# Patient Record
Sex: Female | Born: 1945 | Race: White | Hispanic: No | Marital: Married | Smoking: Never smoker
Health system: Southern US, Community
[De-identification: ages and names within clinical notes are randomized; demographics above are authoritative.]

## PROBLEM LIST (undated history)

## (undated) DIAGNOSIS — E119 Type 2 diabetes mellitus without complications: Secondary | ICD-10-CM

## (undated) DIAGNOSIS — M1990 Unspecified osteoarthritis, unspecified site: Secondary | ICD-10-CM

## (undated) DIAGNOSIS — E559 Vitamin D deficiency, unspecified: Secondary | ICD-10-CM

## (undated) DIAGNOSIS — I10 Essential (primary) hypertension: Secondary | ICD-10-CM

## (undated) DIAGNOSIS — Z9989 Dependence on other enabling machines and devices: Secondary | ICD-10-CM

## (undated) DIAGNOSIS — E785 Hyperlipidemia, unspecified: Secondary | ICD-10-CM

## (undated) DIAGNOSIS — R739 Hyperglycemia, unspecified: Secondary | ICD-10-CM

## (undated) DIAGNOSIS — I4892 Unspecified atrial flutter: Secondary | ICD-10-CM

## (undated) DIAGNOSIS — G629 Polyneuropathy, unspecified: Secondary | ICD-10-CM

## (undated) DIAGNOSIS — J45909 Unspecified asthma, uncomplicated: Secondary | ICD-10-CM

## (undated) DIAGNOSIS — J302 Other seasonal allergic rhinitis: Secondary | ICD-10-CM

## (undated) DIAGNOSIS — R011 Cardiac murmur, unspecified: Secondary | ICD-10-CM

## (undated) DIAGNOSIS — R32 Unspecified urinary incontinence: Secondary | ICD-10-CM

## (undated) DIAGNOSIS — R0602 Shortness of breath: Secondary | ICD-10-CM

## (undated) DIAGNOSIS — C679 Malignant neoplasm of bladder, unspecified: Secondary | ICD-10-CM

## (undated) DIAGNOSIS — Z8679 Personal history of other diseases of the circulatory system: Secondary | ICD-10-CM

## (undated) DIAGNOSIS — K5790 Diverticulosis of intestine, part unspecified, without perforation or abscess without bleeding: Secondary | ICD-10-CM

## (undated) DIAGNOSIS — Z87828 Personal history of other (healed) physical injury and trauma: Secondary | ICD-10-CM

## (undated) DIAGNOSIS — G4733 Obstructive sleep apnea (adult) (pediatric): Secondary | ICD-10-CM

## (undated) DIAGNOSIS — Z8709 Personal history of other diseases of the respiratory system: Secondary | ICD-10-CM

## (undated) DIAGNOSIS — I8390 Asymptomatic varicose veins of unspecified lower extremity: Secondary | ICD-10-CM

## (undated) DIAGNOSIS — C801 Malignant (primary) neoplasm, unspecified: Secondary | ICD-10-CM

## (undated) DIAGNOSIS — Z974 Presence of external hearing-aid: Secondary | ICD-10-CM

## (undated) HISTORY — DX: Vitamin D deficiency, unspecified: E55.9

## (undated) HISTORY — PX: TRANSURETHRAL RESECTION OF BLADDER TUMOR: SHX2575

## (undated) HISTORY — PX: COLONOSCOPY: SHX174

## (undated) HISTORY — DX: Other seasonal allergic rhinitis: J30.2

## (undated) HISTORY — DX: Hyperlipidemia, unspecified: E78.5

## (undated) HISTORY — PX: CYSTOSCOPY: SUR368

## (undated) HISTORY — DX: Diverticulosis of intestine, part unspecified, without perforation or abscess without bleeding: K57.90

## (undated) HISTORY — DX: Unspecified asthma, uncomplicated: J45.909

## (undated) HISTORY — DX: Asymptomatic varicose veins of unspecified lower extremity: I83.90

---

## 1986-03-02 HISTORY — PX: VAGINAL HYSTERECTOMY: SHX2639

## 1988-10-31 HISTORY — PX: ABDOMINAL HYSTERECTOMY: SHX81

## 1998-06-27 ENCOUNTER — Other Ambulatory Visit: Admission: RE | Admit: 1998-06-27 | Discharge: 1998-06-27 | Payer: Self-pay | Admitting: Obstetrics and Gynecology

## 1999-06-30 ENCOUNTER — Other Ambulatory Visit: Admission: RE | Admit: 1999-06-30 | Discharge: 1999-06-30 | Payer: Self-pay | Admitting: Obstetrics and Gynecology

## 2000-06-21 ENCOUNTER — Encounter: Payer: Self-pay | Admitting: Family Medicine

## 2000-06-21 ENCOUNTER — Encounter: Admission: RE | Admit: 2000-06-21 | Discharge: 2000-06-21 | Payer: Self-pay | Admitting: Family Medicine

## 2000-07-14 ENCOUNTER — Other Ambulatory Visit: Admission: RE | Admit: 2000-07-14 | Discharge: 2000-07-14 | Payer: Self-pay | Admitting: Obstetrics and Gynecology

## 2001-08-19 ENCOUNTER — Other Ambulatory Visit: Admission: RE | Admit: 2001-08-19 | Discharge: 2001-08-19 | Payer: Self-pay | Admitting: Obstetrics and Gynecology

## 2002-08-01 DIAGNOSIS — Z8551 Personal history of malignant neoplasm of bladder: Secondary | ICD-10-CM

## 2002-08-01 HISTORY — DX: Personal history of malignant neoplasm of bladder: Z85.51

## 2002-08-09 ENCOUNTER — Encounter (INDEPENDENT_AMBULATORY_CARE_PROVIDER_SITE_OTHER): Payer: Self-pay | Admitting: Specialist

## 2002-08-09 ENCOUNTER — Ambulatory Visit (HOSPITAL_BASED_OUTPATIENT_CLINIC_OR_DEPARTMENT_OTHER): Admission: RE | Admit: 2002-08-09 | Discharge: 2002-08-09 | Payer: Self-pay | Admitting: Urology

## 2002-08-09 HISTORY — PX: CYSTOSTOMY W/ BLADDER BIOPSY: SHX1431

## 2006-05-10 ENCOUNTER — Ambulatory Visit: Payer: Self-pay | Admitting: Emergency Medicine

## 2006-06-09 ENCOUNTER — Ambulatory Visit: Payer: Self-pay | Admitting: Emergency Medicine

## 2006-06-18 ENCOUNTER — Ambulatory Visit: Payer: Self-pay | Admitting: Emergency Medicine

## 2007-01-04 DIAGNOSIS — R05 Cough: Secondary | ICD-10-CM

## 2007-01-04 DIAGNOSIS — J309 Allergic rhinitis, unspecified: Secondary | ICD-10-CM | POA: Insufficient documentation

## 2007-01-04 DIAGNOSIS — E785 Hyperlipidemia, unspecified: Secondary | ICD-10-CM

## 2007-01-04 DIAGNOSIS — R059 Cough, unspecified: Secondary | ICD-10-CM | POA: Insufficient documentation

## 2007-01-04 DIAGNOSIS — Z9079 Acquired absence of other genital organ(s): Secondary | ICD-10-CM | POA: Insufficient documentation

## 2007-01-04 DIAGNOSIS — J45909 Unspecified asthma, uncomplicated: Secondary | ICD-10-CM | POA: Insufficient documentation

## 2007-09-05 DIAGNOSIS — R0989 Other specified symptoms and signs involving the circulatory and respiratory systems: Secondary | ICD-10-CM

## 2007-09-05 DIAGNOSIS — K5730 Diverticulosis of large intestine without perforation or abscess without bleeding: Secondary | ICD-10-CM | POA: Insufficient documentation

## 2008-03-02 DIAGNOSIS — I447 Left bundle-branch block, unspecified: Secondary | ICD-10-CM

## 2008-03-02 HISTORY — DX: Left bundle-branch block, unspecified: I44.7

## 2008-03-21 DIAGNOSIS — I872 Venous insufficiency (chronic) (peripheral): Secondary | ICD-10-CM | POA: Insufficient documentation

## 2008-05-08 ENCOUNTER — Ambulatory Visit: Payer: Self-pay | Admitting: Cardiology

## 2008-05-08 LAB — CONVERTED CEMR LAB
BUN: 17 mg/dL (ref 6–23)
CO2: 30 meq/L (ref 19–32)
Calcium: 9.1 mg/dL (ref 8.4–10.5)
Chloride: 103 meq/L (ref 96–112)
Creatinine, Ser: 0.7 mg/dL (ref 0.4–1.2)
GFR calc Af Amer: 108 mL/min
GFR calc non Af Amer: 90 mL/min
Glucose, Bld: 91 mg/dL (ref 70–99)
Magnesium: 2.1 mg/dL (ref 1.5–2.5)
Potassium: 3.5 meq/L (ref 3.5–5.1)
Sodium: 141 meq/L (ref 135–145)

## 2008-05-16 ENCOUNTER — Ambulatory Visit: Payer: Self-pay

## 2008-05-16 ENCOUNTER — Encounter: Payer: Self-pay | Admitting: Cardiology

## 2008-05-28 DIAGNOSIS — C679 Malignant neoplasm of bladder, unspecified: Secondary | ICD-10-CM | POA: Insufficient documentation

## 2008-05-28 DIAGNOSIS — I119 Hypertensive heart disease without heart failure: Secondary | ICD-10-CM

## 2008-08-01 ENCOUNTER — Ambulatory Visit (HOSPITAL_COMMUNITY): Admission: RE | Admit: 2008-08-01 | Discharge: 2008-08-01 | Payer: Self-pay | Admitting: Cardiology

## 2008-08-01 ENCOUNTER — Ambulatory Visit: Payer: Self-pay | Admitting: Cardiology

## 2008-08-01 DIAGNOSIS — R0602 Shortness of breath: Secondary | ICD-10-CM

## 2008-08-01 DIAGNOSIS — R002 Palpitations: Secondary | ICD-10-CM | POA: Insufficient documentation

## 2008-08-06 ENCOUNTER — Encounter: Payer: Self-pay | Admitting: Cardiology

## 2008-08-06 LAB — CONVERTED CEMR LAB
BUN: 15 mg/dL (ref 6–23)
CO2: 32 meq/L (ref 19–32)
Calcium: 9.3 mg/dL (ref 8.4–10.5)
Chloride: 109 meq/L (ref 96–112)
Creatinine, Ser: 0.7 mg/dL (ref 0.4–1.2)
GFR calc non Af Amer: 89.2 mL/min (ref >60)
Glucose, Bld: 109 mg/dL — ABNORMAL HIGH (ref 70–99)
Potassium: 3.6 meq/L (ref 3.5–5.1)
Sodium: 143 meq/L (ref 135–145)

## 2008-08-13 ENCOUNTER — Encounter: Payer: Self-pay | Admitting: Nurse Practitioner

## 2008-08-31 ENCOUNTER — Telehealth (INDEPENDENT_AMBULATORY_CARE_PROVIDER_SITE_OTHER): Payer: Self-pay | Admitting: *Deleted

## 2009-01-11 ENCOUNTER — Encounter (INDEPENDENT_AMBULATORY_CARE_PROVIDER_SITE_OTHER): Payer: Self-pay | Admitting: *Deleted

## 2009-01-17 ENCOUNTER — Ambulatory Visit: Payer: Self-pay | Admitting: Cardiology

## 2009-01-17 DIAGNOSIS — R079 Chest pain, unspecified: Secondary | ICD-10-CM

## 2009-04-26 DIAGNOSIS — G4700 Insomnia, unspecified: Secondary | ICD-10-CM

## 2009-06-05 ENCOUNTER — Ambulatory Visit: Payer: Self-pay | Admitting: Cardiology

## 2009-06-10 ENCOUNTER — Encounter: Payer: Self-pay | Admitting: Cardiology

## 2009-11-15 DIAGNOSIS — F329 Major depressive disorder, single episode, unspecified: Secondary | ICD-10-CM

## 2009-11-15 DIAGNOSIS — F411 Generalized anxiety disorder: Secondary | ICD-10-CM

## 2009-12-19 ENCOUNTER — Encounter: Payer: Self-pay | Admitting: Cardiology

## 2009-12-19 ENCOUNTER — Ambulatory Visit: Payer: Self-pay | Admitting: Cardiology

## 2009-12-19 DIAGNOSIS — R9431 Abnormal electrocardiogram [ECG] [EKG]: Secondary | ICD-10-CM

## 2009-12-19 DIAGNOSIS — I6522 Occlusion and stenosis of left carotid artery: Secondary | ICD-10-CM | POA: Insufficient documentation

## 2010-01-02 ENCOUNTER — Ambulatory Visit: Payer: Self-pay

## 2010-01-02 ENCOUNTER — Encounter: Payer: Self-pay | Admitting: Cardiology

## 2010-02-17 DIAGNOSIS — M949 Disorder of cartilage, unspecified: Secondary | ICD-10-CM

## 2010-02-17 DIAGNOSIS — E559 Vitamin D deficiency, unspecified: Secondary | ICD-10-CM

## 2010-02-17 DIAGNOSIS — R7301 Impaired fasting glucose: Secondary | ICD-10-CM | POA: Insufficient documentation

## 2010-02-17 DIAGNOSIS — I10 Essential (primary) hypertension: Secondary | ICD-10-CM

## 2010-02-17 DIAGNOSIS — M899 Disorder of bone, unspecified: Secondary | ICD-10-CM

## 2010-03-13 ENCOUNTER — Encounter: Payer: Self-pay | Admitting: Gastroenterology

## 2010-03-23 ENCOUNTER — Encounter: Payer: Self-pay | Admitting: Family Medicine

## 2010-04-03 NOTE — Assessment & Plan Note (Signed)
Summary: f45m   Visit Type:  3 MONTHS FOLLOW UP  CC:  No cardiac complains.  History of Present Illness: Despite the time of year, her symptoms are improved.  She restarted allergy shots when we talked about  it in the fall.  She was having some swelling the feet.    Current Medications (verified): 1)  Allergy Shot .... Once Weekly 2)  Albuterol 90 Mcg/act  Aers (Albuterol) .... Inhale 2 Puffs Every 4 Hrs As Needed 3)  Lipitor 40 Mg Tabs (Atorvastatin Calcium) .... Take One Tablet By Mouth Daily. 4)  Fosamax 70 Mg Tabs (Alendronate Sodium) .... Take One Tablet Once A Week As Directed 5)  Symbicort 160-4.5 Mcg/act Aero (Budesonide-Formoterol Fumarate) .... One Puff in The Morning 6)  Fish Oil 1200 Mg Caps (Omega-3 Fatty Acids) .... Take 1 Capsule By Mouth Once A Day 7)  Hydrochlorothiazide 25 Mg Tabs (Hydrochlorothiazide) .... Take One Tablet By Mouth Daily. 8)  Nasal Saline Wash .... As Needed 9)  Potassium Chloride Cr 10 Meq Cr-Caps (Potassium Chloride) .... Take One Tablet By Mouth Daily 10)  Vitamin D3 2000 Unit Caps (Cholecalciferol) .... Take 1 Capsule By Mouth Once A Day 11)  One-A-Day Calcium Plus 500-100-50 Mg-Unit-Mg Chew (Calcium-Magnesium-Vitamin D) .... Once A Day  Allergies: 1)  ! Codeine  Past History:  Past Medical History: Last updated: 2008-06-24 BLADDER CANCER (ICD-188.9) HYPERTENSION, BENIGN (ICD-401.1) HYPERLIPIDEMIA (ICD-272.4) COUGH, CHRONIC (ICD-786.2) HYSTERECTOMY, HX OF (ICD-V45.77) ASTHMA (ICD-493.90) ALLERGIC RHINITIS (ICD-477.9)  Past Surgical History: Last updated: 2008-06-24 hysterectomy-partial 1985  Family History: Last updated: 2008-06-24 Father: Deceased 18, heart disease, kidney failure Mother:Deceased 70, brain tumor Siblings: 2 sisters- 1 deceased 73 (unknown), 1 alive  Social History: Last updated: 06-24-2008 Retired  Married  Tobacco Use - No.  Alcohol Use - no Regular Exercise - no Drug Use - no  Risk Factors: Exercise:  no (2008-06-24)  Risk Factors: Smoking Status: never (2008-06-24)  Vital Signs:  Patient profile:   65 year old female Height:      68 inches Weight:      173 pounds BMI:     26.40 Pulse rate:   86 / minute Pulse rhythm:   regular Resp:     18 per minute BP sitting:   118 / 76  (left arm) Cuff size:   large  Vitals Entered By: Vikki Ports (June 05, 2009 11:01 AM)  Physical Exam  General:  Well developed, well nourished, in no acute distress. Head:  normocephalic and atraumatic Eyes:  PERRLA/EOM intact; conjunctiva and lids normal. Lungs:  Clear bilaterally to auscultation and percussion. Heart:  Normal S1 and S2.  Without definite murmur. Extremities:  No clubbing or cyanosis. Neurologic:  Alert and oriented x 3.   EKG  Procedure date:  06/05/2009  Findings:      NSR.  LBBB.  Impression & Recommendations:  Problem # 1:  CHEST PAIN-UNSPECIFIED (ICD-786.50)  None whatsoever.  Orders: EKG w/ Interpretation (93000)  Problem # 2:  HYPERLIPIDEMIA (ICD-272.4) Currently on therapy Her updated medication list for this problem includes:    Lipitor 40 Mg Tabs (Atorvastatin calcium) .Marland Kitchen... Take one tablet by mouth daily.  Problem # 3:  ASTHMA (ICD-493.90) symptoms are improved. Her updated medication list for this problem includes:    Albuterol 90 Mcg/act Aers (Albuterol) ..... Inhale 2 puffs every 4 hrs as needed    Symbicort 160-4.5 Mcg/act Aero (Budesonide-formoterol fumarate) ..... One puff in the morning  Patient Instructions: 1)  Your physician recommends  that you continue on your current medications as directed. Please refer to the Current Medication list given to you today. 2)  Your physician wants you to follow-up in:  6 MONTHS.  You will receive a reminder letter in the mail two months in advance. If you don't receive a letter, please call our office to schedule the follow-up appointment.

## 2010-04-03 NOTE — Letter (Signed)
Summary: Colonoscopy Date Change Letter  [facility]  [address] [place], [place] [address]   Phone: [phone]  Fax: [phone]      March 13, 2010 MRN: [id number]   Marcia Norris [address] [place], [place]  [address]   Dear [name],   Previously you were recommended to have a repeat colonoscopy around this time. Your chart was recently reviewed by DR.Sharai Overbay of [facility]. Follow up colonoscopy is now recommended in JAN 2015. This revised recommendation is based on current, nationally recognized guidelines for colorectal cancer screening and polyp surveillance. These guidelines are endorsed by the American Cancer Society, [employer] on Colorectal Cancer as well as numerous other major medical organizations.  Please understand that our recommendation assumes that you do not have any new symptoms such as bleeding, a change in bowel habits, anemia, or significant abdominal discomfort. If you do have any concerning GI symptoms or want to discuss the guideline recommendations, please call to arrange an office visit at your earliest convenience. Otherwise we will keep you in our reminder system and contact you 1-2 months prior to the date listed above to schedule your next colonoscopy.  Thank you,  Barbette Hair. Arlyce Dice, M.D.  [facility] [phone]

## 2010-04-03 NOTE — Assessment & Plan Note (Signed)
Summary: f62m   Visit Type:  6 months follow up Primary Provider:  Elizabeth Palau  CC:  Heart flutte Left arm discomfort.  History of Present Illness: She thinks she is doing ok.  She was not put back on any medication.  Sometimes notes a little bit of swelling.  When she has busy day with young children, she notes a bit of fluttering.  She does not check BP at home.    Current Medications (verified): 1)  Allergy Shot .... Once Weekly 2)  Symbicort 160-4.5 Mcg/act Aero (Budesonide-Formoterol Fumarate) .... Two Times A Day 3)  Lipitor 40 Mg Tabs (Atorvastatin Calcium) .... Take One Tablet By Mouth Daily. 4)  Fish Oil 1200 Mg Caps (Omega-3 Fatty Acids) .... Take 1 Capsule By Mouth Once A Day 5)  Nasal Saline Wash .... As Needed 6)  Vitamin D3 2000 Unit Caps (Cholecalciferol) .... Take 1 Capsule By Mouth Once A Day 7)  One-A-Day Calcium Plus 500-100-50 Mg-Unit-Mg Chew (Calcium-Magnesium-Vitamin D) .... Once A Day 8)  Multivitamins  Tabs (Multiple Vitamin) .... Take 1 Tablet By Mouth Once A Day 9)  Aspirin 81 Mg Tbec (Aspirin) .... About 4 X A Week  Allergies: 1)  ! Codeine  Past History:  Past Medical History: Last updated: 2008/06/06 BLADDER CANCER (ICD-188.9) HYPERTENSION, BENIGN (ICD-401.1) HYPERLIPIDEMIA (ICD-272.4) COUGH, CHRONIC (ICD-786.2) HYSTERECTOMY, HX OF (ICD-V45.77) ASTHMA (ICD-493.90) ALLERGIC RHINITIS (ICD-477.9)  Past Surgical History: Last updated: Jun 06, 2008 hysterectomy-partial 1985  Family History: Last updated: 06-06-08 Father: Deceased 69, heart disease, kidney failure Mother:Deceased 11, brain tumor Siblings: 2 sisters- 1 deceased 19 (unknown), 1 alive  Social History: Last updated: Jun 06, 2008 Retired  Married  Tobacco Use - No.  Alcohol Use - no Regular Exercise - no Drug Use - no  Vital Signs:  Patient profile:   65 year old female Height:      68 inches Weight:      170.50 pounds BMI:     26.02 Pulse rate:   79 / minute Pulse  rhythm:   regular Resp:     18 per minute BP sitting:   134 / 74  (left arm) Cuff size:   large  Vitals Entered By: Vikki Ports (December 19, 2009 10:11 AM)  Physical Exam  General:  Well developed, well nourished, in no acute distress. Head:  normocephalic and atraumatic Eyes:  PERRLA/EOM intact; conjunctiva and lids normal. Neck:  Soft r carotid bruit.  Lungs:  Clear bilaterally to auscultation and percussion. Heart:  Paradoxical S2.  Minimal SEM. Pulses:  pulses normal in all 4 extremities Extremities:  No clubbing or cyanosis. Neurologic:  Alert and oriented x 3.   EKG  Procedure date:  12/19/2009  Findings:      NSR. LBBB  Impression & Recommendations:  Problem # 1:  CAROTID BRUIT (ICD-785.9)  Very soft R bruit.  Will check dopplers.    Orders: EKG w/ Interpretation (93000) Carotid Duplex (Carotid Duplex)  Problem # 2:  HYPERLIPIDEMIA (ICD-272.4) HDL 39 on LLT.  A1C is elevated.  LDL near target.   Her updated medication list for this problem includes:    Lipitor 40 Mg Tabs (Atorvastatin calcium) .Marland Kitchen... Take one tablet by mouth daily.  Problem # 3:  ABNORMAL EKG (ICD-794.31) LBBB on ecg.  Normal LV function overall by echo, and no perfusion defect.   Her updated medication list for this problem includes:    Aspirin 81 Mg Tbec (Aspirin) .Marland Kitchen... About 4 x a week  Patient Instructions: 1)  Your physician  recommends that you continue on your current medications as directed. Please refer to the Current Medication list given to you today. 2)  Your physician has requested that you have a carotid duplex. This test is an ultrasound of the carotid arteries in your neck. It looks at blood flow through these arteries that supply the brain with blood. Allow one hour for this exam. There are no restrictions or special instructions. 3)  Please watch the salt in your diet.  4)  Your physician wants you to follow-up in: 1 YEAR.  You will receive a reminder letter in the mail two  months in advance. If you don't receive a letter, please call our office to schedule the follow-up appointment.

## 2010-04-03 NOTE — Letter (Signed)
Summary: Media Authorization Form  Media Authorization Form   Imported By: Marylou Mccoy 07/19/2009 14:06:37  _____________________________________________________________________  External Attachment:    Type:   Image     Comment:   External Document

## 2010-06-17 DIAGNOSIS — M545 Low back pain, unspecified: Secondary | ICD-10-CM | POA: Insufficient documentation

## 2010-07-15 NOTE — Assessment & Plan Note (Signed)
[name]                            CARDIOLOGY OFFICE NOTE   Marcia Norris, Marcia Norris                      MRN:          [id number]  DATE:05/09/2008                            DOB:          [birth year]    CHIEF COMPLAINT:  Persistent left arm tingling and heaviness.   HISTORY OF PRESENT ILLNESS:  Marcia Norris is a 65 year old female known to me  through the care I take of her husband.  She has requested to see [name].  The patient for while has had some numbness and tingling in the hands.  The distribution of the numbness and tingling appears to be in the first  three fingers, and she readily reports this.  She was placed on a carpal  tunnel splint, and has had largely some resolution of her symptoms.  She  has had some discomfort related to the low back, and has been placed on  Naprosyn by Dr. Newell Coral.  In addition to this, she has had some  discomfort up in the left shoulder and some numbness and tingling  radiating down the left arm.  Because of this, she was concerned, and  subsequently sent for possible cardiac evaluation.  Of note, the patient  also has a history of hypertension, and was placed on  hydrochlorothiazide.  Her major complaint, however, in our understanding  of why she is taking this is that she has some edema in the lower  extremities.  She does not have a typical exertional symptoms.  She was  told that her electrocardiogram was unremarkable.  She has had some very  mild shortness of breath associated with activity, but not a great  increase in pattern from which she has had in the past.  As a result,  she has been referred for further evaluation.  She has not had  significant orthopnea.  She has had mild edema in the lower extremities.   PAST MEDICAL HISTORY:  1. The patient has a history now suggestive of hypertension, treated      with hydrochlorothiazide.  2. Hypercholesterolemia, on lipid lowering therapy.  3. History of bladder  cancer with hematuria and has been followed now      for 5 years with Dr. Isabel Caprice, she had resection.  4. History of hysterectomy.   ALLERGIES:  CODEINE.   MEDICATIONS:  1. Alendronate 70 mg daily.  2. Enteric-coated aspirin 81 mg daily.  3. Calcium D 600 mg p.o. b.i.d.  4. Hydrochlorothiazide 25 mg daily.  5. Lipitor 40 mg daily.  6. Multivitamin daily.  7. Vitamin D daily.   FAMILY HISTORY:  Mother died of brain tumor at 67.  Father had heart  failure and renal insufficiency.  He was a smoker.  He died of 27.  She  has a sister who died at 93 of unknown cause.  She has another sister,  who is in reasonable health.   SOCIAL HISTORY:  The patient is married.  She is a nonsmoker.  She has 2  children.  She is a retired Medical laboratory scientific officer.   REVIEW OF SYSTEMS:  Her eyes and  vision have been satisfactory.  She has  not had cataracts or glaucoma.  Her hearing has been intact.  She has  not had cough, fever, or pleurisy.  She has not had significant  orthopnea.  She has had some lower extremity edema.  She has not had  tachycardia, palpitations, or presyncope.  The patient has no abdominal  complaints.  There has been no melena or hematemesis.  There has been no  medications.  Since treatment for her urinary problem, she has not had  significant bladder symptoms.  She has had some lower extremity edema.  She has not had rash.  There had been no specific neurologic symptoms.  A complete 10-point review of systems is unremarkable.   PHYSICAL EXAMINATION:  GENERAL:  She is alert and oriented in no acute  distress.  She has normal mentation.  She is 5 feet 8 inches, 173  pounds, the temperature is afebrile, respiration is 18, blood pressure  120/78 and equal in both arms bilaterally, pulse is 64 and regular.  HEENT:  The extraocular muscles are intact.  Pupils are equal, round,  and reactive.  NECK:  Relatively supple.  No carotid bruits are appreciated.  They does  appear  to be very mild jugular venous distention but it is hard to be  sure.  Overall estimate would be about 8 cm of water.  LUNG:  Fields are clear to auscultation and percussion.  The PMI is  nondisplaced.  There is a paradoxical splitting of the second heart  sound without significant murmur, rub, or gallop.  ABDOMEN:  Soft without any obvious hepatosplenomegaly noted.  EXTREMITIES:  Femoral pulses are intact.  Joints are without erythema.  The extremities do reveal trace edema, and venous stasis changes,  particularly at the feet.  Distal pulses are intact.  NEUROLOGIC:  Intact cranial nerves.  She has good strength which is  equal bilaterally.  She clearly points to the first three and a half  fingers is the location where numbness in her hands.   EKG reveals normal sinus rhythm.  There is an equivocal left bundle  branch block.  The patient says she was previously told EKG was normal.   IMPRESSION:  1. Atypical left upper chest and arm tingling.  2. 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  3. Left bundle branch block of uncertain duration.  4. Mild lower extremity edema.  5. Hypertension, recently started on hydrochlorothiazide.  6. Hypercholesterolemia, on lipid lowering therapy.   RECOMMENDATIONS:  1. We will obtain the old EKG to see if the left bundle branch block      is persistent.  2. I would recommend an adenosine Myoview as this may be the only way      to [name] evaluate her symptoms.  They are atypical, but she does have      left bundle branch block at baseline, this will make routine      exercise tolerance testing clearly unreliable.  3. With mild jugular venous distention suggested possibly by      examination, and some lower extremity edema with left bundle branch      block, and a 2-D echocardiogram will be obtained to exclude      underlying cardiomyopathic process.  4. With the recent onset of hydrochlorothiazide, we will check a basic      metabolic profile, and       magnesium.  5. She will return to clinic in 2-3 weeks for followup and we will  call her regarding the results.      Marcia Norris. Riley Kill, MD, [facility]  Electronically Signed    TDS/MedQ  DD: 05/09/2008  DT: 05/09/2008  Job #: [id number]   cc:   Vivia Birmingham, FNP-PC, MSN  Teena Irani. Arlyce Dice, M.D.

## 2010-07-18 NOTE — Assessment & Plan Note (Signed)
[facility]                             PULMONARY OFFICE NOTE   ALEE, GRESSMAN                      MRN:          [id number]  DATE:06/18/2006                            DOB:          1945-04-10    SUBJECTIVE:  [name] is a 65 year old woman who was seen in early  March for chronic cough and possible asthma.  She is a never-smoker with  a history of allergic rhinitis and positive skin testing.  She is on  immunotherapy.  Since our last visit, we have performed pulmonary  function testing and she is here to review the results of these today.  She tells me that she continues to have a hacky cough, although, it may  be a bit better than it was at our last visit.  Initially her cough was  much better, but now she is having some worsening of her postnasal drip  since allergy season has started.  She has been restarted on weekly  immunotherapy.  Her home is currently being tested for possible exposure  allergens.  Of note, she is on Fosamax once weekly which can contribute  to cough.   CURRENT MEDICATIONS:  1. Lipitor 20 mg daily.  2. Fosamax 70 mg weekly.  3. Immunotherapy injections once weekly.  4. Nasal saline washes daily.  5. Claritin 10 mg daily.  6. Flonase two sprays each nostril daily.   PHYSICAL EXAMINATION:  GENERAL:  This is a well-appearing woman in no  distress.  VITAL SIGNS:  Weight is 171 pounds, temperature 98.1, blood pressure  124/64, heart rate 89, SPO2 95% on room air.  NECK:  Supple without lymphadenopathy or stridor.  LUNGS:  Clear to auscultation bilaterally.  HEART:  Regular rate and rhythm without murmur.  ABDOMEN:  Benign.  EXTREMITIES:  No cyanosis, clubbing, or edema.   Pulmonary function testing was performed on June 09, 2006.  This showed  grossly normal spirometry by strict criteria, but the shape of her flow  volume loop and her FEF25-75% both suggest mild air/fluid limitation.  She did not have a clear  bronchodilator response.  Her lung volumes were  normal and her diffusion capacity was normal.   IMPRESSION:  1. Chronic cough.  2. Allergic rhinitis.  3. Possible air/fluid limitations suggested by her spirometry.   PLAN:  1. Albuterol to be used p.r.n.  I will not restart her Advair given      her grossly normal spirometry and given concerns that this may      actually irritate her posterior pharynx and make her cough worse.  2. She will continue her weekly allergy regimen.  3. I would like to initiate a trial off of Fosamax to see if this      helps her cough.  4. Ms. Sperl will follow up with me on an as-needed basis, otherwise      she will see Dr. [place] Callas, who has been managing her allergies and      cough to this point.     Leslye Peer, MD  Electronically Signed    RSB/MedQ  DD: 08/01/2006  DT: 08/02/2006  Job #: [id number]   cc:   Sidney Ace, M.D. [facility]

## 2010-07-18 NOTE — Op Note (Signed)
   NAME:  Marcia Norris, CASTELLANOS                         ACCOUNT NO.:  [id number]   MEDICAL RECORD NO.:  [id number]                   PATIENT TYPE:  AMB   LOCATION:  [facility]                                 FACILITY:  [facility]   PHYSICIAN:  Valetta Fuller, M.D.               DATE OF BIRTH:  1945/05/14   DATE OF PROCEDURE:  DATE OF DISCHARGE:                                 OPERATIVE REPORT   PREOPERATIVE DIAGNOSIS:  Bladder tumor.   POSTOPERATIVE DIAGNOSIS:  Bladder tumor.   PROCEDURE:  Cystoscopy with biopsy and fulguration.   SURGEON:  Valetta Fuller, M.D.   ANESTHESIA:  General mask.   INDICATIONS FOR PROCEDURE:  Mr. Arletha Grippe is a 65 year old female. She had some  persistent microscopic hematuria. 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 She presents now for biopsy and fulguration of this lesion as well  as careful assessment of her urothelium.   TECHNIQUE:  The patient was brought to the operating room where she had  successful induction of general mask anesthesia. She was prepped and draped  in the usual manner. Cystoscopy revealed unremarkable anterior urethra.  Again we noticed a small exophytic tumor under the dome of her bladder. In  addition, there was one 3-4 mm area of increased erythema on the left  lateral wall of her bladder. The small tumor was cold cup biopsied and the  small area on the lateral wall was also biopsied. Both these areas were  fulgurated. Given the very small nature of this tumor, I did not feel that  intravesical chemotherapy was necessary. The patient appeared to tolerate  the procedure well and the urine was clear upon stopping the procedure. She  was brought to the recovery room in stable condition.                                               Valetta Fuller, M.D.    DSG/MEDQ  D:  08/09/2002  T:  08/09/2002  Job:  [id number]   cc:   Teena Irani. Arlyce Dice, M.D.  [address]  [place]  [place] [address]  Fax: [phone]

## 2010-07-18 NOTE — Assessment & Plan Note (Signed)
[facility]                             PULMONARY OFFICE NOTE   Marcia, Norris                      MRN:          [id number]  DATE:05/10/2006                            DOB:          1945-11-01    REASON FOR CONSULTATION:  This is a self-referral by Marcia Norris for  chronic cough and possible asthma.   HISTORY:  Marcia Norris is a 65 year old never smoker with a history of  allergic rhinitis and positive skin testing on immunotherapy.  She also  has chronic cough and has been diagnosed with possible asthma.  She  tells me that her allergies were originally diagnosed in the 1980's by  Dr. [place] Callas with positive skin testing.  She was having symptoms  characterized by nasal drainage, sneezing, itchy eyes, etc.  She also  had significant cough that has been nonproductive and which appears to  be bothersome whenever her allergies are exacerbated.  She has been seen  with frequent episodes of wheezing and associated cough.  In the setting  of one of these exacerbations she was diagnosed with probable asthma in  the mid 1990's.  She was started on Advair about 4-5 years ago.  She has  been intermittently treated from time to time in the setting of  exacerbations with steroids and antibiotics.  She tells me that she was  doing well until December of 2007.  At that time she had an upper  respiratory infection characterized by nasal drainage, cough, etc.  She  has had persistent dry cough since that time even with resolution of her  cold symptoms.  The cough is slowly improving but it is still present.  She sometimes get cough and wheeze at night as well.  She has been using  allergy shots every other week.  In the past, she has been treated by  Dr. [place] Callas on a weekly basis.  She denies any GERD symptoms.  She does  have nasal congestion and drainage whenever she is exposed to strong  smells including perfumes, smoke, etc.  She presents today to discuss  her  cough and the possibility that she has asthma and needs her  medications adjusted.   PAST MEDICAL HISTORY:  1. Allergic rhinitis.  2. Status post hysterectomy in 1988.  3. Asthma diagnosed in 1996.  4. She has not had pulmonary function testing.   ALLERGIES:  CODEINE CAUSED NAUSEA.   CURRENT MEDICATIONS:  1. Lipitor 20 mg daily.  2. Advair 250/50 one inhalation b.i.d.  3. Fosamax once weekly.  4. Allergy shots every other week.  5. Astelin spray, one spray in each nostril daily.  6. Nasal saline washes b.i.d.  7. Albuterol 2 puffs q.4h. p.r.n.  8. Claritin 10 mg daily p.r.n.  9. Flonase 2 sprays each nostril daily p.r.n.   SOCIAL HISTORY:  1. She is a never smoker.  2. She is married.  3. She lives in [place].  4. She is a retired Production designer, theatre/television/film.  5. She denies any significant occupational exposure.   FAMILY HISTORY:  Significant for:  1. Coronary artery disease.  2.  Brain cancer.   REVIEW OF SYSTEMS:  As per the HPI.   EXAM:  GENERAL:  This is a well-appearing woman in no distress.  Her  weight is 172 pounds, temperature 98.1, blood pressure 124/80, heart  rate 69, SPO2 98% on room air.  HEENT:  The oropharynx is clear.  There is no posterior pharyngeal  erythema.  She may have some very mild postnasal drip.  NECK:  Without stridor or lymphadenopathy.  LUNGS:  Clear to auscultation bilaterally.  HEART:  Regular rate and rhythm without murmur.  ABDOMEN:  Benign.  EXTREMITIES:  No cyanosis, clubbing or edema.  NEUROLOGIC:  She has a nonfocal exam.   Allergy testing performed in 2005 is available, shows POSITIVE REACTIONS  TO DUST MITES, HOUSEHOLD DUST, ASPERGILLUS AND MOLD MIX.   IMPRESSION:  1. Allergic rhinitis not currently on maintenance therapy.  2. Chronic cough, likely being exacerbated by the above.  One must      also consider the possibility of an atypical presentation of asthma      although I believe that her symptoms are most consistent with upper       airway irritation due to her allergic rhinitis.   PLANS:  1. I will hold her Advair for now.  2. She will continue her nasal saline washes b.i.d.  3. She will start using her Flonase and Claritin on a scheduled basis,      instead of p.r.n., to attempt to better treat her allergic      symptoms.  4. She may need to go back to using her immunotherapy q.week, instead      of every other week, depending on her response to the above.  5. Full pulmonary function testing to determine whether or not she      truly has lower airways      disease and airflow limitation.  6. I will follow up with Marcia Norris in 1 month to review the results      of the above.     Leslye Peer, MD  Electronically Signed    RSB/MedQ  DD: 05/10/2006  DT: 05/10/2006  Job #: [id number]   cc:   Sidney Ace, M.D. [facility]

## 2011-01-30 ENCOUNTER — Other Ambulatory Visit: Payer: Self-pay | Admitting: Cardiology

## 2011-01-30 DIAGNOSIS — I6529 Occlusion and stenosis of unspecified carotid artery: Secondary | ICD-10-CM

## 2011-02-03 ENCOUNTER — Ambulatory Visit: Payer: Self-pay | Admitting: Cardiology

## 2011-02-03 ENCOUNTER — Encounter: Payer: Self-pay | Admitting: *Deleted

## 2011-02-13 ENCOUNTER — Encounter (INDEPENDENT_AMBULATORY_CARE_PROVIDER_SITE_OTHER): Payer: BC Managed Care – PPO | Admitting: *Deleted

## 2011-02-13 DIAGNOSIS — R0989 Other specified symptoms and signs involving the circulatory and respiratory systems: Secondary | ICD-10-CM

## 2011-02-13 DIAGNOSIS — I6529 Occlusion and stenosis of unspecified carotid artery: Secondary | ICD-10-CM

## 2011-03-17 ENCOUNTER — Telehealth: Payer: Self-pay | Admitting: Cardiology

## 2011-03-17 NOTE — Telephone Encounter (Signed)
03/17/11--LMTCB--nt

## 2011-03-17 NOTE — Telephone Encounter (Signed)
Fu call °Patient returning your call °

## 2011-03-17 NOTE — Telephone Encounter (Signed)
03/17/11--results of carotids given to pt--nt

## 2011-03-30 ENCOUNTER — Encounter: Payer: Self-pay | Admitting: Cardiology

## 2011-03-30 ENCOUNTER — Ambulatory Visit (INDEPENDENT_AMBULATORY_CARE_PROVIDER_SITE_OTHER): Payer: Medicare Other | Admitting: Cardiology

## 2011-03-30 DIAGNOSIS — R011 Cardiac murmur, unspecified: Secondary | ICD-10-CM

## 2011-03-30 DIAGNOSIS — R259 Unspecified abnormal involuntary movements: Secondary | ICD-10-CM

## 2011-03-30 DIAGNOSIS — E785 Hyperlipidemia, unspecified: Secondary | ICD-10-CM

## 2011-03-30 DIAGNOSIS — R9431 Abnormal electrocardiogram [ECG] [EKG]: Secondary | ICD-10-CM

## 2011-03-30 DIAGNOSIS — R251 Tremor, unspecified: Secondary | ICD-10-CM | POA: Insufficient documentation

## 2011-03-30 DIAGNOSIS — R0989 Other specified symptoms and signs involving the circulatory and respiratory systems: Secondary | ICD-10-CM

## 2011-03-30 NOTE — Assessment & Plan Note (Signed)
Doubt this is of significance.  However, given new finding, echocardiography is indicated.

## 2011-03-30 NOTE — Assessment & Plan Note (Signed)
Followed by primary care.   

## 2011-03-30 NOTE — Progress Notes (Signed)
   HPI: She is doing well.  No major issues.  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  Her family has also noted this.  No progressive symptoms.    Current Outpatient Prescriptions  Medication Sig Dispense Refill  . aspirin 81 MG tablet Take 81 mg by mouth daily.        [name]        Marland Kitchen azelastine (ASTELIN) 137 MCG/SPRAY nasal spray Place 1 spray into the nose as needed.       . budesonide-formoterol (SYMBICORT) 160-4.5 MCG/ACT inhaler Inhale 2 puffs into the lungs 2 (two) times daily.        . cholecalciferol (VITAMIN D) 400 UNITS TABS Take 1,000 Units by mouth daily.        . Multiple Vitamins-Minerals (ONE-A-DAY 50 PLUS PO) Take 1 tablet by mouth daily.        Marland Kitchen NASAL SALINE NA Place into the nose.        . NONFORMULARY/COMPOUNDED ITEM Inject 1 each as directed once a week.        . Omega-3 Fatty Acids (FISH OIL) 1200 MG CAPS Take 1 capsule by mouth daily.        Marland Kitchen PROAIR HFA 108 (90 BASE) MCG/ACT inhaler Inhale 1 puff into the lungs every 4 (four) hours as needed.         Allergies  Allergen Reactions  . Codeine     No past medical history on file.  No past surgical history on file.  No family history on file.  History   Social History  . Marital Status: Married    Spouse Name: N/A    Number of Children: N/A  . Years of Education: N/A   Occupational History  . Not on file.   Social History Main Topics  . Smoking status: Never Smoker   . Smokeless tobacco: Not on file  . Alcohol Use: Not on file  . Drug Use: Not on file  . Sexually Active: Not on file   Other Topics Concern  . Not on file   Social History Narrative  . No narrative on file    ROS: Please see the HPI.  All other systems reviewed and negative.  PHYSICAL EXAM:  BP 128/82  Pulse 70  Ht 5\' 8"  (1.727 m)  Wt 79.434 kg (175 lb 1.9 oz)  BMI 26.63  kg/m2  General: Well developed, well nourished, in no acute distress. Head:  Normocephalic and atraumatic. Neck: no JVD.   Lungs: Clear to auscultation and percussion. Heart: Normal S1 and S2. PMI non displaced.  She does have a 2/6 SEM ULSE.  LLSE no murmur.  Apex with soft apical murmur  2/6 Abdomen:  Normal bowel sounds; soft; non tender; no organomegaly Pulses: Pulses normal in all 4 extremities. Extremities: No clubbing or cyanosis. No edema. Neurologic: Alert and oriented x 3.  EKG:  NSR.  LBBB.  No change.   ASSESSMENT AND PLAN:

## 2011-03-30 NOTE — Assessment & Plan Note (Signed)
Not present today on exam.  Suggested she follow up with primary regarding ? Neuro evaluation.

## 2011-03-30 NOTE — Patient Instructions (Signed)
Your physician has requested that you have an echocardiogram. Echocardiography is a painless test that uses sound waves to create images of your heart. It provides your doctor with information about the size and shape of your heart and how well your heart's chambers and valves are working. This procedure takes approximately one hour. There are no restrictions for this procedure.  Your physician wants you to follow-up in: 1 YEAR You will receive a reminder letter in the mail two months in advance. If you don't receive a letter, please call our office to schedule the follow-up appointment.   Your physician recommends that you continue on your current medications as directed. Please refer to the Current Medication list given to you today.  

## 2011-03-30 NOTE — Assessment & Plan Note (Signed)
Negative study with regard to significant obstruction.

## 2011-04-01 ENCOUNTER — Ambulatory Visit: Payer: Self-pay | Admitting: Cardiology

## 2011-04-03 ENCOUNTER — Ambulatory Visit (HOSPITAL_COMMUNITY): Payer: Medicare Other | Attending: Cardiology

## 2011-04-03 DIAGNOSIS — E785 Hyperlipidemia, unspecified: Secondary | ICD-10-CM | POA: Insufficient documentation

## 2011-04-03 DIAGNOSIS — R0989 Other specified symptoms and signs involving the circulatory and respiratory systems: Secondary | ICD-10-CM | POA: Insufficient documentation

## 2011-04-03 DIAGNOSIS — R011 Cardiac murmur, unspecified: Secondary | ICD-10-CM | POA: Insufficient documentation

## 2011-04-03 DIAGNOSIS — R9431 Abnormal electrocardiogram [ECG] [EKG]: Secondary | ICD-10-CM

## 2011-04-03 DIAGNOSIS — J45909 Unspecified asthma, uncomplicated: Secondary | ICD-10-CM | POA: Insufficient documentation

## 2011-04-03 DIAGNOSIS — I10 Essential (primary) hypertension: Secondary | ICD-10-CM | POA: Insufficient documentation

## 2011-04-03 DIAGNOSIS — R002 Palpitations: Secondary | ICD-10-CM | POA: Insufficient documentation

## 2011-04-24 DIAGNOSIS — N3941 Urge incontinence: Secondary | ICD-10-CM | POA: Insufficient documentation

## 2011-04-24 DIAGNOSIS — R011 Cardiac murmur, unspecified: Secondary | ICD-10-CM | POA: Insufficient documentation

## 2011-04-24 DIAGNOSIS — R251 Tremor, unspecified: Secondary | ICD-10-CM | POA: Insufficient documentation

## 2011-05-15 ENCOUNTER — Telehealth: Payer: Self-pay | Admitting: Cardiology

## 2011-05-15 NOTE — Telephone Encounter (Signed)
Please return call to patient at hm# [phone]  Patient would like a return call from nurse concerning the results of ECHO, she can be reached at hm# [phone].  Patient confirms it is all right to leave msg with results of test on vm if she is not available.

## 2011-05-15 NOTE — Telephone Encounter (Signed)
Reviewed ECHO results with patient.

## 2011-06-10 ENCOUNTER — Other Ambulatory Visit: Payer: Self-pay | Admitting: Dermatology

## 2012-02-04 ENCOUNTER — Institutional Professional Consult (permissible substitution): Payer: Medicare Other | Admitting: Pulmonary Disease

## 2012-02-16 ENCOUNTER — Encounter: Payer: Self-pay | Admitting: *Deleted

## 2012-02-16 ENCOUNTER — Ambulatory Visit (INDEPENDENT_AMBULATORY_CARE_PROVIDER_SITE_OTHER): Payer: Medicare Other | Admitting: Pulmonary Disease

## 2012-02-16 VITALS — BP 132/64 | HR 71 | Temp 97.6°F | Ht 67.0 in | Wt 175.2 lb

## 2012-02-16 DIAGNOSIS — G4733 Obstructive sleep apnea (adult) (pediatric): Secondary | ICD-10-CM

## 2012-02-16 HISTORY — DX: Obstructive sleep apnea (adult) (pediatric): G47.33

## 2012-02-16 NOTE — Patient Instructions (Signed)
Will arrange for sleep study Will call to arrange for follow up after sleep study reviewed 

## 2012-02-16 NOTE — Progress Notes (Signed)
Chief Complaint  Patient presents with  . Advice Only    c/o snoring and falling asleep during the day, feels sleepy while driving.     History of Present Illness: Marcia Norris is a 66 y.o. female for evaluation of sleep apnea.  She has more trouble with snoring.  She will also wake up with a cough that resolves shortly after waking up.  This has been getting worse over the past few years.  She has been feeling more tired during the day.  She is now having trouble staying awake while driving.  She goes to bed at 930 pm.  She will take an equate 5 out of 7 nights.  This helps her stay asleep.  She wakes up once or twice to use the bathroom.  She gets out of bed at 530 am.  She feels rested in the morning.  She will occasionally get headaches in the morning.  She drinks a cup off coffee in the morning, and this helps get her going.  She will feel sleepy in the afternoon, and tends to fall asleep if she is sitting quiet.  Her Epworth score is 15 out of 24.  The patient denies sleep walking, sleep talking, bruxism, or nightmares.  There is no history of restless legs.  The patient denies sleep hallucinations, sleep paralysis, or cataplexy.   Tests: Echo 04/03/11 >> EF 60 to 65%, grade 1 diastolic dysfx, mild MR  Past Medical History  Diagnosis Date  . Hyperlipidemia   . Vitamin D deficiency   . Asthma   . Seasonal allergies     Past Surgical History  Procedure Date  . Partial hysterectomy     Current Outpatient Prescriptions on File Prior to Visit  Medication Sig Dispense Refill  . aspirin 81 MG tablet Take 81 mg by mouth daily.        [name]        Marland Kitchen azelastine (ASTELIN) 137 MCG/SPRAY nasal spray Place 1 spray into the nose as needed.       . budesonide-formoterol (SYMBICORT) 160-4.5 MCG/ACT inhaler Inhale 2 puffs into the lungs 2 (two) times daily.        . Calcium Carbonate-Vitamin D (CALCIUM 600+D) 600-400 MG-UNIT per  tablet Take 1 tablet by mouth 2 (two) times daily.      . cholecalciferol (VITAMIN D) 400 UNITS TABS Take 1,000 Units by mouth daily.        Marland Kitchen loratadine (CLARITIN) 10 MG tablet Take 10 mg by mouth daily.      . Multiple Vitamins-Minerals (ONE-A-DAY 50 PLUS PO) Take 1 tablet by mouth daily.        Marland Kitchen NASAL SALINE NA Place into the nose.        . Omega-3 Fatty Acids (FISH OIL) 1200 MG CAPS Take 1 capsule by mouth daily.        Marland Kitchen PROAIR HFA 108 (90 BASE) MCG/ACT inhaler Inhale 1 puff into the lungs every 4 (four) hours as needed.         Allergies  Allergen Reactions  . Codeine     Family History  Problem Relation Age of Onset  . Cancer Mother   . Bladder Cancer Father   . Heart disease Father   . Allergies Mother     History  Substance Use Topics  . Smoking status: Never Smoker   . Smokeless tobacco: Not on file  . Alcohol Use:  No    Review of Systems  Constitutional: Negative for appetite change and unexpected weight change.  HENT: Negative for ear pain, congestion, sore throat, sneezing, trouble swallowing and dental problem.   Respiratory: Positive for cough and shortness of breath.   Cardiovascular: Negative for chest pain, palpitations and leg swelling.  Gastrointestinal: Negative for abdominal pain.  Musculoskeletal: Negative for joint swelling.  Skin: Negative for rash.  Neurological: Negative for headaches.  Psychiatric/Behavioral: Negative for dysphoric mood. The patient is nervous/anxious.    Physical Exam: Filed Vitals:   02/16/12 1010 02/16/12 1012  BP:  132/64  Pulse:  71  Temp: 97.6 F (36.4 C)   TempSrc: Oral   Height: 5\' 7"  (1.702 m)   Weight: 175 lb 3.2 oz (79.47 kg)   SpO2:  95%    Current Encounter SPO2  02/16/12 1012 95%    Wt Readings from Last 3 Encounters:  02/16/12 175 lb 3.2 oz (79.47 kg)  03/30/11 175 lb 1.9 oz (79.434 kg)  12/19/09 170 lb 8 oz (77.338 kg)    Body mass index is 27.44 kg/(m^2).   General - No distress ENT - No  sinus tenderness, MP 4, enlarged tongue, low soft palate, no oral exudate, no LAN, no thyromegaly, TM clear, pupils equal/reactive Cardiac - s1s2 regular, no murmur, pulses symmetric Chest - No wheeze/rales/dullness, good air entry, normal respiratory excursion Back - No focal tenderness Abd - Soft, non-tender, no organomegaly, + bowel sounds Ext - No edema Neuro - Normal strength, cranial nerves intact Skin - No rashes Psych - Normal mood, and behavior.   Assessment/Plan:  Coralyn Helling, MD [facility]/Sleep Pager:  [phone] 02/16/2012, 10:18 AM

## 2012-02-16 NOTE — Progress Notes (Deleted)
  Subjective:    Patient ID: Marcia Norris, female    DOB: 1945-12-11, 65 y.o.   MRN: [id number]  HPI    Review of Systems  Constitutional: Negative for appetite change and unexpected weight change.  HENT: Negative for ear pain, congestion, sore throat, sneezing, trouble swallowing and dental problem.   Respiratory: Positive for cough and shortness of breath.   Cardiovascular: Negative for chest pain, palpitations and leg swelling.  Gastrointestinal: Negative for abdominal pain.  Musculoskeletal: Negative for joint swelling.  Skin: Negative for rash.  Neurological: Negative for headaches.  Psychiatric/Behavioral: Negative for dysphoric mood. The patient is nervous/anxious.        Objective:   Physical Exam        Assessment & Plan:

## 2012-02-16 NOTE — Assessment & Plan Note (Signed)
She reports snoring, sleep disruption, and daytime sleepiness.  She has history of diastolic dysfunction and asthma.  I am concerned she could have sleep apnea.  I have explained how sleep apnea can affect the patient's health.  Driving precautions and importance of weight loss were discussed.  Treatment options for sleep apnea were reviewed.  To further assess will arrange for in lab sleep study.

## 2012-03-02 DIAGNOSIS — G4733 Obstructive sleep apnea (adult) (pediatric): Secondary | ICD-10-CM

## 2012-03-02 HISTORY — DX: Obstructive sleep apnea (adult) (pediatric): G47.33

## 2012-03-02 HISTORY — PX: CATARACT EXTRACTION W/ INTRAOCULAR LENS  IMPLANT, BILATERAL: SHX1307

## 2012-03-03 ENCOUNTER — Telehealth: Payer: Self-pay | Admitting: Pulmonary Disease

## 2012-03-03 NOTE — Telephone Encounter (Signed)
Pt wanted to know if her slepp study required precert and is did not Tobe Sos

## 2012-03-08 ENCOUNTER — Ambulatory Visit (HOSPITAL_BASED_OUTPATIENT_CLINIC_OR_DEPARTMENT_OTHER): Payer: Medicare Other | Attending: Pulmonary Disease | Admitting: Radiology

## 2012-03-08 VITALS — Ht 66.5 in | Wt 175.0 lb

## 2012-03-08 DIAGNOSIS — Z7982 Long term (current) use of aspirin: Secondary | ICD-10-CM | POA: Insufficient documentation

## 2012-03-08 DIAGNOSIS — G4733 Obstructive sleep apnea (adult) (pediatric): Secondary | ICD-10-CM | POA: Insufficient documentation

## 2012-03-08 DIAGNOSIS — Z79899 Other long term (current) drug therapy: Secondary | ICD-10-CM | POA: Insufficient documentation

## 2012-03-14 ENCOUNTER — Telehealth: Payer: Self-pay | Admitting: Pulmonary Disease

## 2012-03-14 DIAGNOSIS — G4733 Obstructive sleep apnea (adult) (pediatric): Secondary | ICD-10-CM

## 2012-03-14 NOTE — Telephone Encounter (Signed)
PSG 03/08/12 >> AHI 10.6, SpO2 low 82%, PLMI 1.8.  Will have my nurse schedule ROV to review results.

## 2012-03-15 NOTE — Telephone Encounter (Signed)
lmomtcb x1 

## 2012-03-15 NOTE — Procedures (Signed)
Marcia Norris, Marcia Norris NO.:  [id number]  MEDICAL RECORD NO.:  [id number]          PATIENT TYPE:  OUT  LOCATION:  SLEEP CENTER                 FACILITY:  [facility]  PHYSICIAN:  Coralyn Helling, MD        DATE OF BIRTH:  11/02/1945  DATE OF STUDY:  03/08/2012                           NOCTURNAL POLYSOMNOGRAM  REFERRING PHYSICIAN:  Coralyn Helling, MD  FACILITY:  [facility].  INDICATION FOR STUDY:  [name] is a 67 year old female who has a history of hypertension and asthma.  She also has snoring, sleep disruption, and daytime sleepiness.  She was referred to the Sleep Lab for evaluation of hypersomnia with obstructive sleep apnea.  Height is 5 feet and 6 inches, weight is 175 pounds, BMI is 28, neck size is 15 inches.  EPWORTH SLEEPINESS SCORE:  14.  MEDICATIONS:  Aspirin, Lipitor, Astelin, Symbicort, calcium, vitamin D, Fosamax, Claritin, fish oil, and ProAir.  SLEEP ARCHITECTURE:  Total recording time was 432 minutes, total sleep time was 401 minutes, sleep efficiency was 93%, sleep latency was 8.5 minutes, REM latency was 66.5 minutes.  The patient was observed in all stages of sleep and slept predominantly in the nonsupine position.  RESPIRATORY DATA:  The average respiratory rate was 18.  Moderate snoring was noted by the technician.  The overall apnea/hypopnea index was 10.6.  The events were exclusively obstructive in nature.  OXYGEN DATA:  The baseline oxygenation was 96%.  The oxygen saturation nadir was 82%.  The study was conducted without the use of supplemental oxygen.  CARDIAC DATA:  The average heart rate was 59 and the rhythm strip showed sinus rhythm with occasional PVCs and PACs.  MOVEMENT-PARASOMNIA:  The periodic limb movement index was 1.8 and the patient had no restroom trips.  IMPRESSIONS-RECOMMENDATIONS:  This study shows evidence for mild obstructive sleep apnea with an apnea/hypopnea index of 10.6 and oxygen saturation nadir  of 82%.  In addition to diet, exercise, and weight reduction, additional therapeutic interventions could include CPAP therapy, oral appliance, or surgical intervention.     Coralyn Helling, MD Diplomat, American Board of Sleep Medicine    VS/MEDQ  D:  03/14/2012 12:21:48  T:  03/15/2012 01:27:48  Job:  [id number]

## 2012-03-16 NOTE — Telephone Encounter (Signed)
lmomtcb x 2  

## 2012-03-17 ENCOUNTER — Telehealth: Payer: Self-pay | Admitting: Pulmonary Disease

## 2012-03-17 NOTE — Telephone Encounter (Signed)
Patient returning call.  [phone]

## 2012-03-17 NOTE — Telephone Encounter (Signed)
I spoke with pt. She is scheduled for 03/18/12 at 2:15. Nothing further was needed

## 2012-03-18 ENCOUNTER — Ambulatory Visit (INDEPENDENT_AMBULATORY_CARE_PROVIDER_SITE_OTHER): Payer: Medicare Other | Admitting: Pulmonary Disease

## 2012-03-18 ENCOUNTER — Encounter: Payer: Self-pay | Admitting: Pulmonary Disease

## 2012-03-18 VITALS — BP 140/72 | HR 69 | Temp 97.6°F | Ht 66.5 in | Wt 177.6 lb

## 2012-03-18 DIAGNOSIS — G4733 Obstructive sleep apnea (adult) (pediatric): Secondary | ICD-10-CM

## 2012-03-18 NOTE — Assessment & Plan Note (Signed)
She has mild sleep apnea.  I have reviewed the sleep test results with the patient.  Explained how sleep apnea can affect the patient's health.  Driving precautions and importance of weight loss were discussed.  Treatment options for sleep apnea were reviewed.  Will arrange for auto CPAP set up.

## 2012-03-18 NOTE — Progress Notes (Signed)
Chief Complaint  Patient presents with  . Results    disucss sleep study results    History of Present Illness: Marcia Norris is a 67 y.o. female with OSA.  She is here to review her sleep study.  TESTS: Echo 04/03/11 >> EF 60 to 65%, grade 1 diastolic dysfx, mild MR PSG 03/08/12 >> AHI 10.6, SpO2 low 82%, PLMI 1.8.   Past Medical History  Diagnosis Date  . Hyperlipidemia   . Vitamin D deficiency   . Asthma   . Seasonal allergies     Past Surgical History  Procedure Date  . Partial hysterectomy     Outpatient Encounter Prescriptions as of 03/18/2012  Medication Sig Dispense Refill  . aspirin 81 MG tablet Take 81 mg by mouth daily.        [name]        Marland Kitchen azelastine (ASTELIN) 137 MCG/SPRAY nasal spray Place 1 spray into the nose as needed.       . budesonide-formoterol (SYMBICORT) 160-4.5 MCG/ACT inhaler Inhale 2 puffs into the lungs 2 (two) times daily.        . Calcium Carbonate-Vitamin D (CALCIUM 600+D) 600-400 MG-UNIT per tablet Take 1 tablet by mouth 2 (two) times daily.      . cholecalciferol (VITAMIN D) 400 UNITS TABS Take 1,000 Units by mouth daily.        Marland Kitchen dextromethorphan-guaiFENesin (MUCINEX DM) 30-600 MG per 12 hr tablet Take 1 tablet by mouth daily as needed.       . loratadine (CLARITIN) 10 MG tablet Take 10 mg by mouth daily.      . Multiple Vitamins-Minerals (ONE-A-DAY 50 PLUS PO) Take 1 tablet by mouth daily.        Marland Kitchen NASAL SALINE NA Place into the nose as needed.       . Omega-3 Fatty Acids (FISH OIL) 1200 MG CAPS Take 1 capsule by mouth daily.        Marland Kitchen PROAIR HFA 108 (90 BASE) MCG/ACT inhaler Inhale 1 puff into the lungs every 4 (four) hours as needed.         Allergies  Allergen Reactions  . Codeine     Physical Exam:  Filed Vitals:   03/18/12 1420 03/18/12 1421  BP:  140/72  Pulse:  69  Temp: 97.6 F (36.4 C)   TempSrc: Oral   Height: 5' 6.5" (1.689 m)   Weight: 177 lb 9.6 oz  (80.559 kg)   SpO2:  95%     Current Encounter SPO2  03/18/12 1421 95%  02/16/12 1012 95%     Body mass index is 28.24 kg/(m^2).   Wt Readings from Last 2 Encounters:  03/18/12 177 lb 9.6 oz (80.559 kg)  03/08/12 175 lb (79.379 kg)     General - No distress ENT - No sinus tenderness, MP 4, enlarged tongue, low soft palate, no oral exudate, no LAN, no thyromegaly, TM clear, pupils equal/reactive Cardiac - s1s2 regular, no murmur Chest - No wheeze/rales/dullness Back - No focal tenderness Abd - Soft, non-tender Ext - No edema Neuro - Normal strength Skin - No rashes Psych - normal mood, and behavior   Assessment/Plan:  Coralyn Helling, MD [place] [facility]Critical [facility]/Sleep Pager:  [phone] 03/18/2012, 2:31 PM

## 2012-03-18 NOTE — Patient Instructions (Signed)
Will arrange for CPAP set up at home Follow up in 8 weeks 

## 2012-03-25 NOTE — Telephone Encounter (Signed)
error 

## 2012-04-07 ENCOUNTER — Ambulatory Visit (INDEPENDENT_AMBULATORY_CARE_PROVIDER_SITE_OTHER): Payer: Medicare Other | Admitting: Cardiology

## 2012-04-07 VITALS — BP 134/80 | HR 76 | Ht 66.5 in | Wt 174.0 lb

## 2012-04-07 DIAGNOSIS — G4733 Obstructive sleep apnea (adult) (pediatric): Secondary | ICD-10-CM

## 2012-04-07 DIAGNOSIS — I10 Essential (primary) hypertension: Secondary | ICD-10-CM

## 2012-04-07 DIAGNOSIS — I059 Rheumatic mitral valve disease, unspecified: Secondary | ICD-10-CM

## 2012-04-07 DIAGNOSIS — R0989 Other specified symptoms and signs involving the circulatory and respiratory systems: Secondary | ICD-10-CM

## 2012-04-07 DIAGNOSIS — I340 Nonrheumatic mitral (valve) insufficiency: Secondary | ICD-10-CM

## 2012-04-07 NOTE — Patient Instructions (Addendum)
Your physician wants you to follow-up in: 6 MONTHS with Dr Shirlee Latch.  You will receive a reminder letter in the mail two months in advance. If you don't receive a letter, please call our office to schedule the follow-up appointment.  Your physician has requested that you have an echocardiogram in 6 MONTHS. Echocardiography is a painless test that uses sound waves to create images of your heart. It provides your doctor with information about the size and shape of your heart and how well your heart's chambers and valves are working. This procedure takes approximately one hour. There are no restrictions for this procedure.  Your physician recommends that you continue on your current medications as directed. Please refer to the Current Medication list given to you today.

## 2012-04-07 NOTE — Assessment & Plan Note (Signed)
Now wearing CPAP machine.

## 2012-04-07 NOTE — Assessment & Plan Note (Signed)
Controlled.  

## 2012-04-07 NOTE — Assessment & Plan Note (Signed)
Function is normal. Shows mild MR by echo, although it is audible on exam. I think she'll need periodic followup with repeat echocardiography, and I will make arrangements for her to see Dr. Jearld Pies in followup.

## 2012-04-07 NOTE — Assessment & Plan Note (Signed)
0-39% bilaterally.  No sig obstruction.  Suggested 2 year monitoring.  She will see Dr Shirlee Latch before that.

## 2012-04-07 NOTE — Progress Notes (Signed)
HPI:  The patient is doing extremely well. We had a very nice visit today. I reviewed the results of her echocardiogram in detail. We also reviewed her electrical contraction pattern.  She says she is very active, and very busy. She does have some asthma.  The patient also has mild sleep apnea, and now is using a CPAP machine.  No chest pain.    Current Outpatient Prescriptions  Medication Sig Dispense Refill  . aspirin 81 MG tablet Take 81 mg by mouth daily.        [name]        Marland Kitchen azelastine (ASTELIN) 137 MCG/SPRAY nasal spray Place 1 spray into the nose as needed.       . budesonide-formoterol (SYMBICORT) 160-4.5 MCG/ACT inhaler Inhale 2 puffs into the lungs 2 (two) times daily.        . Calcium Carbonate-Vitamin D (CALCIUM 600+D) 600-400 MG-UNIT per tablet Take 1 tablet by mouth 2 (two) times daily.      . cholecalciferol (VITAMIN D) 400 UNITS TABS Take 1,000 Units by mouth daily.        Marland Kitchen dextromethorphan-guaiFENesin (MUCINEX DM) 30-600 MG per 12 hr tablet Take 1 tablet by mouth daily as needed.       . loratadine (CLARITIN) 10 MG tablet Take 10 mg by mouth daily.      . Multiple Vitamins-Minerals (ONE-A-DAY 50 PLUS PO) Take 1 tablet by mouth daily.        Marland Kitchen NASAL SALINE NA Place into the nose as needed.       . Omega-3 Fatty Acids (FISH OIL) 1200 MG CAPS Take 1 capsule by mouth as needed.       Marland Kitchen PROAIR HFA 108 (90 BASE) MCG/ACT inhaler Inhale 1 puff into the lungs every 4 (four) hours as needed.         Allergies  Allergen Reactions  . Codeine     Past Medical History  Diagnosis Date  . Hyperlipidemia   . Vitamin D deficiency   . Asthma   . Seasonal allergies   . OSA (obstructive sleep apnea) 02/16/2012    Past Surgical History  Procedure Date  . Partial hysterectomy     Family History  Problem Relation Age of Onset  . Cancer Mother   . Bladder Cancer Father   . Heart disease Father   . Allergies Mother      History   Social History  . Marital Status: Married    Spouse Name: N/A    Number of Children: N/A  . Years of Education: N/A   Occupational History  . retired    Social History Main Topics  . Smoking status: Never Smoker   . Smokeless tobacco: Not on file  . Alcohol Use: No  . Drug Use: No  . Sexually Active: Not on file   Other Topics Concern  . Not on file   Social History Narrative  . No narrative on file    ROS: Please see the HPI.  All other systems reviewed and negative.  PHYSICAL EXAM:  BP 134/80  Pulse 76  Ht 5' 6.5" (1.689 m)  Wt 174 lb (78.926 kg)  BMI 27.66 kg/m2  SpO2 96%  General: Well developed, well nourished, in no acute distress. Head:  Normocephalic and atraumatic. Neck: no JVD Lungs: Clear to auscultation and percussion. Heart: Normal S1 and paradoxical S2.  Apical murmur with slight radiation.  Abdomen:  Normal bowel sounds; soft; non tender; no organomegaly Pulses: Pulses normal in all 4 extremities. Extremities: No clubbing or cyanosis. No edema. Neurologic: Alert and oriented x 3.  EKG:  NSR.  LBBB.  No change from prior tracing one year earlier.    ASSESSMENT AND PLAN:

## 2012-04-14 ENCOUNTER — Encounter: Payer: Self-pay | Admitting: Cardiology

## 2012-04-22 ENCOUNTER — Ambulatory Visit (INDEPENDENT_AMBULATORY_CARE_PROVIDER_SITE_OTHER): Payer: Medicare Other | Admitting: Cardiology

## 2012-04-22 ENCOUNTER — Encounter: Payer: Self-pay | Admitting: Cardiology

## 2012-04-22 VITALS — BP 140/86 | HR 71 | Ht 66.5 in | Wt 172.0 lb

## 2012-04-22 NOTE — Progress Notes (Signed)
HPI:  The patient comes in for an add on.  She was seen by her primary MD and added on.  She has left arm numbness.  She denies exertional shortness of breath or chest tightness.  She did have minor fullness left upper chest and numbness in the arm.  Last night she had some nausea. We discussed in detail a bunch of options available to her.  She does not want a cath, but is agreeable to a nuc study.  Her baseine ECG is LBBB making interpretation difficult.  She has not had this in the past.     Current Outpatient Prescriptions  Medication Sig Dispense Refill  . aspirin 81 MG tablet Take 81 mg by mouth daily.        [name]        Marland Kitchen azelastine (ASTELIN) 137 MCG/SPRAY nasal spray Place 1 spray into the nose as needed.       . budesonide-formoterol (SYMBICORT) 160-4.5 MCG/ACT inhaler Inhale 2 puffs into the lungs 2 (two) times daily.        . Calcium Carbonate-Vitamin D (CALCIUM 600+D) 600-400 MG-UNIT per tablet Take 1 tablet by mouth 2 (two) times daily.      . cholecalciferol (VITAMIN D) 400 UNITS TABS Take 1,000 Units by mouth daily.        Marland Kitchen dextromethorphan-guaiFENesin (MUCINEX DM) 30-600 MG per 12 hr tablet Take 1 tablet by mouth daily as needed.       . loratadine (CLARITIN) 10 MG tablet Take 10 mg by mouth daily.      . Multiple Vitamins-Minerals (ONE-A-DAY 50 PLUS PO) Take 1 tablet by mouth daily.        Marland Kitchen NASAL SALINE NA Place into the nose as needed.       . Omega-3 Fatty Acids (FISH OIL) 1200 MG CAPS Take 1 capsule by mouth as needed.       Marland Kitchen PROAIR HFA 108 (90 BASE) MCG/ACT inhaler Inhale 1 puff into the lungs every 4 (four) hours as needed.        No current facility-administered medications for this visit.    Allergies  Allergen Reactions  . Codeine     Past Medical History  Diagnosis Date  . Hyperlipidemia   . Vitamin D deficiency   . Asthma   . Seasonal allergies   . OSA (obstructive sleep apnea) 02/16/2012     Past Surgical History  Procedure Laterality Date  . Partial hysterectomy      Family History  Problem Relation Age of Onset  . Cancer Mother   . Bladder Cancer Father   . Heart disease Father   . Allergies Mother     History   Social History  . Marital Status: Married    Spouse Name: N/A    Number of Children: N/A  . Years of Education: N/A   Occupational History  . retired    Social History Main Topics  . Smoking status: Never Smoker   . Smokeless tobacco: Not on file  . Alcohol Use: No  . Drug Use: No  . Sexually Active: Not on file   Other Topics Concern  . Not on file   Social History Narrative  . No narrative on file    ROS: Please see the HPI.  All other systems reviewed and negative.  PHYSICAL EXAM:  BP 140/86  Pulse 71  Ht 5' 6.5" (1.689 m)  Wt 172  lb (78.019 kg)  BMI 27.35 kg/m2  SpO2 97%  General: Well developed, well nourished, in no acute distress. Head:  Normocephalic and atraumatic. Neck: no JVD Lungs: Clear to auscultation and percussion. Heart: Normal S1 and paradoxical S2.  Minimal SEM.   Abdomen:  Normal bowel sounds; soft; non tender; no organomegaly Pulses: Pulses normal in all 4 extremities. Extremities: No clubbing or cyanosis. No edema. Neurologic: Alert and oriented x 3.  EKG:  NSR.  LBBB.    ASSESSMENT AND PLAN:

## 2012-04-22 NOTE — Assessment & Plan Note (Signed)
This is long standing.

## 2012-04-22 NOTE — Assessment & Plan Note (Signed)
Mild by echo 

## 2012-04-22 NOTE — Assessment & Plan Note (Signed)
The patient's symptoms are not clear-cut with regard to potential for underlying coronary disease. Nonetheless, I know her well and she is concerned about potential that this represents a coronary equivalent. As a result of this, we will pursue this. There is a question about her asthma, but we talked about the possibility of noninvasive testing versus invasive evaluation. Because of her left bundle noninvasive testing would require nuclear imaging. We will proceed with this, I will see her back on Monday and reassess. She checked problems during the weekend she is to come into the hospital in the interim.

## 2012-04-22 NOTE — Patient Instructions (Signed)
Your physician has requested that you have a lexiscan myoview. For further information please visit [email or web address] Please follow instruction sheet, as given.  Your physician recommends that you schedule a follow-up appointment in: 1 WEEK with Dr Riley Kill  Your physician recommends that you continue on your current medications as directed. Please refer to the Current Medication list given to you today.

## 2012-04-25 ENCOUNTER — Telehealth: Payer: Self-pay | Admitting: Pulmonary Disease

## 2012-04-25 ENCOUNTER — Ambulatory Visit (HOSPITAL_COMMUNITY): Payer: Medicare Other | Attending: Cardiovascular Disease | Admitting: Radiology

## 2012-04-25 VITALS — BP 127/75 | HR 53 | Ht 66.5 in | Wt 172.0 lb

## 2012-04-25 DIAGNOSIS — I447 Left bundle-branch block, unspecified: Secondary | ICD-10-CM | POA: Insufficient documentation

## 2012-04-25 DIAGNOSIS — I651 Occlusion and stenosis of basilar artery: Secondary | ICD-10-CM | POA: Insufficient documentation

## 2012-04-25 DIAGNOSIS — R002 Palpitations: Secondary | ICD-10-CM | POA: Insufficient documentation

## 2012-04-25 DIAGNOSIS — R209 Unspecified disturbances of skin sensation: Secondary | ICD-10-CM | POA: Insufficient documentation

## 2012-04-25 DIAGNOSIS — R079 Chest pain, unspecified: Secondary | ICD-10-CM

## 2012-04-25 DIAGNOSIS — R0789 Other chest pain: Secondary | ICD-10-CM | POA: Insufficient documentation

## 2012-04-25 DIAGNOSIS — R000 Tachycardia, unspecified: Secondary | ICD-10-CM | POA: Insufficient documentation

## 2012-04-25 DIAGNOSIS — Z8249 Family history of ischemic heart disease and other diseases of the circulatory system: Secondary | ICD-10-CM | POA: Insufficient documentation

## 2012-04-25 DIAGNOSIS — I10 Essential (primary) hypertension: Secondary | ICD-10-CM | POA: Insufficient documentation

## 2012-04-25 DIAGNOSIS — R5381 Other malaise: Secondary | ICD-10-CM | POA: Insufficient documentation

## 2012-04-25 MED ORDER — TECHNETIUM TC 99M SESTAMIBI GENERIC - CARDIOLITE
33.0000 | Freq: Once | INTRAVENOUS | Status: AC | PRN
  Administered 2012-04-25: 33 via INTRAVENOUS

## 2012-04-25 MED ORDER — TECHNETIUM TC 99M SESTAMIBI GENERIC - CARDIOLITE
11.0000 | Freq: Once | INTRAVENOUS | Status: AC | PRN
  Administered 2012-04-25: 11 via INTRAVENOUS

## 2012-04-25 MED ORDER — ADENOSINE (DIAGNOSTIC) 3 MG/ML IV SOLN
0.5600 mg/kg | Freq: Once | INTRAVENOUS | Status: AC
  Administered 2012-04-25: 43.8 mg via INTRAVENOUS

## 2012-04-25 NOTE — Telephone Encounter (Signed)
Auto CPAP 03/30/12 to 04/17/12 >> Used on 19 of 19 nights with average 5 hrs 12 min.  Average AHI 3.3 with median CPAP 8 cm H2O, and 95th percentile CPAP 10 cm H2O.  Will have my nurse inform pt that CPAP report shows good control of sleep apnea.  No change to current set up.  Will discuss in more detail at [facility] in March.

## 2012-04-25 NOTE — Progress Notes (Signed)
  [facility] SITE 3 NUCLEAR MED [address] [place], [place] [address] [phone]    Cardiology Nuclear Med Study  Marcia Norris is a 67 y.o. female     MRN : [id number]     DOB: Jan 18, 1946  Procedure Date: 04/25/2012  Nuclear Med Background Indication for Stress Test:  Evaluation for Ischemia History:  '10 ION:GEXBMW, EF=60%; '13 Echo:EF=65%, mild MR Cardiac Risk Factors: Carotid Disease, Family History - CAD, Hypertension, LBBB and Lipids  Symptoms:  Chest Pressure with and without Exertion and Tightness and Numbness in (L) Arm (last episode of chest discomfort was yesterday), Fatigue, Palpitations and Rapid HR   Nuclear Pre-Procedure Caffeine/Decaff Intake:  None NPO After: 2:30am   Lungs:  Clear. O2 Sat: 97% on room air. IV 0.9% NS with Angio Cath:  20g  IV Site: R Hand  IV Started by:  Cathlyn Parsons, RN  Chest Size (in):  40 Cup Size: C  Height: 5' 6.5" (1.689 m)  Weight:  172 lb (78.019 kg)  BMI:  Body mass index is 27.35 kg/(m^2). Tech Comments:  n/a    Nuclear Med Study 1 or 2 day study: 1 day  Stress Test Type:  Adenosine  Reading MD: [name], MD  Order Authorizing Provider:  Shawnie Pons, MD  Resting Radionuclide: Technetium 27m Sestamibi  Resting Radionuclide Dose: 11.0 mCi   Stress Radionuclide:  Technetium 34m Sestamibi  Stress Radionuclide Dose: 33.0 mCi           Stress Protocol Rest HR: 53 Stress HR: 81  Rest BP: 127/75 Stress BP: 133/52  Exercise Time (min): n/a METS: n/a   Predicted Max HR: 152 bpm % Max HR: 53.29 bpm Rate Pressure Product: 41324   Dose of Adenosine (mg):  43.8 Dose of Lexiscan: n/a mg  Dose of Atropine (mg): n/a Dose of Dobutamine: n/a mcg/kg/min (at max HR)  Stress Test Technologist: Smiley Houseman, CMA-N  Nuclear Technologist:  Domenic Polite, CNMT     Rest Procedure:  Myocardial perfusion imaging was performed at rest 45 minutes following the intravenous administration of Technetium 12m  Sestamibi.  Rest ECG: NSR-LBBB  Stress Procedure:  The patient received IV adenosine at 140 mcg/kg/min for 4 minutes.  She c/o chest tightness, 8/10, with infusion.  Technetium 51m Sestamibi was injected at the 2 minute mark and quantitative spect images were obtained after a 45 minute delay.  Stress ECG: No significant change from baseline ECG  QPS Raw Data Images:  Normal; no motion artifact; normal heart/lung ratio. Stress Images:  Normal homogeneous uptake in all areas of the myocardium. Rest Images:  Normal homogeneous uptake in all areas of the myocardium. Subtraction (SDS):  Normal Transient Ischemic Dilatation (Normal <1.22):  1.14 Lung/Heart Ratio (Normal <0.45):  0.38  Quantitative Gated Spect Images QGS EDV:  81 ml QGS ESV:  30 ml  Impression Exercise Capacity:  Adenosine study with no exercise. BP Response:  Normal blood pressure response. Clinical Symptoms:  Atypical chest pain. ECG Impression:  No significant ST segment change suggestive of ischemia. Comparison with Prior Nuclear Study: No images to compare  Overall Impression:  Normal stress nuclear study.Baseline ECG with LBBB  LV Ejection Fraction: 62%.  LV Wall Motion:  NL LV Function; NL Wall Motion  [name]

## 2012-04-26 NOTE — Telephone Encounter (Signed)
I spoke with patient about results and she verbalized understanding and had no questions 

## 2012-05-02 ENCOUNTER — Ambulatory Visit: Payer: Medicare Other | Admitting: Cardiology

## 2012-05-12 ENCOUNTER — Ambulatory Visit: Payer: Medicare Other | Admitting: Pulmonary Disease

## 2012-05-18 ENCOUNTER — Telehealth: Payer: Self-pay | Admitting: Pulmonary Disease

## 2012-05-18 NOTE — Telephone Encounter (Signed)
Called [facility], spoke with Jasmine December. Was advised they need OV notes from March 17 with Dr. Craige Cotta for a face to face documentation.   I do not show where pt was seen on March 17; she was seen on Jan 17 and had OV scheduled with VS on March 13.  Pt cancelled March 13 appt and has rescheduled to see Dr. Craige Cotta on March 31. Per Jasmine December, nothing further needed at this time.

## 2012-05-22 ENCOUNTER — Encounter (HOSPITAL_BASED_OUTPATIENT_CLINIC_OR_DEPARTMENT_OTHER): Payer: Self-pay | Admitting: *Deleted

## 2012-05-22 ENCOUNTER — Emergency Department (HOSPITAL_BASED_OUTPATIENT_CLINIC_OR_DEPARTMENT_OTHER)
Admission: EM | Admit: 2012-05-22 | Discharge: 2012-05-22 | Disposition: A | Discharge status: Home / Self Care | Payer: Medicare Other | Attending: Emergency Medicine | Admitting: Emergency Medicine

## 2012-05-22 DIAGNOSIS — Z8669 Personal history of other diseases of the nervous system and sense organs: Secondary | ICD-10-CM | POA: Insufficient documentation

## 2012-05-22 DIAGNOSIS — R300 Dysuria: Secondary | ICD-10-CM

## 2012-05-22 DIAGNOSIS — E785 Hyperlipidemia, unspecified: Secondary | ICD-10-CM | POA: Insufficient documentation

## 2012-05-22 DIAGNOSIS — Z90710 Acquired absence of both cervix and uterus: Secondary | ICD-10-CM | POA: Insufficient documentation

## 2012-05-22 DIAGNOSIS — Z8551 Personal history of malignant neoplasm of bladder: Secondary | ICD-10-CM | POA: Insufficient documentation

## 2012-05-22 DIAGNOSIS — IMO0002 Reserved for concepts with insufficient information to code with codable children: Secondary | ICD-10-CM | POA: Insufficient documentation

## 2012-05-22 DIAGNOSIS — E559 Vitamin D deficiency, unspecified: Secondary | ICD-10-CM | POA: Insufficient documentation

## 2012-05-22 DIAGNOSIS — Z79899 Other long term (current) drug therapy: Secondary | ICD-10-CM | POA: Insufficient documentation

## 2012-05-22 DIAGNOSIS — J45909 Unspecified asthma, uncomplicated: Secondary | ICD-10-CM | POA: Insufficient documentation

## 2012-05-22 DIAGNOSIS — Z9889 Other specified postprocedural states: Secondary | ICD-10-CM | POA: Insufficient documentation

## 2012-05-22 DIAGNOSIS — R350 Frequency of micturition: Secondary | ICD-10-CM

## 2012-05-22 DIAGNOSIS — Z7982 Long term (current) use of aspirin: Secondary | ICD-10-CM | POA: Insufficient documentation

## 2012-05-22 LAB — URINALYSIS, ROUTINE W REFLEX MICROSCOPIC
Bilirubin Urine: NEGATIVE
Ketones, ur: NEGATIVE mg/dL
Nitrite: NEGATIVE
Urobilinogen, UA: 1 mg/dL (ref 0.0–1.0)
pH: 6 (ref 5.0–8.0)

## 2012-05-22 LAB — URINE MICROSCOPIC-ADD ON

## 2012-05-22 MED ORDER — HYDROCODONE-ACETAMINOPHEN 5-325 MG PO TABS
ORAL_TABLET | ORAL | Status: AC
  Administered 2012-05-22: 1
  Filled 2012-05-22: qty 1

## 2012-05-22 MED ORDER — NITROFURANTOIN MONOHYD MACRO 100 MG PO CAPS: 100.0000 mg | ORAL_CAPSULE | Freq: Two times a day (BID) | ORAL | Status: DC

## 2012-05-22 MED ORDER — PHENAZOPYRIDINE HCL 100 MG PO TABS
95.0000 mg | ORAL_TABLET | Freq: Once | ORAL | Status: AC
  Administered 2012-05-22: 100 mg via ORAL
  Filled 2012-05-22: qty 1

## 2012-05-22 MED ORDER — PHENAZOPYRIDINE HCL 200 MG PO TABS: 200.0000 mg | ORAL_TABLET | Freq: Three times a day (TID) | ORAL | Status: DC | PRN

## 2012-05-22 NOTE — ED Notes (Signed)
Voided charges for sling and ortho visit per request by Mauricio Po so pt would not be charged in error for services not received.

## 2012-05-22 NOTE — ED Notes (Addendum)
Pt states she thinks she has a kidney infection. C/O frequency and dysuria, bladder pain. Had cystoscopy about a week ago and thinks that may have triggered this.

## 2012-05-22 NOTE — ED Provider Notes (Signed)
Medical screening examination/treatment/procedure(s) were performed by non-physician practitioner and as supervising physician I was immediately available for consultation/collaboration.   Nelia Shi, MD 05/22/12 2147

## 2012-05-22 NOTE — ED Notes (Addendum)
Pt reports low abd pain onset last severals days after having cystoscopy 1 week ago received 1 antibiotic pill  At that time  X 1 dose. States usually gets pyridum from urologist after scope but not on this occasion . Pt has hx of bladder CA that has been controlled x 10 years. And get annual scope at urologist. C/o low abd / pelvic area pain that radiates to L flank 8/10. Also reports pain and difficulty with urination. Urine specimen in lab

## 2012-05-22 NOTE — ED Provider Notes (Signed)
History     CSN: [id number]  Arrival date & time 05/22/12  1944   First MD Initiated Contact with Patient 05/22/12 2047      Chief Complaint  Patient presents with  . Dysuria    (Consider location/radiation/quality/duration/timing/severity/associated sxs/prior treatment) HPI Comments: Pt presents to the ED for increased urinary frequency and dysuria.  Pt had a cystoscopy approx 9 days ago and was not given pyridium after the procedure like she normally is.  States over the past few days symptoms have worsened.  Denies any fever or low back pain. Hx of bladder cancer approx 10 years ago.  Patient is a 67 y.o. female presenting with dysuria. The history is provided by the patient.  Dysuria     Past Medical History  Diagnosis Date  . Hyperlipidemia   . Vitamin D deficiency   . Asthma   . Seasonal allergies   . OSA (obstructive sleep apnea) 02/16/2012    Past Surgical History  Procedure Laterality Date  . Partial hysterectomy      Family History  Problem Relation Age of Onset  . Cancer Mother   . Bladder Cancer Father   . Heart disease Father   . Allergies Mother     History  Substance Use Topics  . Smoking status: Never Smoker   . Smokeless tobacco: Not on file  . Alcohol Use: No    OB History   Grav Para Term Preterm Abortions TAB SAB Ect Mult Living                  Review of Systems  Genitourinary: Positive for dysuria.  All other systems reviewed and are negative.    Allergies  Codeine  Home Medications   Current Outpatient Rx  Name  Route  Sig  Dispense  Refill  . aspirin 81 MG tablet   Oral   Take 81 mg by mouth daily.           Marland Kitchen atorvastatin (LIPITOR) 40 MG tablet   Oral   Take 40 mg by mouth daily.           Marland Kitchen azelastine (ASTELIN) 137 MCG/SPRAY nasal spray   Nasal   Place 1 spray into the nose as needed.          . budesonide-formoterol (SYMBICORT) 160-4.5 MCG/ACT inhaler   Inhalation   Inhale 2 puffs into the lungs 2  (two) times daily.           . Calcium Carbonate-Vitamin D (CALCIUM 600+D) 600-400 MG-UNIT per tablet   Oral   Take 1 tablet by mouth 2 (two) times daily.         . cholecalciferol (VITAMIN D) 400 UNITS TABS   Oral   Take 1,000 Units by mouth daily.           Marland Kitchen dextromethorphan-guaiFENesin (MUCINEX DM) 30-600 MG per 12 hr tablet   Oral   Take 1 tablet by mouth daily as needed.          . loratadine (CLARITIN) 10 MG tablet   Oral   Take 10 mg by mouth daily.         . Multiple Vitamins-Minerals (ONE-A-DAY 50 PLUS PO)   Oral   Take 1 tablet by mouth daily.           Marland Kitchen NASAL SALINE NA   Nasal   Place into the nose as needed.          . Omega-3 Fatty Acids (FISH  OIL) 1200 MG CAPS   Oral   Take 1 capsule by mouth as needed.          Marland Kitchen PROAIR HFA 108 (90 BASE) MCG/ACT inhaler   Inhalation   Inhale 1 puff into the lungs every 4 (four) hours as needed.            BP 144/66  Pulse 90  Temp(Src) 98.1 F (36.7 C) (Oral)  Resp 20  Ht 5' 6.5" (1.689 m)  Wt 170 lb (77.111 kg)  BMI 27.03 kg/m2  SpO2 96%  Physical Exam  Nursing note and vitals reviewed. Constitutional: She is oriented to person, place, and time. She appears well-developed and well-nourished.  HENT:  Head: Normocephalic and atraumatic.  Eyes: Conjunctivae and EOM are normal. Pupils are equal, round, and reactive to light.  Neck: Normal range of motion. Neck supple.  Cardiovascular: Normal rate, regular rhythm and normal heart sounds.   Pulmonary/Chest: Effort normal and breath sounds normal.  Abdominal: Soft. Bowel sounds are normal. She exhibits no distension. There is no tenderness. There is no CVA tenderness, no tenderness at McBurney's point and negative Murphy's sign.  Pressure sensation localized over bladder  Neurological: She is alert and oriented to person, place, and time.  Skin: Skin is warm and dry.  Psychiatric: She has a normal mood and affect.    ED Course  Procedures  (including critical care time)  Labs Reviewed  URINALYSIS, ROUTINE W REFLEX MICROSCOPIC - Abnormal; Notable for the following:    Hgb urine dipstick MODERATE (*)    All other components within normal limits  URINE CULTURE  URINE MICROSCOPIC-ADD ON   No results found.   1. Increased urinary frequency   2. Dysuria       MDM   U/A showing moderate amounts of blood.  Culture pending.  Given pts hx, will tx with short course abx and pyridium.  If sx do not improve in the next few days, she will follow up with her urologist.  Return precautions advised.        Garlon Hatchet, PA-C 05/22/12 2141

## 2012-05-22 NOTE — ED Notes (Signed)
I filed charge on this patient incorrectly for ortho task of applying an arm sling. I will email Nelly Rout with Epic to remove ortho charges from this patient's record.

## 2012-05-24 LAB — URINE CULTURE

## 2012-05-25 ENCOUNTER — Telehealth (HOSPITAL_COMMUNITY): Payer: Self-pay | Admitting: Emergency Medicine

## 2012-05-25 NOTE — ED Notes (Signed)
Results received from [facility]. (+) URNC  Rx given in ED for Nitrofuratoin -> No sensitivities listed  Chart to MD office for review.

## 2012-05-27 ENCOUNTER — Telehealth (HOSPITAL_COMMUNITY): Payer: Self-pay | Admitting: Emergency Medicine

## 2012-05-27 NOTE — ED Notes (Signed)
Arthor Captain PA reviewed chart. No treatment needed at this time.

## 2012-05-30 ENCOUNTER — Ambulatory Visit: Payer: Medicare Other | Admitting: Pulmonary Disease

## 2012-06-13 ENCOUNTER — Ambulatory Visit (INDEPENDENT_AMBULATORY_CARE_PROVIDER_SITE_OTHER): Payer: Medicare Other | Admitting: Cardiology

## 2012-06-13 ENCOUNTER — Encounter: Payer: Self-pay | Admitting: Cardiology

## 2012-06-13 VITALS — BP 132/80 | HR 89 | Ht 67.0 in | Wt 174.0 lb

## 2012-06-13 DIAGNOSIS — M79609 Pain in unspecified limb: Secondary | ICD-10-CM

## 2012-06-13 DIAGNOSIS — R9431 Abnormal electrocardiogram [ECG] [EKG]: Secondary | ICD-10-CM

## 2012-06-13 DIAGNOSIS — R0989 Other specified symptoms and signs involving the circulatory and respiratory systems: Secondary | ICD-10-CM

## 2012-06-13 DIAGNOSIS — G4733 Obstructive sleep apnea (adult) (pediatric): Secondary | ICD-10-CM

## 2012-06-13 DIAGNOSIS — E785 Hyperlipidemia, unspecified: Secondary | ICD-10-CM

## 2012-06-13 DIAGNOSIS — M79602 Pain in left arm: Secondary | ICD-10-CM

## 2012-06-13 NOTE — Patient Instructions (Addendum)
Your physician wants you to follow-up in:  August 2014 with Dr. Shirlee Latch. You will receive a reminder letter in the mail two months in advance. If you don't receive a letter, please call our office to schedule the follow-up appointment.

## 2012-06-18 NOTE — Assessment & Plan Note (Signed)
Neg dopplers 

## 2012-06-18 NOTE — Assessment & Plan Note (Signed)
Chronic LBBB with normal perfusion study and normal LV function, mild MR.  She will follow up in August to establish with Dr. Shirlee Latch.

## 2012-06-18 NOTE — Assessment & Plan Note (Signed)
Managed by primary care. 

## 2012-06-18 NOTE — Progress Notes (Signed)
HPI:  Overall she seems to be getting along ok at this point.  She feels some fluttering after she uses symbicort.   She had a sleep study which was abnormal.  She has had vigorous follow up with Dr. Craige Cotta regarding this, and initiated some CPAP.  I saw her back to evaluate recurrent left arm numbness.  She denies any ongoing chest pain at this point in time.  She does think the arm numbness is better.  Her nuclear images revealed the following findings:  Impression  Exercise Capacity: Adenosine study with no exercise.  BP Response: Normal blood pressure response.  Clinical Symptoms: Atypical chest pain.  ECG Impression: No significant ST segment change suggestive of ischemia.  Comparison with Prior Nuclear Study: No images to compare  Overall Impression: Normal stress nuclear study.Baseline ECG with LBBB  LV Ejection Fraction: 62%. LV Wall Motion: NL LV Function; NL Wall Motion  [name]    She has remained stable and we have talked in detail.  Her echo shows normal LV and mild MR.    Current Outpatient Prescriptions  Medication Sig Dispense Refill  . aspirin 81 MG tablet Take 81 mg by mouth daily.        [name]        Marland Kitchen azelastine (ASTELIN) 137 MCG/SPRAY nasal spray Place 1 spray into the nose as needed.       . budesonide-formoterol (SYMBICORT) 160-4.5 MCG/ACT inhaler Inhale 2 puffs into the lungs 2 (two) times daily.        . Calcium Carbonate-Vitamin D (CALCIUM 600+D) 600-400 MG-UNIT per tablet Take 1 tablet by mouth 2 (two) times daily.      . cholecalciferol (VITAMIN D) 400 UNITS TABS Take 1,000 Units by mouth daily.        Marland Kitchen dextromethorphan-guaiFENesin (MUCINEX DM) 30-600 MG per 12 hr tablet Take 1 tablet by mouth daily as needed.       . loratadine (CLARITIN) 10 MG tablet Take 10 mg by mouth daily.      . Multiple Vitamins-Minerals (ONE-A-DAY 50 PLUS PO) Take 1 tablet by mouth daily.        Marland Kitchen NASAL SALINE NA Place  into the nose as needed.       . Omega-3 Fatty Acids (FISH OIL) 1200 MG CAPS Take 1 capsule by mouth as needed.       Marland Kitchen PROAIR HFA 108 (90 BASE) MCG/ACT inhaler Inhale 1 puff into the lungs every 4 (four) hours as needed.        No current facility-administered medications for this visit.    Allergies  Allergen Reactions  . Codeine     Past Medical History  Diagnosis Date  . Hyperlipidemia   . Vitamin D deficiency   . Asthma   . Seasonal allergies   . OSA (obstructive sleep apnea) 02/16/2012    Past Surgical History  Procedure Laterality Date  . Partial hysterectomy      Family History  Problem Relation Age of Onset  . Cancer Mother   . Bladder Cancer Father   . Heart disease Father   . Allergies Mother     History   Social History  . Marital Status: Married    Spouse Name: N/A    Number of Children: N/A  . Years of Education: N/A   Occupational History  . retired    Social History Main Topics  . Smoking status: Never Smoker   .  Smokeless tobacco: Not on file  . Alcohol Use: No  . Drug Use: No  . Sexually Active: Not on file   Other Topics Concern  . Not on file   Social History Narrative  . No narrative on file    ROS: Please see the HPI.  All other systems reviewed and negative.  PHYSICAL EXAM:  BP 132/80  Pulse 89  Ht 5\' 7"  (1.702 m)  Wt 174 lb (78.926 kg)  BMI 27.25 kg/m2  SpO2 95%  General: Well developed, well nourished, in no acute distress. Head:  Normocephalic and atraumatic. Neck: no JVD Lungs: Clear to auscultation and percussion. Heart: Normal S1 and S2.  Minimal SEM.  Paradoxical S2.   Abdomen:  Normal bowel sounds; soft; non tender; no organomegaly Pulses: Pulses normal in all 4 extremities. Extremities: No clubbing or cyanosis. No edema. Neurologic: Alert and oriented x 3.  EKG:  Prior tracings with LBBB.    ASSESSMENT AND PLAN:

## 2012-06-18 NOTE — Assessment & Plan Note (Signed)
Resolved mostly.  Perfusion study normal.

## 2012-06-18 NOTE — Assessment & Plan Note (Signed)
Under evaluation by Dr. Craige Cotta.

## 2012-06-21 ENCOUNTER — Telehealth: Payer: Self-pay | Admitting: Pulmonary Disease

## 2012-06-21 NOTE — Telephone Encounter (Signed)
Auto CPAP 05/15/12 to 06/13/12 >> Used on 29 of 30 nights with average 6 hrs 17 min.  Average AHI 0.8 with median CPAP 7 cm H2O and 95th percentile CPAP 10 cm H2O.  Pt is scheduled for ROV 06/22/12.  Will discuss results then.

## 2012-06-22 ENCOUNTER — Encounter: Payer: Self-pay | Admitting: Pulmonary Disease

## 2012-06-22 ENCOUNTER — Ambulatory Visit (INDEPENDENT_AMBULATORY_CARE_PROVIDER_SITE_OTHER): Payer: Medicare Other | Admitting: Pulmonary Disease

## 2012-06-22 VITALS — BP 122/74 | HR 89 | Temp 97.9°F | Ht 67.0 in | Wt 173.8 lb

## 2012-06-22 DIAGNOSIS — G4733 Obstructive sleep apnea (adult) (pediatric): Secondary | ICD-10-CM

## 2012-06-22 NOTE — Assessment & Plan Note (Signed)
She is compliant with CPAP and reports benefit from therapy.  Will continue with auto CPAP.

## 2012-06-22 NOTE — Progress Notes (Signed)
Chief Complaint  Patient presents with  . Follow-up    Pt states she wears her CPAP everynight x 6 hrs at least a night. sometimes 8 hrs a night. denies any problems w/ mask/machine currently. Pt reports she does feel rested during the day.     History of Present Illness: Marcia Norris is a 67 y.o. female with OSA.  She is doing well with CPAP.  She has a full face mask, and this fits well.  She is getting about 7 hours sleep, and sleeping through the night.  She is no longer snoring, and her husband has been able to come back to their bedroom to sleep at night.  She is feeling more rested during the day also.  She is surprised about how well she is doing with CPAP.  TESTS: Echo 04/03/11 >> EF 60 to 65%, grade 1 diastolic dysfx, mild MR PSG 03/08/12 >> AHI 10.6, SpO2 low 82%, PLMI 1.8. Auto CPAP 05/15/12 to 06/13/12 >> Used on 29 of 30 nights with average 6 hrs 17 min. Average AHI 0.8 with median CPAP 7 cm H2O and 95th percentile CPAP 10 cm H2O.  She  has a past medical history of Hyperlipidemia; Vitamin D deficiency; Asthma; Seasonal allergies; and OSA (obstructive sleep apnea) (02/16/2012).  She  has past surgical history that includes Partial hysterectomy.  Outpatient Encounter Prescriptions as of 06/22/2012  Medication Sig Dispense Refill  . aspirin 81 MG tablet Take 81 mg by mouth daily.        [name]        Marland Kitchen azelastine (ASTELIN) 137 MCG/SPRAY nasal spray Place 1 spray into the nose as needed.       . budesonide-formoterol (SYMBICORT) 160-4.5 MCG/ACT inhaler Inhale 2 puffs into the lungs 2 (two) times daily.        . Calcium Carbonate-Vitamin D (CALCIUM 600+D) 600-400 MG-UNIT per tablet Take 1 tablet by mouth 2 (two) times daily.      . cholecalciferol (VITAMIN D) 400 UNITS TABS Take 1,000 Units by mouth daily.        Marland Kitchen dextromethorphan-guaiFENesin (MUCINEX DM) 30-600 MG per 12 hr tablet Take 1 tablet by mouth daily as needed.        . loratadine (CLARITIN) 10 MG tablet Take 10 mg by mouth daily.      . Multiple Vitamins-Minerals (ONE-A-DAY 50 PLUS PO) Take 1 tablet by mouth daily.        Marland Kitchen NASAL SALINE NA Place into the nose as needed.       . Omega-3 Fatty Acids (FISH OIL) 1200 MG CAPS Take 1 capsule by mouth as needed.       Marland Kitchen PROAIR HFA 108 (90 BASE) MCG/ACT inhaler Inhale 1 puff into the lungs every 4 (four) hours as needed.        No facility-administered encounter medications on file as of 06/22/2012.    Allergies  Allergen Reactions  . Codeine     Physical Exam:  General - No distress ENT - No sinus tenderness, MP 4, enlarged tongue, low soft palate, no oral exudate, no LAN Cardiac - s1s2 regular, no murmur Chest - No wheeze/rales/dullness Back - No focal tenderness Abd - Soft, non-tender Ext - No edema Neuro - Normal strength Skin - No rashes Psych - normal mood, and behavior   Assessment/Plan:  Coralyn Helling, MD [facility] Pager:  [phone] 06/22/2012, 3:47 PM

## 2012-06-22 NOTE — Patient Instructions (Signed)
Follow up in 1 year.

## 2012-06-24 ENCOUNTER — Ambulatory Visit: Payer: Medicare Other | Admitting: Pulmonary Disease

## 2012-07-11 ENCOUNTER — Telehealth: Payer: Self-pay | Admitting: Pulmonary Disease

## 2012-07-11 NOTE — Telephone Encounter (Signed)
Dr Craige Cotta, is this okay with you to send her ov note, (some docs do not like this, so need to be sure) thanks!

## 2012-07-12 NOTE — Telephone Encounter (Signed)
OV has been printed out and faxed to [phone] Spoke with Jasmine December at [facility] to make her aware of this Nothing further at this time

## 2012-07-12 NOTE — Telephone Encounter (Signed)
Okay to send OV note.

## 2012-07-26 ENCOUNTER — Encounter: Payer: Self-pay | Admitting: Cardiology

## 2012-10-04 ENCOUNTER — Ambulatory Visit: Payer: Medicare Other | Admitting: Cardiology

## 2012-10-06 DIAGNOSIS — R208 Other disturbances of skin sensation: Secondary | ICD-10-CM | POA: Insufficient documentation

## 2012-10-11 ENCOUNTER — Ambulatory Visit (HOSPITAL_COMMUNITY): Payer: Medicare Other | Attending: Cardiology

## 2012-10-11 DIAGNOSIS — E785 Hyperlipidemia, unspecified: Secondary | ICD-10-CM | POA: Insufficient documentation

## 2012-10-11 DIAGNOSIS — I340 Nonrheumatic mitral (valve) insufficiency: Secondary | ICD-10-CM

## 2012-10-11 DIAGNOSIS — G4733 Obstructive sleep apnea (adult) (pediatric): Secondary | ICD-10-CM

## 2012-10-11 DIAGNOSIS — R0989 Other specified symptoms and signs involving the circulatory and respiratory systems: Secondary | ICD-10-CM

## 2012-10-11 DIAGNOSIS — I059 Rheumatic mitral valve disease, unspecified: Secondary | ICD-10-CM | POA: Insufficient documentation

## 2012-10-11 DIAGNOSIS — I10 Essential (primary) hypertension: Secondary | ICD-10-CM | POA: Insufficient documentation

## 2012-10-11 NOTE — Progress Notes (Signed)
Echocardiogram performed.  

## 2012-10-20 ENCOUNTER — Encounter: Payer: Self-pay | Admitting: Cardiology

## 2012-10-20 ENCOUNTER — Ambulatory Visit (INDEPENDENT_AMBULATORY_CARE_PROVIDER_SITE_OTHER): Payer: Medicare Other | Admitting: Cardiology

## 2012-10-20 VITALS — BP 120/78 | HR 72 | Ht 67.0 in | Wt 176.0 lb

## 2012-10-20 DIAGNOSIS — I6529 Occlusion and stenosis of unspecified carotid artery: Secondary | ICD-10-CM

## 2012-10-20 DIAGNOSIS — I658 Occlusion and stenosis of other precerebral arteries: Secondary | ICD-10-CM

## 2012-10-20 DIAGNOSIS — E785 Hyperlipidemia, unspecified: Secondary | ICD-10-CM

## 2012-10-20 DIAGNOSIS — I6523 Occlusion and stenosis of bilateral carotid arteries: Secondary | ICD-10-CM

## 2012-10-20 DIAGNOSIS — I447 Left bundle-branch block, unspecified: Secondary | ICD-10-CM

## 2012-10-20 DIAGNOSIS — R0989 Other specified symptoms and signs involving the circulatory and respiratory systems: Secondary | ICD-10-CM

## 2012-10-20 NOTE — Patient Instructions (Addendum)
Your physician has requested that you have a carotid duplex. This test is an ultrasound of the carotid arteries in your neck. It looks at blood flow through these arteries that supply the brain with blood. Allow one hour for this exam. There are no restrictions or special instructions. December 2014  Your physician wants you to follow-up in: 1 year with Dr Shirlee Latch. (August 2015). You will receive a reminder letter in the mail two months in advance. If you don't receive a letter, please call our office to schedule the follow-up appointment.

## 2012-10-22 DIAGNOSIS — I447 Left bundle-branch block, unspecified: Secondary | ICD-10-CM | POA: Insufficient documentation

## 2012-10-22 NOTE — Progress Notes (Signed)
Patient ID: Marcia Norris, female   DOB: 18-Apr-1945, 67 y.o.   MRN: [id number] PCP: Dr. Cyndia Bent  67 yo with history of OSA, mild carotid stenosis, and chronic LBBB presents for cardiology followup.  She has been seen by Dr. Riley Kill in the past and is seen by me for the first time today.  Most recent functional study was an adenosine myoview in 2/14.  This was a normal study. She has been doing well recently.  She is going to the [facility].  No exertional dyspnea or chest pain.  No stroke-like symptoms.  No palpitations or syncope.   PMH: 1. OSA: On CPAP 2. Hyperlipidemia 3. Asthma 4. Chronic LBBB: adenosine Myoview in 2/14 showed EF 62%, no ischemia or infarction.  Echo (8/14) with EF 55-60%, mild MR.   5. Carotid stenosis: Most recent carotid dopplers showed 0-39% RICA stenosis in 12/12.   SH: Married, nonsmoker, lives in [place], retired, 2 kids.   FH: Father with CVA, CHF, ESRD.  ROS: All systems reviewed and negative except as per HPI.    Current Outpatient Prescriptions  Medication Sig Dispense Refill  . aspirin 81 MG tablet Take 81 mg by mouth daily.        [name]        Marland Kitchen azelastine (ASTELIN) 137 MCG/SPRAY nasal spray Place 1 spray into the nose as needed.       . budesonide-formoterol (SYMBICORT) 160-4.5 MCG/ACT inhaler Inhale 2 puffs into the lungs 2 (two) times daily.        . Calcium Carbonate-Vitamin D (CALCIUM 600+D) 600-400 MG-UNIT per tablet Take 1 tablet by mouth 2 (two) times daily.      . cholecalciferol (VITAMIN D) 400 UNITS TABS Take 1,000 Units by mouth daily.        Marland Kitchen dextromethorphan-guaiFENesin (MUCINEX DM) 30-600 MG per 12 hr tablet Take 1 tablet by mouth daily as needed.       . loratadine (CLARITIN) 10 MG tablet Take 10 mg by mouth as needed.       . Multiple Vitamins-Minerals (ONE-A-DAY 50 PLUS PO) Take 1 tablet by mouth daily.        Marland Kitchen NASAL SALINE NA Place into the nose as needed.       . Omega-3 Fatty  Acids (FISH OIL) 1200 MG CAPS Take 1 capsule by mouth as needed.       Marland Kitchen PROAIR HFA 108 (90 BASE) MCG/ACT inhaler Inhale 1 puff into the lungs every 4 (four) hours as needed.        No current facility-administered medications for this visit.    BP 120/78  Pulse 72  Ht 5\' 7"  (1.702 m)  Wt 79.833 kg (176 lb)  BMI 27.56 kg/m2  SpO2 97% General: NAD Neck: No JVD, no thyromegaly or thyroid nodule.  Lungs: Clear to auscultation bilaterally with normal respiratory effort. CV: Nondisplaced PMI.  Heart regular S1/S2, no S3/S4, no murmur.  Trace ankle edema.  Right carotid bruit.  Normal pedal pulses.  Abdomen: Soft, nontender, no hepatosplenomegaly, no distention.  Skin: Intact without lesions or rashes.  Neurologic: Alert and oriented x 3.  Psych: Normal affect. Extremities: No clubbing or cyanosis.   Assessment/Plan: 1. LBBB: Chronic.  No evidence for significant CAD by recent adenosine myoview or by symptoms. EF preserved on echo in 8/14.  2. Carotid bruit: I will arrange for carotids in 12/14 (2 years after prior).  Mild carotid stenosis in  12/12.   3. Hyperlipidemia: Will call PCP for copy of most recent lipids.   Marca Ancona 10/22/2012

## 2012-10-28 ENCOUNTER — Ambulatory Visit: Payer: Self-pay | Admitting: Neurology

## 2012-11-03 ENCOUNTER — Ambulatory Visit: Payer: Self-pay | Admitting: Neurology

## 2012-11-23 ENCOUNTER — Encounter: Payer: Self-pay | Admitting: Cardiology

## 2012-11-30 DIAGNOSIS — Z8679 Personal history of other diseases of the circulatory system: Secondary | ICD-10-CM

## 2012-11-30 HISTORY — DX: Personal history of other diseases of the circulatory system: Z86.79

## 2012-12-10 HISTORY — PX: CATARACT EXTRACTION W/ INTRAOCULAR LENS  IMPLANT, BILATERAL: SHX1307

## 2012-12-21 ENCOUNTER — Encounter (HOSPITAL_BASED_OUTPATIENT_CLINIC_OR_DEPARTMENT_OTHER): Payer: Self-pay | Admitting: Emergency Medicine

## 2012-12-21 ENCOUNTER — Telehealth: Payer: Self-pay | Admitting: Physician Assistant

## 2012-12-21 ENCOUNTER — Emergency Department (HOSPITAL_BASED_OUTPATIENT_CLINIC_OR_DEPARTMENT_OTHER): Payer: 59

## 2012-12-21 ENCOUNTER — Inpatient Hospital Stay (HOSPITAL_BASED_OUTPATIENT_CLINIC_OR_DEPARTMENT_OTHER)
Admission: EM | Admit: 2012-12-21 | Discharge: 2012-12-22 | DRG: 310 | Disposition: A | Discharge status: Home / Self Care | Payer: 59 | Attending: Cardiovascular Disease | Admitting: Cardiovascular Disease

## 2012-12-21 DIAGNOSIS — I10 Essential (primary) hypertension: Secondary | ICD-10-CM | POA: Diagnosis present

## 2012-12-21 DIAGNOSIS — Z8052 Family history of malignant neoplasm of bladder: Secondary | ICD-10-CM

## 2012-12-21 DIAGNOSIS — E785 Hyperlipidemia, unspecified: Secondary | ICD-10-CM | POA: Diagnosis present

## 2012-12-21 DIAGNOSIS — G4733 Obstructive sleep apnea (adult) (pediatric): Secondary | ICD-10-CM | POA: Diagnosis present

## 2012-12-21 DIAGNOSIS — I059 Rheumatic mitral valve disease, unspecified: Secondary | ICD-10-CM | POA: Diagnosis present

## 2012-12-21 DIAGNOSIS — I200 Unstable angina: Secondary | ICD-10-CM

## 2012-12-21 DIAGNOSIS — Z79899 Other long term (current) drug therapy: Secondary | ICD-10-CM

## 2012-12-21 DIAGNOSIS — I119 Hypertensive heart disease without heart failure: Secondary | ICD-10-CM | POA: Diagnosis present

## 2012-12-21 DIAGNOSIS — I447 Left bundle-branch block, unspecified: Secondary | ICD-10-CM | POA: Diagnosis present

## 2012-12-21 DIAGNOSIS — Z7982 Long term (current) use of aspirin: Secondary | ICD-10-CM

## 2012-12-21 DIAGNOSIS — I4892 Unspecified atrial flutter: Principal | ICD-10-CM | POA: Diagnosis present

## 2012-12-21 DIAGNOSIS — IMO0002 Reserved for concepts with insufficient information to code with codable children: Secondary | ICD-10-CM

## 2012-12-21 DIAGNOSIS — Z8551 Personal history of malignant neoplasm of bladder: Secondary | ICD-10-CM

## 2012-12-21 DIAGNOSIS — I519 Heart disease, unspecified: Secondary | ICD-10-CM | POA: Diagnosis present

## 2012-12-21 DIAGNOSIS — J45909 Unspecified asthma, uncomplicated: Secondary | ICD-10-CM | POA: Diagnosis present

## 2012-12-21 DIAGNOSIS — R7309 Other abnormal glucose: Secondary | ICD-10-CM | POA: Diagnosis present

## 2012-12-21 DIAGNOSIS — Z8249 Family history of ischemic heart disease and other diseases of the circulatory system: Secondary | ICD-10-CM

## 2012-12-21 LAB — BASIC METABOLIC PANEL
BUN: 17 mg/dL (ref 6–23)
Chloride: 102 mEq/L (ref 96–112)
Creatinine, Ser: 0.9 mg/dL (ref 0.50–1.10)
GFR calc Af Amer: 74 mL/min — ABNORMAL LOW (ref >90)

## 2012-12-21 LAB — CBC
HCT: 43.2 % (ref 36.0–46.0)
MCH: 31.8 pg (ref 26.0–34.0)
MCHC: 34 g/dL (ref 30.0–36.0)
MCV: 93.5 fL (ref 78.0–100.0)
RDW: 13 % (ref 11.5–15.5)
WBC: 14.5 10*3/uL — ABNORMAL HIGH (ref 4.0–10.5)

## 2012-12-21 MED ORDER — ATORVASTATIN CALCIUM 40 MG PO TABS
40.0000 mg | ORAL_TABLET | Freq: Every day | ORAL | Status: DC
  Administered 2012-12-22: 40 mg via ORAL
  Filled 2012-12-21: qty 1

## 2012-12-21 MED ORDER — INSULIN ASPART 100 UNIT/ML ~~LOC~~ SOLN
0.0000 [IU] | Freq: Every day | SUBCUTANEOUS | Status: DC
  Administered 2012-12-21: 4 [IU] via SUBCUTANEOUS

## 2012-12-21 MED ORDER — PREDNISONE 50 MG PO TABS
60.0000 mg | ORAL_TABLET | Freq: Every day | ORAL | Status: AC
  Administered 2012-12-22: 60 mg via ORAL
  Filled 2012-12-21: qty 1

## 2012-12-21 MED ORDER — HEPARIN SODIUM (PORCINE) 5000 UNIT/ML IJ SOLN
5000.0000 [IU] | Freq: Three times a day (TID) | INTRAMUSCULAR | Status: DC
  Administered 2012-12-21 – 2012-12-22 (×2): 5000 [IU] via SUBCUTANEOUS
  Filled 2012-12-21 (×5): qty 1

## 2012-12-21 MED ORDER — NITROGLYCERIN 2 % TD OINT
1.0000 [in_us] | TOPICAL_OINTMENT | Freq: Once | TRANSDERMAL | Status: AC
  Administered 2012-12-21: 1 [in_us] via TOPICAL
  Filled 2012-12-21: qty 1

## 2012-12-21 MED ORDER — NITROGLYCERIN 0.4 MG SL SUBL
0.4000 mg | SUBLINGUAL_TABLET | SUBLINGUAL | Status: DC | PRN
  Administered 2012-12-21: 0.4 mg via SUBLINGUAL
  Filled 2012-12-21 (×2): qty 25

## 2012-12-21 MED ORDER — LORATADINE 10 MG PO TABS
10.0000 mg | ORAL_TABLET | Freq: Every day | ORAL | Status: DC | PRN
  Filled 2012-12-21: qty 1

## 2012-12-21 MED ORDER — ASPIRIN 325 MG PO TABS
325.0000 mg | ORAL_TABLET | Freq: Once | ORAL | Status: AC
  Administered 2012-12-21: 325 mg via ORAL
  Filled 2012-12-21: qty 1

## 2012-12-21 MED ORDER — METOPROLOL TARTRATE 1 MG/ML IV SOLN
5.0000 mg | Freq: Once | INTRAVENOUS | Status: AC
  Administered 2012-12-21: 5 mg via INTRAVENOUS
  Filled 2012-12-21: qty 5

## 2012-12-21 MED ORDER — AZELASTINE HCL 0.1 % NA SOLN
1.0000 | Freq: Every day | NASAL | Status: DC | PRN
  Filled 2012-12-21: qty 30

## 2012-12-21 MED ORDER — BUDESONIDE-FORMOTEROL FUMARATE 160-4.5 MCG/ACT IN AERO
2.0000 | INHALATION_SPRAY | Freq: Two times a day (BID) | RESPIRATORY_TRACT | Status: DC
  Administered 2012-12-22: 2 via RESPIRATORY_TRACT
  Filled 2012-12-21: qty 6

## 2012-12-21 MED ORDER — PANTOPRAZOLE SODIUM 40 MG PO TBEC
40.0000 mg | DELAYED_RELEASE_TABLET | Freq: Every day | ORAL | Status: DC
  Administered 2012-12-22: 40 mg via ORAL
  Filled 2012-12-21: qty 1

## 2012-12-21 MED ORDER — ALPRAZOLAM 0.5 MG PO TABS: 0.5000 mg | ORAL_TABLET | Freq: Every evening | ORAL | Status: DC | PRN

## 2012-12-21 MED ORDER — AMIODARONE HCL IN DEXTROSE 360-4.14 MG/200ML-% IV SOLN
60.0000 mg/h | INTRAVENOUS | Status: AC
  Administered 2012-12-21 – 2012-12-22 (×2): 60 mg/h via INTRAVENOUS
  Filled 2012-12-21 (×2): qty 200

## 2012-12-21 MED ORDER — AMIODARONE HCL IN DEXTROSE 360-4.14 MG/200ML-% IV SOLN
30.0000 mg/h | INTRAVENOUS | Status: DC
  Filled 2012-12-21: qty 200

## 2012-12-21 MED ORDER — VITAMIN D3 25 MCG (1000 UNIT) PO TABS
1000.0000 [IU] | ORAL_TABLET | Freq: Every day | ORAL | Status: DC
  Administered 2012-12-22: 1000 [IU] via ORAL
  Filled 2012-12-21: qty 1

## 2012-12-21 MED ORDER — AMIODARONE IV BOLUS ONLY 150 MG/100ML
150.0000 mg | Freq: Once | INTRAVENOUS | Status: AC
  Administered 2012-12-21: 150 mg via INTRAVENOUS
  Filled 2012-12-21: qty 100

## 2012-12-21 MED ORDER — SODIUM CHLORIDE 0.9 % IJ SOLN
3.0000 mL | Freq: Two times a day (BID) | INTRAMUSCULAR | Status: DC
  Administered 2012-12-21 – 2012-12-22 (×2): 3 mL via INTRAVENOUS

## 2012-12-21 MED ORDER — INSULIN ASPART 100 UNIT/ML ~~LOC~~ SOLN
0.0000 [IU] | Freq: Three times a day (TID) | SUBCUTANEOUS | Status: DC
  Administered 2012-12-22: 1 [IU] via SUBCUTANEOUS

## 2012-12-21 MED ORDER — CALCIUM CARBONATE-VITAMIN D 500-200 MG-UNIT PO TABS
1.0000 | ORAL_TABLET | Freq: Two times a day (BID) | ORAL | Status: DC
  Administered 2012-12-22: 1 via ORAL
  Filled 2012-12-21 (×3): qty 1

## 2012-12-21 NOTE — ED Notes (Signed)
Pt placed on 2lt Deport. Sats 92%

## 2012-12-21 NOTE — Telephone Encounter (Addendum)
Patient called the answering svc regarding chest pain located in the left-side of her chest radiating to her throat and back with associated shortness of breath. She has a chronic LBBB. She had an adenosine Myoview in 04/2012 indicating no ischemia, 62%. Echo 09/2012 revealed preserved EF and no WMAs. She recently had an asthma flare and was placed on prednisone. She initially suspected this represented an asthma attack, but admits this was different in quality and more severe. She is currently being driven to [facility] ED by her husband. Further work-up to be done at that time.    Jacqulyn Bath, PA-C 12/21/2012 7:53 PM

## 2012-12-21 NOTE — ED Notes (Signed)
CP that radiates to left arm x 1 hour with SOB-pt states she has been having issues with asthma and was placed on prednisone by PCP yesterday

## 2012-12-21 NOTE — H&P (Signed)
Cardiology History and Physical  Marcia C, MD  History of Present Illness (and review of medical records): Marcia Norris is a 67 y.o. female who presents for evaluation of chest pressure.  Patient developed symptoms after eating dinner.  Midsternal chest pressure along with feeling faint.  She has had chronic cough for 5 wks treated with antibiotics and now just started prednisone taper one day prior.  She used her albuterol inh 2x today which she normally doesn't use.  She presented to [facility] and was found to be in a WCT.  She was placed on Amiodarone and transferred for further evaluation.  She is now comfortable in the ICU.  She has no chest pain or shortness of breath.  Previous Studies: [id number] Study Conclusions - Left ventricle: The cavity size was normal. Wall thickness was increased in a pattern of mild LVH. Systolic function was normal. The estimated ejection fraction was in the range of 55% to 65%. Wall motion was normal; there were no regional wall motion abnormalities. Doppler parameters are consistent with abnormal left ventricular relaxation (grade 1 diastolic dysfunction). - Mitral valve: Mild regurgitation. - Atrial septum: No defect or patent foramen ovale was identified.   Stress test 04/2012: Impression  Exercise Capacity: Adenosine study with no exercise.  BP Response: Normal blood pressure response.  Clinical Symptoms: Atypical chest pain.  ECG Impression: No significant ST segment change suggestive of ischemia.  Comparison with Prior Nuclear Study: No images to compare  Overall Impression: Normal stress nuclear study.Baseline ECG with LBBB  LV Ejection Fraction: 62%. LV Wall Motion: NL LV Function; NL Wall Motion  [name]    Review of Systems She denies any fevers, chills. Further review of systems was otherwise negative other than stated in HPI.  Patient Active Problem List   Diagnosis Date Noted  . LBBB (left bundle branch block)  10/22/2012  . Arm pain 04/22/2012  . Mitral regurgitation 04/07/2012  . OSA (obstructive sleep apnea) 02/16/2012  . Murmur 03/30/2011  . Tremor 03/30/2011  . CAROTID BRUIT 12/19/2009  . ABNORMAL EKG 12/19/2009  . CHEST PAIN-UNSPECIFIED 01/17/2009  . PALPITATIONS, RECURRENT 08/01/2008  . SHORTNESS OF BREATH 08/01/2008  . BLADDER CANCER 05/28/2008  . HYPERTENSION, BENIGN 05/28/2008  . HYPERLIPIDEMIA 01/04/2007  . ALLERGIC RHINITIS 01/04/2007  . ASTHMA 01/04/2007  . COUGH, CHRONIC 01/04/2007  . HYSTERECTOMY, HX OF 01/04/2007   Past Medical History  Diagnosis Date  . Hyperlipidemia   . Vitamin D deficiency   . Asthma   . Seasonal allergies   . OSA (obstructive sleep apnea) 02/16/2012    Past Surgical History  Procedure Laterality Date  . Partial hysterectomy      Prescriptions prior to admission  Medication Sig Dispense Refill  . albuterol (PROVENTIL HFA;VENTOLIN HFA) 108 (90 BASE) MCG/ACT inhaler Inhale 2 puffs into the lungs every 6 (six) hours as needed for wheezing.      Marland Kitchen ALPRAZolam (XANAX) 0.5 MG tablet Take 0.25-0.5 mg by mouth at bedtime as needed for sleep.      Marland Kitchen aspirin 81 MG tablet Take 81 mg by mouth daily.       [name]        Marland Kitchen azelastine (ASTELIN) 137 MCG/SPRAY nasal spray Place 1 spray into the nose daily as needed for rhinitis.       . budesonide-formoterol (SYMBICORT) 160-4.5 MCG/ACT inhaler Inhale 2 puffs into the lungs 2 (two) times daily.        Marland Kitchen  Calcium Carbonate-Vitamin D (CALCIUM 600+D) 600-400 MG-UNIT per tablet Take 1 tablet by mouth 2 (two) times daily.      . cholecalciferol (VITAMIN D) 1000 UNITS tablet Take 1,000 Units by mouth daily.      Marland Kitchen loratadine (CLARITIN) 10 MG tablet Take 10 mg by mouth daily as needed for allergies.       . Multiple Vitamins-Minerals (ONE-A-DAY 50 PLUS PO) Take 1 tablet by mouth daily.        Marland Kitchen omeprazole (PRILOSEC) 20 MG capsule Take 20 mg by mouth daily.      .  predniSONE (DELTASONE) 20 MG tablet Take 20-60 mg by mouth daily. Take 60mg  for 3 days, then 40mg  for 3 days, then 20mg  for 3 days       Allergies  Allergen Reactions  . Codeine Nausea Only    History  Substance Use Topics  . Smoking status: Never Smoker   . Smokeless tobacco: Not on file  . Alcohol Use: No    Family History  Problem Relation Age of Onset  . Cancer Mother   . Bladder Cancer Father   . Heart disease Father   . Allergies Mother      Objective:  Patient Vitals for the past 8 hrs:  BP Temp Temp src Pulse Resp SpO2 Height Weight  12/21/12 2200 133/46 mmHg - - 66 16 99 % - -  12/21/12 2155 127/40 mmHg - - 69 19 99 % - -  12/21/12 2138 - - - 79 - 99 % - -  12/21/12 2015 116/56 mmHg - - - 18 97 % - -  12/21/12 1946 126/51 mmHg - - - 18 98 % - -  12/21/12 1936 120/65 mmHg - - - 18 98 % - -  12/21/12 1910 127/67 mmHg 98.5 F (36.9 Norris) Oral 98 18 98 % 5\' 7"  (1.702 m) 76.204 kg (168 lb)   General appearance: alert, cooperative, appears stated age and no distress Head: Normocephalic, without obvious abnormality, atraumatic Eyes: conjunctivae/corneas clear. PERRL, EOM's intact. Fundi benign. Neck: no carotid bruit, no JVD and supple, symmetrical, trachea midline Lungs: clear to auscultation bilaterally, no wheezing Chest wall: no tenderness Heart: regular rate and rhythm, S1, S2 normal, no murmur, click, rub or gallop Abdomen: soft, non-tender; bowel sounds normal; no masses,  no organomegaly Extremities: extremities normal, atraumatic, no cyanosis or edema Pulses: 2+ and symmetric Neurologic: Grossly normal  Results for orders placed during the hospital encounter of 12/21/12 (from the past 48 hour(s))  CBC     Status: Abnormal   Collection Time    12/21/12  7:25 PM      Result Value Range   WBC 14.5 (*) 4.0 - 10.5 K/uL   RBC 4.62  3.87 - 5.11 MIL/uL   Hemoglobin 14.7  12.0 - 15.0 g/dL   HCT 40.9  81.1 - 91.4 %   MCV 93.5  78.0 - 100.0 fL   MCH 31.8  26.0 -  34.0 pg   MCHC 34.0  30.0 - 36.0 g/dL   RDW 78.2  95.6 - 21.3 %   Platelets 233  150 - 400 K/uL  BASIC METABOLIC PANEL     Status: Abnormal   Collection Time    12/21/12  7:25 PM      Result Value Range   Sodium 139  135 - 145 mEq/L   Potassium 3.9  3.5 - 5.1 mEq/L   Chloride 102  96 - 112 mEq/L   CO2 24  19 -  32 mEq/L   Glucose, Bld 408 (*) 70 - 99 mg/dL   BUN 17  6 - 23 mg/dL   Creatinine, Ser 1.61  0.50 - 1.10 mg/dL   Calcium 9.8  8.4 - 09.6 mg/dL   GFR calc non Af Amer 64 (*) >90 mL/min   GFR calc Af Amer 74 (*) >90 mL/min   Comment: (NOTE)     The eGFR has been calculated using the CKD EPI equation.     This calculation has not been validated in all clinical situations.     eGFR's persistently <90 mL/min signify possible Chronic Kidney     Disease.  TROPONIN I     Status: None   Collection Time    12/21/12  7:25 PM      Result Value Range   Troponin I <0.30  <0.30 ng/mL   Comment:            Due to the release kinetics of cTnI,     a negative result within the first hours     of the onset of symptoms does not rule out     myocardial infarction with certainty.     If myocardial infarction is still suspected,     repeat the test at appropriate intervals.  PRO B NATRIURETIC PEPTIDE     Status: Abnormal   Collection Time    12/21/12  7:25 PM      Result Value Range   Pro B Natriuretic peptide (BNP) 298.8 (*) 0 - 125 pg/mL   Dg Chest Port 1 View  12/21/2012   CLINICAL DATA:  Chest pain, left arm pain, shortness of breath.  EXAM: PORTABLE CHEST - 1 VIEW  COMPARISON:  08/01/2008  FINDINGS: The heart size and mediastinal contours are within normal limits. Both lungs are clear. The visualized skeletal structures are unremarkable.  IMPRESSION: No active disease.   Electronically Signed   By: Charlett Nose M.D.   On: 12/21/2012 19:54    ECG:  WCT HR 164, suspect atrial flutter, prior ecgs are NSR with LBBB Telemetry: now back to sinus rhythm with LBBB, HR  60s.  Assessment: WCT suspect Atrial flutter, has baseline LBBB Asthma/Allergic Rhinitis on prednisone taper OSA not on CPAP Hyperglycemia  Plan:  1. Cardiology Admission  2. Continuous monitoring on Telemetry. 3. Repeat ekg on admit, prn chest pain or arrythmia 4. Trend cardiac biomarkers, hgba1c, tsh 5. Medical management to include ASA, Continue Amiodarone gtt 6. Complete Pred taper, SSI

## 2012-12-21 NOTE — ED Provider Notes (Addendum)
CSN: [id number]     Arrival date & time 12/21/12  1900 History   This chart was scribed for Dagmar Hait, MD by Caryn Bee, ED Scribe. This patient was seen in room MHT14/MHT14 and the patient's care was started 7:18 PM.    Chief Complaint  Patient presents with  . Chest Pain   Patient is a 67 y.o. female presenting with chest pain. The history is provided by the patient. No language interpreter was used.  Chest Pain Chest pain location: Central. Pain quality: pressure   Pain radiates to:  L arm Pain radiates to the back: no   Pain severity:  Mild Onset quality:  Sudden Duration:  1 hour Timing:  Constant Progression:  Improving Chronicity:  New Relieved by:  Aspirin Worsened by:  Nothing tried Ineffective treatments:  None tried Associated symptoms: palpitations   Associated symptoms: no nausea, no shortness of breath and not vomiting   Risk factors: no aortic disease, no coronary artery disease and no smoking    HPI Comments: Marcia Norris is a 67 y.o. female with h/o bundle branch block who presents to the Emergency Department complaining of chest pain that began about 1 hour ago. She states that the pain feels like pressure and constant fluttering. She reports that the pressure does not currently radiate, but did radiate to her left arm earlier. Currently pt rates chest pressure as 4/10, improved from 10/10 earlier. Pt states that she started taking 20 mg Prednisone yesterday, and took 3 pills today. Pt has taken Prednisone previously but did not experience any symptoms. She will have cataract surgery on 12/26/2012, and was prescribed Prednisone to get rid of a cough before the surgery. Pt called her PCP office en route to ED. Pt does not take medications to regulate her heart rhythm. She denies nausea, vomiting, SOB. Pt has no h/o heart disease. She was originally referred to a cardiologist for SOB and he discovered a bundle branch block and mitral valve regurgitation.  Pt's father has h/o heart disease. Pt is not a smoker, but was exposed to second hand smoke.  Pt's PCP is Dr. Sherlie Ban.    Past Medical History  Diagnosis Date  . Hyperlipidemia   . Vitamin D deficiency   . Asthma   . Seasonal allergies   . OSA (obstructive sleep apnea) 02/16/2012   Past Surgical History  Procedure Laterality Date  . Partial hysterectomy     Family History  Problem Relation Age of Onset  . Cancer Mother   . Bladder Cancer Father   . Heart disease Father   . Allergies Mother    History  Substance Use Topics  . Smoking status: Never Smoker   . Smokeless tobacco: Not on file  . Alcohol Use: No   OB History   Grav Para Term Preterm Abortions TAB SAB Ect Mult Living                 Review of Systems  Respiratory: Negative for shortness of breath.   Cardiovascular: Positive for chest pain and palpitations.  Gastrointestinal: Negative for nausea and vomiting.  All other systems reviewed and are negative.    Allergies  Codeine  Home Medications   Current Outpatient Rx  Name  Route  Sig  Dispense  Refill  . PREDNISONE, PAK, PO   Oral   Take by mouth.         Marland Kitchen aspirin 81 MG tablet   Oral   Take 81 mg by  mouth daily.           Marland Kitchen atorvastatin (LIPITOR) 40 MG tablet   Oral   Take 40 mg by mouth daily.           Marland Kitchen azelastine (ASTELIN) 137 MCG/SPRAY nasal spray   Nasal   Place 1 spray into the nose as needed.          . budesonide-formoterol (SYMBICORT) 160-4.5 MCG/ACT inhaler   Inhalation   Inhale 2 puffs into the lungs 2 (two) times daily.           . Calcium Carbonate-Vitamin D (CALCIUM 600+D) 600-400 MG-UNIT per tablet   Oral   Take 1 tablet by mouth 2 (two) times daily.         . cholecalciferol (VITAMIN D) 400 UNITS TABS   Oral   Take 1,000 Units by mouth daily.           Marland Kitchen dextromethorphan-guaiFENesin (MUCINEX DM) 30-600 MG per 12 hr tablet   Oral   Take 1 tablet by mouth daily as needed.          . loratadine  (CLARITIN) 10 MG tablet   Oral   Take 10 mg by mouth as needed.          . Multiple Vitamins-Minerals (ONE-A-DAY 50 PLUS PO)   Oral   Take 1 tablet by mouth daily.           Marland Kitchen NASAL SALINE NA   Nasal   Place into the nose as needed.          . Omega-3 Fatty Acids (FISH OIL) 1200 MG CAPS   Oral   Take 1 capsule by mouth as needed.          Marland Kitchen PROAIR HFA 108 (90 BASE) MCG/ACT inhaler   Inhalation   Inhale 1 puff into the lungs every 4 (four) hours as needed.           Triage Vitals: BP 127/67  Pulse 98  Temp(Src) 98.5 F (36.9 C) (Oral)  Resp 18  Ht 5\' 7"  (1.702 m)  Wt 168 lb (76.204 kg)  BMI 26.31 kg/m2  SpO2 98%  Physical Exam  Nursing note and vitals reviewed. Constitutional: She is oriented to person, place, and time. She appears well-developed and well-nourished. No distress.  HENT:  Head: Normocephalic and atraumatic.  Eyes: EOM are normal.  Neck: Neck supple. No tracheal deviation present.  Cardiovascular: Normal rate.   Pulmonary/Chest: Effort normal. No respiratory distress.  Musculoskeletal: Normal range of motion.  Neurological: She is alert and oriented to person, place, and time.  Skin: Skin is warm and dry.  Psychiatric: She has a normal mood and affect. Her behavior is normal.    ED Course  Procedures (including critical care time) DIAGNOSTIC STUDIES: Oxygen Saturation is 98% on room air, normal by my interpretation.    COORDINATION OF CARE: 7:33 PM-Discussed treatment plan with pt at bedside and pt agreed to plan.   Labs Review Labs Reviewed  CBC - Abnormal; Notable for the following:    WBC 14.5 (*)    All other components within normal limits  BASIC METABOLIC PANEL - Abnormal; Notable for the following:    Glucose, Bld 408 (*)    GFR calc non Af Amer 64 (*)    GFR calc Af Amer 74 (*)    All other components within normal limits  PRO B NATRIURETIC PEPTIDE - Abnormal; Notable for the following:    Pro B  Natriuretic peptide (BNP)  298.8 (*)    All other components within normal limits  TROPONIN I   Imaging Review Dg Chest Port 1 View  12/21/2012   CLINICAL DATA:  Chest pain, left arm pain, shortness of breath.  EXAM: PORTABLE CHEST - 1 VIEW  COMPARISON:  08/01/2008  FINDINGS: The heart size and mediastinal contours are within normal limits. Both lungs are clear. The visualized skeletal structures are unremarkable.  IMPRESSION: No active disease.   Electronically Signed   By: Charlett Nose M.D.   On: 12/21/2012 19:54    EKG Interpretation     Ventricular Rate:  164 PR Interval:    QRS Duration: 120 QT Interval:  270 QTC Calculation: 445 R Axis:   46 Text Interpretation:  Wide QRS tachycardia Incomplete left bundle branch block Marked ST abnormality, possible inferior subendocardial injury Abnormal ECG LBBB on previous          Repeat EKG    Date: 12/21/2012  Rate: 96  Rhythm: normal sinus rhythm  QRS Axis: left  Intervals: normal  ST/T Wave abnormalities: normal  Conduction Disutrbances:left bundle branch block  Narrative Interpretation:   Old EKG Reviewed: unchanged  CRITICAL CARE Performed by: Dagmar Hait   Total critical care time: 30 minutes  Critical care time was exclusive of separately billable procedures and treating other patients.  Critical care was necessary to treat or prevent imminent or life-threatening deterioration.  Critical care was time spent personally by me on the following activities: development of treatment plan with patient and/or surrogate as well as nursing, discussions with consultants, evaluation of patient's response to treatment, examination of patient, obtaining history from patient or surrogate, ordering and performing treatments and interventions, ordering and review of laboratory studies, ordering and review of radiographic studies, pulse oximetry and re-evaluation of patient's condition.    MDM   1. Unstable angina   2. Atrial flutter     67 year old female presents with fluttering in her chest, chest pain. History of left bundle branch block and mitral regurgitation, otherwise no history of known coronary artery disease. Chest pain 4/10 upon arrival, was previously 10 out of 10 at home. Some associated shortness of breath. Pain is pressure-like and radiating to the left arm. She recently started taking prednisone. Patient had initial EKG showed wide complex tachycardia with rate in the 160s. I quickly with the return to see she previously had this left bundle branch block is not new. I was concerned with ventricular tachycardia initially, I went to see patient she was relaxing completely. Repeat EKG at that time with sinus rhythm with left bundle branch block. I spoke with cardiology he stated it was likely atrial flutter. He did recommend starting amiodarone, Lopressor, and admitting to the ICU. Labs show normal troponin mildly elevated BNP. Patient transferred to [facility]. Pain controlled with nitroglycerin SL, nitro paste placed.  I personally performed the services described in this documentation, which was scribed in my presence. The recorded information has been reviewed and is accurate.        Dagmar Hait, MD 12/21/12 2300

## 2012-12-22 DIAGNOSIS — I200 Unstable angina: Secondary | ICD-10-CM

## 2012-12-22 DIAGNOSIS — I4892 Unspecified atrial flutter: Secondary | ICD-10-CM | POA: Diagnosis present

## 2012-12-22 LAB — HEMOGLOBIN A1C
Hgb A1c MFr Bld: 6.9 % — ABNORMAL HIGH (ref <5.7)
Mean Plasma Glucose: 151 mg/dL — ABNORMAL HIGH (ref <117)

## 2012-12-22 LAB — PROTIME-INR
INR: 0.99 (ref 0.00–1.49)
Prothrombin Time: 12.9 seconds (ref 11.6–15.2)

## 2012-12-22 LAB — CBC
MCV: 94.6 fL (ref 78.0–100.0)
Platelets: 187 10*3/uL (ref 150–400)
RDW: 13.3 % (ref 11.5–15.5)
WBC: 9.9 10*3/uL (ref 4.0–10.5)

## 2012-12-22 LAB — MAGNESIUM: Magnesium: 2.1 mg/dL (ref 1.5–2.5)

## 2012-12-22 LAB — GLUCOSE, CAPILLARY
Glucose-Capillary: 307 mg/dL — ABNORMAL HIGH (ref 70–99)
Glucose-Capillary: 134 mg/dL — ABNORMAL HIGH (ref 70–99)

## 2012-12-22 LAB — BASIC METABOLIC PANEL
Calcium: 8.6 mg/dL (ref 8.4–10.5)
Creatinine, Ser: 0.91 mg/dL (ref 0.50–1.10)
GFR calc Af Amer: 73 mL/min — ABNORMAL LOW (ref >90)
GFR calc non Af Amer: 63 mL/min — ABNORMAL LOW (ref >90)

## 2012-12-22 LAB — TSH: TSH: 3.595 u[IU]/mL (ref 0.350–4.500)

## 2012-12-22 NOTE — Care Management Note (Signed)
    Page 1 of 1   12/22/2012     8:04:10 AM   CARE MANAGEMENT NOTE 12/22/2012  Patient:  Marcia Norris, Marcia Norris   Account Number:  [id number]  Date Initiated:  12/22/2012  Documentation initiated by:  Junius Creamer  Subjective/Objective Assessment:   adm w at flutter     Action/Plan:   lives w husband, pcp dr Casimiro Needle badger   Anticipated DC Date:     Anticipated DC Plan:        DC Planning Services  CM consult      Choice offered to / List presented to:             Status of service:   Medicare Important Message given?   (If response is "NO", the following Medicare IM given date fields will be blank) Date Medicare IM given:   Date Additional Medicare IM given:    Discharge Disposition:    Per UR Regulation:  Reviewed for med. necessity/level of care/duration of stay  If discussed at Long Length of Stay Meetings, dates discussed:    Comments:

## 2012-12-22 NOTE — Progress Notes (Signed)
Patient Name: Marcia Norris Date of Encounter: 12/22/2012  Principal Problem:   Atrial flutter with rapid ventricular response Active Problems:   HYPERTENSION, BENIGN    SUBJECTIVE: No palpitations, resp much better on steroids/nebs x 2 days. No chest pain.  OBJECTIVE Filed Vitals:   12/22/12 0600 12/22/12 0602 12/22/12 0700 12/22/12 0720  BP:  124/45  126/64  Pulse: 72 72 57 57  Temp:      TempSrc:      Resp: 26 17 14 16   Height:      Weight:      SpO2: 99% 99% 95% 98%    Intake/Output Summary (Last 24 hours) at 12/22/12 0851 Last data filed at 12/22/12 0700  Gross per 24 hour  Intake  266.7 ml  Output    800 ml  Net -533.3 ml   Filed Weights   12/21/12 1910 12/21/12 2138  Weight: 168 lb (76.204 kg) 139 lb 5.3 oz (63.2 kg)    PHYSICAL EXAM General: Well developed, well nourished, female in no acute distress. Head: Normocephalic, atraumatic.  Neck: Supple without bruits, JVD not elevated. Lungs:  Resp regular and unlabored, bibasilar rales. Heart: RRR, S1, S2, no S3, S4, or murmur; no rub. Abdomen: Soft, non-tender, non-distended, BS + x 4.  Extremities: No clubbing, cyanosis, no edema.  Neuro: Alert and oriented X 3. Moves all extremities spontaneously. Psych: Normal affect.  LABS: CBC:  Recent Labs  12/21/12 1925 12/22/12 0547  WBC 14.5* 9.9  HGB 14.7 13.3  HCT 43.2 40.0  MCV 93.5 94.6  PLT 233 187   INR:  Recent Labs  12/22/12 0547  INR 0.99   Basic Metabolic Panel:  Recent Labs  47/82/95 1925 12/22/12 0547  NA 139 142  K 3.9 3.7  CL 102 105  CO2 24 30  GLUCOSE 408* 145*  BUN 17 18  CREATININE 0.90 0.91  CALCIUM 9.8 8.6  MG  --  2.1   Cardiac Enzymes:  Recent Labs  12/21/12 1925 12/21/12 2358 12/22/12 0547  TROPONINI <0.30 <0.30 <0.30    BNP: Pro B Natriuretic peptide (BNP)  Date/Time Value Range Status  12/21/2012  7:25 PM 298.8* 0 - 125 pg/mL Final   TELE:   SR. LBBB is old   Radiology/Studies: Dg  Chest Port 1 View  12/21/2012   CLINICAL DATA:  Chest pain, left arm pain, shortness of breath.  EXAM: PORTABLE CHEST - 1 VIEW  COMPARISON:  08/01/2008  FINDINGS: The heart size and mediastinal contours are within normal limits. Both lungs are clear. The visualized skeletal structures are unremarkable.  IMPRESSION: No active disease.   Electronically Signed   By: Charlett Nose M.D.   On: 12/21/2012 19:54     Current Medications:  . atorvastatin  40 mg Oral Daily  . budesonide-formoterol  2 puff Inhalation BID  . calcium-vitamin D  1 tablet Oral BID  . cholecalciferol  1,000 Units Oral Daily  . heparin  5,000 Units Subcutaneous Q8H  . insulin aspart  0-5 Units Subcutaneous QHS  . insulin aspart  0-9 Units Subcutaneous TID WC  . pantoprazole  40 mg Oral Daily  . predniSONE  60 mg Oral Daily  . sodium chloride  3 mL Intravenous Q12H   . amiodarone (NEXTERONE PREMIX) 360 mg/200 mL dextrose 30 mg/hr (12/22/12 0200)    ASSESSMENT AND PLAN: Principal Problem:   Atrial flutter with rapid ventricular response - spontaneously converted to SR, on amio gtt, will feed, MD advise  on changing Amio to PO vs D/C and adding Cardizem. MD to review ECG but believe Aflutter RVR w/ LBBB, not VT  Active Problems:   HYPERTENSION, BENIGN - follow.   Melida Quitter , PA-C 8:51 AM 12/22/2012  Cardiollogy Attending  Patient seen and examined. She has reverted back to NSR and is ready for discharge. I would like to see her back in the office to discuss ablation. Would not send home with amio.  [name]

## 2012-12-22 NOTE — Progress Notes (Signed)
Pt given all discharge instructions using teachback method. Pt taught back and verbalized all instructions given. IVs removed. Pt taken to pt pick up via WC by nurse tech.

## 2012-12-22 NOTE — Discharge Summary (Signed)
CARDIOLOGY DISCHARGE SUMMARY   Patient ID: Marcia Norris MRN: [id number] DOB/AGE: 05/22/1945 67 y.o.  Admit date: 12/21/2012 Discharge date: 12/22/2012  Primary Discharge Diagnosis:   Atrial flutter with rapid ventricular response   Secondary Discharge Diagnosis:    HYPERTENSION, BENIGN  Procedures:  CXR  Hospital Course: Marcia Norris is a 67 y.o. female with no history of CAD. She has a history of LBBB, and a normal Adenosine MV in Feb. 2014. She had recently started steroids for a URI. She developed chest pain and presyncope after dinner last pm. Was found to be in WCT at [facility], started on IV Amiodarone and transferred to [facility]. She was admitted for further evaluation and treatment.   The ECG was reviewed and felt to be atrial flutter with underlying LBBB. She spontaneously converted to SR overnight. Her cardiac enzymes were negative for MI. Once she was in SR, her symptoms all resolved. There were no significant abnormalities on her labs except hyperglycemia, for which she can follow up with her primary MD.   On 10/23, she was seen by Dr. Ladona Ridgel. He evaluated her and reviewed all data. He felt the atrial flutter was transient in the setting of an acute illness, steroids and nebs. The amiodarone is discontinued. She is stable for discharge home, to complete her URI therapy as an outpatient and follow up with Dr. Ladona Ridgel for consideration of ablation.  Labs:   Lab Results  Component Value Date   WBC 9.9 12/22/2012   HGB 13.3 12/22/2012   HCT 40.0 12/22/2012   MCV 94.6 12/22/2012   PLT 187 12/22/2012     Recent Labs Lab 12/22/12 0547  NA 142  K 3.7  CL 105  CO2 30  BUN 18  CREATININE 0.91  CALCIUM 8.6  GLUCOSE 145*    Recent Labs  12/21/12 1925 12/21/12 2358 12/22/12 0547  TROPONINI <0.30 <0.30 <0.30   Pro B Natriuretic peptide (BNP)  Date/Time Value Range Status  12/21/2012  7:25 PM 298.8* 0 - 125 pg/mL Final    Recent Labs  12/22/12 0547    INR 0.99       Radiology: Dg Chest Port 1 View 12/21/2012   CLINICAL DATA:  Chest pain, left arm pain, shortness of breath.  EXAM: PORTABLE CHEST - 1 VIEW  COMPARISON:  08/01/2008  FINDINGS: The heart size and mediastinal contours are within normal limits. Both lungs are clear. The visualized skeletal structures are unremarkable.  IMPRESSION: No active disease.   Electronically Signed   By: Charlett Nose M.D.   On: 12/21/2012 19:54   EKG: Atrial flutter, aberrant conduction 2nd chronic LBBB, rate 164.  FOLLOW UP PLANS AND APPOINTMENTS Allergies  Allergen Reactions  . Codeine Nausea Only     Medication List         albuterol 108 (90 BASE) MCG/ACT inhaler  Commonly known as:  PROVENTIL HFA;VENTOLIN HFA  Inhale 2 puffs into the lungs every 6 (six) hours as needed for wheezing.     ALPRAZolam 0.5 MG tablet  Commonly known as:  XANAX  Take 0.25-0.5 mg by mouth at bedtime as needed for sleep.     aspirin 81 MG tablet  Take 81 mg by mouth daily.     atorvastatin 40 MG tablet  Commonly known as:  LIPITOR  Take 40 mg by mouth daily.     azelastine 137 MCG/SPRAY nasal spray  Commonly known as:  ASTELIN  Place 1 spray into the nose daily  as needed for rhinitis.     budesonide-formoterol 160-4.5 MCG/ACT inhaler  Commonly known as:  SYMBICORT  Inhale 2 puffs into the lungs 2 (two) times daily.     CALCIUM 600+D 600-400 MG-UNIT per tablet  Generic drug:  Calcium Carbonate-Vitamin D  Take 1 tablet by mouth 2 (two) times daily.     cholecalciferol 1000 UNITS tablet  Commonly known as:  VITAMIN D  Take 1,000 Units by mouth daily.     loratadine 10 MG tablet  Commonly known as:  CLARITIN  Take 10 mg by mouth daily as needed for allergies.     omeprazole 20 MG capsule  Commonly known as:  PRILOSEC  Take 20 mg by mouth daily.     ONE-A-DAY 50 PLUS PO  Take 1 tablet by mouth daily.     predniSONE 20 MG tablet  Commonly known as:  DELTASONE  Take 20-60 mg by mouth daily. Take  60mg  for 3 days, then 40mg  for 3 days, then 20mg  for 3 days        Discharge Orders   Future Appointments Provider Department Dept Phone   01/10/2013 12:00 PM Marinus Maw, MD [facility] Office [phone]   02/09/2013 10:00 AM [facility] Echo Pv 3 [facility] S[facility] 3 ECHO LAB [phone]   Future Orders Complete By Expires   Diet - low sodium heart healthy  As directed    Increase activity slowly  As directed      Follow-up Information   Follow up with Lewayne Bunting, MD On 01/10/2013. (At 12:00 noon )    Specialty:  Cardiology   Contact information:   [address] [place] [address] [phone]       BRING ALL MEDICATIONS WITH YOU TO FOLLOW UP APPOINTMENTS  Time spent with patient to include physician time: 38 min Signed: Theodore Demark, PA-C 12/22/2012, 1:43 PM Co-Sign MD

## 2013-01-05 ENCOUNTER — Other Ambulatory Visit: Payer: Self-pay

## 2013-01-10 ENCOUNTER — Ambulatory Visit (INDEPENDENT_AMBULATORY_CARE_PROVIDER_SITE_OTHER): Payer: Medicare Other | Admitting: Internal Medicine

## 2013-01-10 ENCOUNTER — Encounter: Payer: Self-pay | Admitting: Internal Medicine

## 2013-01-10 VITALS — BP 152/81 | HR 73 | Ht 66.0 in | Wt 174.8 lb

## 2013-01-10 DIAGNOSIS — I10 Essential (primary) hypertension: Secondary | ICD-10-CM

## 2013-01-10 DIAGNOSIS — I447 Left bundle-branch block, unspecified: Secondary | ICD-10-CM

## 2013-01-10 DIAGNOSIS — I4892 Unspecified atrial flutter: Secondary | ICD-10-CM

## 2013-01-10 DIAGNOSIS — I059 Rheumatic mitral valve disease, unspecified: Secondary | ICD-10-CM

## 2013-01-10 DIAGNOSIS — I340 Nonrheumatic mitral (valve) insufficiency: Secondary | ICD-10-CM

## 2013-01-10 DIAGNOSIS — F331 Major depressive disorder, recurrent, moderate: Secondary | ICD-10-CM | POA: Insufficient documentation

## 2013-01-10 DIAGNOSIS — Z0000 Encounter for general adult medical examination without abnormal findings: Secondary | ICD-10-CM

## 2013-01-10 NOTE — Patient Instructions (Signed)
Your physician recommends that you continue on your current medications as directed. Please refer to the Current Medication list given to you today.  Your physician has recommended that you have an ablation. Catheter ablation is a medical procedure used to treat some cardiac arrhythmias (irregular heartbeats). During catheter ablation, a long, thin, flexible tube is put into a blood vessel in your groin (upper thigh), or neck. This tube is called an ablation catheter. It is then guided to your heart through the blood vessel. Radio frequency waves destroy small areas of heart tissue where abnormal heartbeats may cause an arrhythmia to start. Please see the instruction sheet given to you today. Please Call to schedule this procedure Dr. Ladona Ridgel is available on these days: 11/17, 11/20, 11/24, 01/27/2013.

## 2013-01-10 NOTE — Progress Notes (Signed)
    HPI Marcia Norris is referred today for evaluation of recurrent wide QRS tachycardia.  She is a very pleasant 67-year-old woman with a history of tachycardia palpitations who is admitted to the hospital several weeks ago with a wide QRS tachycardia at 160 beats per minute. It was thought that the patient had atrial flutter. She was treated with amiodarone and her tachycardia resolved. She has had occasional palpitations since but for the most part her arrhythmias have been well-controlled. She denies chest pain or shortness of breath. No syncope. Allergies  Allergen Reactions  . Codeine Nausea Only     Current Outpatient Prescriptions  Medication Sig Dispense Refill  . albuterol (PROVENTIL HFA;VENTOLIN HFA) 108 (90 BASE) MCG/ACT inhaler Inhale 2 puffs into the lungs every 6 (six) hours as needed for wheezing.      . ALPRAZolam (XANAX) 0.5 MG tablet Take 0.25-0.5 mg by mouth at bedtime as needed for sleep.      . aspirin 81 MG tablet Take 81 mg by mouth daily.       . atorvastatin (LIPITOR) 40 MG tablet Take 40 mg by mouth daily.        . azelastine (ASTELIN) 137 MCG/SPRAY nasal spray Place 1 spray into the nose daily as needed for rhinitis.       . budesonide-formoterol (SYMBICORT) 160-4.5 MCG/ACT inhaler Inhale 2 puffs into the lungs 2 (two) times daily.        . Calcium Carbonate-Vitamin D (CALCIUM 600+D) 600-400 MG-UNIT per tablet Take 1 tablet by mouth 2 (two) times daily.      . cholecalciferol (VITAMIN D) 1000 UNITS tablet Take 1,000 Units by mouth daily.      . loratadine (CLARITIN) 10 MG tablet Take 10 mg by mouth daily as needed for allergies.       . Multiple Vitamins-Minerals (ONE-A-DAY 50 PLUS PO) Take 1 tablet by mouth daily.         No current facility-administered medications for this visit.     Past Medical History  Diagnosis Date  . Hyperlipidemia   . Vitamin D deficiency   . Asthma   . Seasonal allergies   . OSA (obstructive sleep apnea) 02/16/2012     ROS:   All systems reviewed and negative except as noted in the HPI.   Past Surgical History  Procedure Laterality Date  . Partial hysterectomy       Family History  Problem Relation Age of Onset  . Cancer Mother   . Bladder Cancer Father   . Heart disease Father   . Allergies Mother      History   Social History  . Marital Status: Married    Spouse Name: N/A    Number of Children: N/A  . Years of Education: N/A   Occupational History  . retired    Social History Main Topics  . Smoking status: Never Smoker   . Smokeless tobacco: Not on file  . Alcohol Use: No  . Drug Use: No  . Sexual Activity: Not on file   Other Topics Concern  . Not on file   Social History Narrative  . No narrative on file     BP 152/81  Pulse 73  Ht 5' 6" (1.676 m)  Wt 174 lb 12.8 oz (79.289 kg)  BMI 28.23 kg/m2  Physical Exam:  Well appearing  67-year-old woman, NAD HEENT: Unremarkable Neck:   7 cm JVD, no thyromegally Back:  No CVA tenderness Lungs:  Clear    With no wheezes, rales, or rhonchi. HEART:  Regular rate rhythm, no murmurs, no rubs, no clicks , split S2 Abd:  soft, positive bowel sounds, no organomegally, no rebound, no guarding Ext:  2 plus pulses, no edema, no cyanosis, no clubbing Skin:  No rashes no nodules Neuro:  CN II through XII intact, motor grossly intact  EKG -  Normal sinus rhythm with left bundle branch block, and left atrial enlargement   Assess/Plan: 

## 2013-01-10 NOTE — Assessment & Plan Note (Signed)
Her systolic blood pressure is slightly elevated. She notes that at home it is much better. She'll continue her current medical therapy, and maintain a low-sodium diet.

## 2013-01-10 NOTE — Assessment & Plan Note (Signed)
I've discussed the treatment options with the patient in detail. The risk, goals, benefits, and alternatives to catheter ablation have been discussed in detail. She will go fourth with watchful waiting. She would like to call as if she decides to proceed with catheter ablation.

## 2013-01-11 ENCOUNTER — Telehealth: Payer: Self-pay | Admitting: Internal Medicine

## 2013-01-11 NOTE — Telephone Encounter (Signed)
New problem   Pt was told by Dr Ladona Ridgel to call nurse about scheduling an ablation. Please call pt

## 2013-01-12 NOTE — Telephone Encounter (Signed)
Spoke with patient and this is scheduled for 11/28 with labs on 11/21

## 2013-01-16 ENCOUNTER — Encounter: Payer: Self-pay | Admitting: Gastroenterology

## 2013-01-17 ENCOUNTER — Encounter: Payer: Self-pay | Admitting: *Deleted

## 2013-01-17 ENCOUNTER — Other Ambulatory Visit: Payer: Self-pay | Admitting: *Deleted

## 2013-01-17 DIAGNOSIS — I4892 Unspecified atrial flutter: Secondary | ICD-10-CM

## 2013-01-19 ENCOUNTER — Encounter (HOSPITAL_COMMUNITY): Payer: Self-pay | Admitting: Pharmacy Technician

## 2013-01-20 ENCOUNTER — Other Ambulatory Visit (INDEPENDENT_AMBULATORY_CARE_PROVIDER_SITE_OTHER): Payer: Medicare Other

## 2013-01-20 DIAGNOSIS — I4892 Unspecified atrial flutter: Secondary | ICD-10-CM

## 2013-01-20 LAB — CBC WITH DIFFERENTIAL/PLATELET
Basophils Relative: 0.5 % (ref 0.0–3.0)
Eosinophils Relative: 3.3 % (ref 0.0–5.0)
Hemoglobin: 14.3 g/dL (ref 12.0–15.0)
MCV: 91.4 fl (ref 78.0–100.0)
Monocytes Absolute: 0.4 10*3/uL (ref 0.1–1.0)
Monocytes Relative: 7.9 % (ref 3.0–12.0)
Neutro Abs: 2.3 10*3/uL (ref 1.4–7.7)
Neutrophils Relative %: 45.4 % (ref 43.0–77.0)
Platelets: 240 10*3/uL (ref 150.0–400.0)
RBC: 4.55 Mil/uL (ref 3.87–5.11)
WBC: 5.1 10*3/uL (ref 4.5–10.5)

## 2013-01-20 LAB — BASIC METABOLIC PANEL
CO2: 29 mEq/L (ref 19–32)
Chloride: 102 mEq/L (ref 96–112)
Creatinine, Ser: 0.8 mg/dL (ref 0.4–1.2)
Potassium: 3.7 mEq/L (ref 3.5–5.1)
Sodium: 138 mEq/L (ref 135–145)

## 2013-01-24 ENCOUNTER — Telehealth: Payer: Self-pay | Admitting: Internal Medicine

## 2013-01-24 NOTE — Telephone Encounter (Signed)
New Problem:  Pt states she has a "scratchy throat" and she wants the doctor to be aware since she is scheduled for a cath on Friday. Pt wants to know if it will pose any problems/issues. Please advise

## 2013-01-24 NOTE — Telephone Encounter (Signed)
Spoke with patient, labs are normal.  No fever.

## 2013-01-27 ENCOUNTER — Encounter (HOSPITAL_COMMUNITY): Payer: Self-pay | Admitting: General Practice

## 2013-01-27 ENCOUNTER — Ambulatory Visit (HOSPITAL_COMMUNITY)
Admission: RE | Admit: 2013-01-27 | Discharge: 2013-01-27 | Disposition: A | Discharge status: Home / Self Care | Payer: Medicare Other | Source: Ambulatory Visit | Attending: Internal Medicine | Admitting: Internal Medicine

## 2013-01-27 ENCOUNTER — Encounter (HOSPITAL_COMMUNITY)
Admission: RE | Disposition: A | Discharge status: Home / Self Care | Payer: Self-pay | Source: Ambulatory Visit | Attending: Internal Medicine

## 2013-01-27 DIAGNOSIS — J45909 Unspecified asthma, uncomplicated: Secondary | ICD-10-CM | POA: Insufficient documentation

## 2013-01-27 DIAGNOSIS — G4733 Obstructive sleep apnea (adult) (pediatric): Secondary | ICD-10-CM | POA: Insufficient documentation

## 2013-01-27 DIAGNOSIS — I4892 Unspecified atrial flutter: Secondary | ICD-10-CM | POA: Insufficient documentation

## 2013-01-27 DIAGNOSIS — Z885 Allergy status to narcotic agent status: Secondary | ICD-10-CM | POA: Insufficient documentation

## 2013-01-27 DIAGNOSIS — I471 Supraventricular tachycardia: Secondary | ICD-10-CM | POA: Diagnosis present

## 2013-01-27 DIAGNOSIS — I447 Left bundle-branch block, unspecified: Secondary | ICD-10-CM | POA: Insufficient documentation

## 2013-01-27 DIAGNOSIS — E785 Hyperlipidemia, unspecified: Secondary | ICD-10-CM | POA: Insufficient documentation

## 2013-01-27 DIAGNOSIS — Z8679 Personal history of other diseases of the circulatory system: Secondary | ICD-10-CM

## 2013-01-27 DIAGNOSIS — R000 Tachycardia, unspecified: Secondary | ICD-10-CM | POA: Insufficient documentation

## 2013-01-27 DIAGNOSIS — I499 Cardiac arrhythmia, unspecified: Secondary | ICD-10-CM | POA: Insufficient documentation

## 2013-01-27 DIAGNOSIS — E559 Vitamin D deficiency, unspecified: Secondary | ICD-10-CM | POA: Insufficient documentation

## 2013-01-27 DIAGNOSIS — I10 Essential (primary) hypertension: Secondary | ICD-10-CM | POA: Insufficient documentation

## 2013-01-27 DIAGNOSIS — Z90710 Acquired absence of both cervix and uterus: Secondary | ICD-10-CM | POA: Insufficient documentation

## 2013-01-27 DIAGNOSIS — I119 Hypertensive heart disease without heart failure: Secondary | ICD-10-CM | POA: Diagnosis present

## 2013-01-27 HISTORY — DX: Shortness of breath: R06.02

## 2013-01-27 HISTORY — DX: Unspecified atrial flutter: I48.92

## 2013-01-27 HISTORY — PX: ATRIAL FLUTTER ABLATION: SHX5733

## 2013-01-27 HISTORY — DX: Obstructive sleep apnea (adult) (pediatric): G47.33

## 2013-01-27 HISTORY — DX: Dependence on other enabling machines and devices: Z99.89

## 2013-01-27 HISTORY — DX: Personal history of other diseases of the circulatory system: Z86.79

## 2013-01-27 SURGERY — ATRIAL FLUTTER ABLATION
Anesthesia: LOCAL

## 2013-01-27 MED ORDER — MIDAZOLAM HCL 5 MG/5ML IJ SOLN
INTRAMUSCULAR | Status: AC
  Filled 2013-01-27: qty 5

## 2013-01-27 MED ORDER — VITAMIN D3 25 MCG (1000 UNIT) PO TABS: 1000.0000 [IU] | ORAL_TABLET | Freq: Every day | ORAL | Status: DC

## 2013-01-27 MED ORDER — ALBUTEROL SULFATE HFA 108 (90 BASE) MCG/ACT IN AERS
2.0000 | INHALATION_SPRAY | Freq: Four times a day (QID) | RESPIRATORY_TRACT | Status: DC | PRN
  Filled 2013-01-27: qty 6.7

## 2013-01-27 MED ORDER — ONDANSETRON HCL 4 MG/2ML IJ SOLN: 4.0000 mg | Freq: Four times a day (QID) | INTRAMUSCULAR | Status: DC | PRN

## 2013-01-27 MED ORDER — SODIUM CHLORIDE 0.9 % IJ SOLN
3.0000 mL | Freq: Two times a day (BID) | INTRAMUSCULAR | Status: DC
  Administered 2013-01-27: 3 mL via INTRAVENOUS

## 2013-01-27 MED ORDER — FENTANYL CITRATE 0.05 MG/ML IJ SOLN
INTRAMUSCULAR | Status: AC
  Filled 2013-01-27: qty 2

## 2013-01-27 MED ORDER — BUDESONIDE-FORMOTEROL FUMARATE 160-4.5 MCG/ACT IN AERO
2.0000 | INHALATION_SPRAY | Freq: Two times a day (BID) | RESPIRATORY_TRACT | Status: DC
  Filled 2013-01-27: qty 6

## 2013-01-27 MED ORDER — ATORVASTATIN CALCIUM 40 MG PO TABS: 40.0000 mg | ORAL_TABLET | Freq: Every day | ORAL | Status: DC

## 2013-01-27 MED ORDER — SODIUM CHLORIDE 0.9 % IJ SOLN: 3.0000 mL | INTRAMUSCULAR | Status: DC | PRN

## 2013-01-27 MED ORDER — BUPIVACAINE HCL (PF) 0.25 % IJ SOLN
INTRAMUSCULAR | Status: AC
  Filled 2013-01-27: qty 60

## 2013-01-27 MED ORDER — ALPRAZOLAM 0.25 MG PO TABS: 0.2500 mg | ORAL_TABLET | Freq: Every evening | ORAL | Status: DC | PRN

## 2013-01-27 MED ORDER — ACETAMINOPHEN 325 MG PO TABS: 650.0000 mg | ORAL_TABLET | ORAL | Status: DC | PRN

## 2013-01-27 MED ORDER — OFF THE BEAT BOOK
Freq: Once | Status: AC
  Administered 2013-01-27: 18:00:00
  Filled 2013-01-27: qty 1

## 2013-01-27 MED ORDER — SODIUM CHLORIDE 0.9 % IV SOLN: 250.0000 mL | INTRAVENOUS | Status: DC | PRN

## 2013-01-27 MED ORDER — LORATADINE 10 MG PO TABS
10.0000 mg | ORAL_TABLET | Freq: Every day | ORAL | Status: DC | PRN
  Filled 2013-01-27: qty 1

## 2013-01-27 NOTE — Interval H&P Note (Signed)
History and Physical Interval Note:  01/27/2013 7:54 AM  Marcia Norris  has presented today for surgery, with the diagnosis of aflutter  The various methods of treatment have been discussed with the patient and family. After consideration of risks, benefits and other options for treatment, the patient has consented to  Procedure(s): ATRIAL FLUTTER ABLATION (N/A) as a surgical intervention .  The patient's history has been reviewed, patient examined, no change in status, stable for surgery.  I have reviewed the patient's chart and labs.  Questions were answered to the patient's satisfaction.     [name]

## 2013-01-27 NOTE — Progress Notes (Signed)
Dr Ladona Ridgel advised patient currently has cough with cold.  States she has green mucous from nose.  Currently afebrile and has had no fever since onset of symptoms.  Taking amoxicillin from PCP which she began yesterday 01/26/2013.  No further orders recieved

## 2013-01-27 NOTE — Discharge Summary (Signed)
Discharge Summary   Patient ID: Marcia Norris,  MRN: [id number], DOB/AGE: 69/01/1946 69 y.o.  Admit date: 01/27/2013 Discharge date: 01/27/2013  Primary Care Provider: Eartha Inch Primary Cardiologist: Golden Circle, MD  Electrophysiologist: Reece Agar. Ladona Ridgel, MD   Discharge Diagnoses Principal Problem:   Paroxysmal supraventricular tachycardia  **s/p EP study and successful radiofrequency catheter ablation this admission. Active Problems:   HYPERLIPIDEMIA   HYPERTENSION, BENIGN   Allergies Allergies  Allergen Reactions  . Codeine Nausea Only   Procedures  EP Study with Radiofrequency Catheter Ablation for AV nodal re-entrant tachycardia 11.28.2014  CONCLUSION:  This study demonstrates successful electrophysiologic study of 2 specific SVTs, 1 AV reentrant tachycardia utilizing a concealed and decrementally conducting posteroseptal pathway and the other utilizing dual AV nodal physiology with both fast and slow pathway conduction in Koch's triangle. _____________   History of Present Illness  67 y/o female with h/o tachypalpitations and documented wide complex SVT.  She was recently seen by Dr. Ladona Ridgel and decision was made to pursue EP study and catheter ablation.  Hospital Course  Patient presented to the [facility] EP lab on 11/28 and underwent EP study with finding of two specific SVT's. Catheter ablation was successfully carried out without complication.  Patient tolerated procedure well and post-procedure has been ambulating without difficulty.  She will be discharged home this evening in good condition.  Discharge Vitals Blood pressure 148/62, pulse 70, temperature 97.8 F (36.6 C), temperature source Oral, resp. rate 20, height 5' 6.5" (1.689 m), weight 173 lb (78.472 kg), SpO2 97.00%.  Filed Weights   01/27/13 0558  Weight: 173 lb (78.472 kg)   Labs  None  Disposition  Pt is being discharged home today in good condition.  Follow-up Plans &  Appointments  Follow-up Information   Follow up with EDMISTEN, BROOKE, PA-C On 02/28/2013. (10:00 AM - Dr. Lubertha Basque PA)    Specialty:  Cardiology   Contact information:   [address] [place] [address] [phone]       Follow up with Eartha Inch, MD. (as scheduled)    Specialty:  Family Medicine   Contact information:   [address] [place] [address] [phone]       Discharge Medications    Medication List         albuterol 108 (90 BASE) MCG/ACT inhaler  Commonly known as:  PROVENTIL HFA;VENTOLIN HFA  Inhale 2 puffs into the lungs every 6 (six) hours as needed for wheezing.     ALPRAZolam 0.5 MG tablet  Commonly known as:  XANAX  Take 0.25-0.5 mg by mouth at bedtime as needed for sleep.     aspirin 81 MG tablet  Take 81 mg by mouth daily.     atorvastatin 40 MG tablet  Commonly known as:  LIPITOR  Take 40 mg by mouth daily.     azelastine 137 MCG/SPRAY nasal spray  Commonly known as:  ASTELIN  Place 1 spray into the nose daily as needed for rhinitis.     budesonide-formoterol 160-4.5 MCG/ACT inhaler  Commonly known as:  SYMBICORT  Inhale 2 puffs into the lungs 2 (two) times daily.     CALCIUM 600+D 600-400 MG-UNIT per tablet  Generic drug:  Calcium Carbonate-Vitamin D  Take 2 tablets by mouth 2 (two) times daily.     cholecalciferol 1000 UNITS tablet  Commonly known as:  VITAMIN D  Take 1,000 Units by mouth daily.     ILEVRO 0.3 % Susp  Generic drug:  Nepafenac  Place 1 drop into the left eye at bedtime.     loratadine 10 MG tablet  Commonly known as:  CLARITIN  Take 10 mg by mouth daily as needed for allergies.     ONE-A-DAY 50 PLUS PO  Take 1 tablet by mouth daily.       Outstanding Labs/Studies  None  Duration of Discharge Encounter   Greater than 30 minutes including physician time.  Signed, Nicolasa Ducking NP 01/27/2013, 3:16 PM

## 2013-01-27 NOTE — CV Procedure (Signed)
EPS/RFA of AVRT using a decrementally conducting postero-septal AP along with AVNRT without immediate complication. [id number].

## 2013-01-27 NOTE — H&P (View-Only) (Signed)
HPI Marcia Norris is referred today for evaluation of recurrent wide QRS tachycardia.  She is a very pleasant 67 year old woman with a history of tachycardia palpitations who is admitted to the hospital several weeks ago with a wide QRS tachycardia at 160 beats per minute. It was thought that the patient had atrial flutter. She was treated with amiodarone and her tachycardia resolved. She has had occasional palpitations since but for the most part her arrhythmias have been well-controlled. She denies chest pain or shortness of breath. No syncope. Allergies  Allergen Reactions  . Codeine Nausea Only     Current Outpatient Prescriptions  Medication Sig Dispense Refill  . albuterol (PROVENTIL HFA;VENTOLIN HFA) 108 (90 BASE) MCG/ACT inhaler Inhale 2 puffs into the lungs every 6 (six) hours as needed for wheezing.      Marland Kitchen ALPRAZolam (XANAX) 0.5 MG tablet Take 0.25-0.5 mg by mouth at bedtime as needed for sleep.      Marland Kitchen aspirin 81 MG tablet Take 81 mg by mouth daily.       [name]        Marland Kitchen azelastine (ASTELIN) 137 MCG/SPRAY nasal spray Place 1 spray into the nose daily as needed for rhinitis.       . budesonide-formoterol (SYMBICORT) 160-4.5 MCG/ACT inhaler Inhale 2 puffs into the lungs 2 (two) times daily.        . Calcium Carbonate-Vitamin D (CALCIUM 600+D) 600-400 MG-UNIT per tablet Take 1 tablet by mouth 2 (two) times daily.      . cholecalciferol (VITAMIN D) 1000 UNITS tablet Take 1,000 Units by mouth daily.      Marland Kitchen loratadine (CLARITIN) 10 MG tablet Take 10 mg by mouth daily as needed for allergies.       . Multiple Vitamins-Minerals (ONE-A-DAY 50 PLUS PO) Take 1 tablet by mouth daily.         No current facility-administered medications for this visit.     Past Medical History  Diagnosis Date  . Hyperlipidemia   . Vitamin D deficiency   . Asthma   . Seasonal allergies   . OSA (obstructive sleep apnea) 02/16/2012     ROS:   All systems reviewed and negative except as noted in the HPI.   Past Surgical History  Procedure Laterality Date  . Partial hysterectomy       Family History  Problem Relation Age of Onset  . Cancer Mother   . Bladder Cancer Father   . Heart disease Father   . Allergies Mother      History   Social History  . Marital Status: Married    Spouse Name: N/A    Number of Children: N/A  . Years of Education: N/A   Occupational History  . retired    Social History Main Topics  . Smoking status: Never Smoker   . Smokeless tobacco: Not on file  . Alcohol Use: No  . Drug Use: No  . Sexual Activity: Not on file   Other Topics Concern  . Not on file   Social History Narrative  . No narrative on file     BP 152/81  Pulse 73  Ht 5\' 6"  (1.676 m)  Wt 174 lb 12.8 oz (79.289 kg)  BMI 28.23 kg/m2  Physical Exam:  Well appearing  67 year old woman, NAD HEENT: Unremarkable Neck:   7 cm JVD, no thyromegally Back:  No CVA tenderness Lungs:  Clear  With no wheezes, rales, or rhonchi. HEART:  Regular rate rhythm, no murmurs, no rubs, no clicks , split S2 Abd:  soft, positive bowel sounds, no organomegally, no rebound, no guarding Ext:  2 plus pulses, no edema, no cyanosis, no clubbing Skin:  No rashes no nodules Neuro:  CN II through XII intact, motor grossly intact  EKG -  Normal sinus rhythm with left bundle branch block, and left atrial enlargement   Assess/Plan:

## 2013-01-27 NOTE — Op Note (Signed)
Marcia Norris, Marcia Norris               ACCOUNT NO.:  [id number]  MEDICAL RECORD NO.:  [id number]  LOCATION:  [facility]                         FACILITY:  [facility]  PHYSICIAN:  Doylene Canning. Ladona Ridgel, MD    DATE OF BIRTH:  11-19-1945  DATE OF PROCEDURE:  01/27/2013 DATE OF DISCHARGE:                              OPERATIVE REPORT   PROCEDURE PERFORMED:  Electrophysiologic study and RF catheter ablation of a decremental and conducting posteroseptal accessory pathway causing AV reentrant tachycardia as well as catheter ablation of AV node reentry tachycardia, both without immediate procedure complication.  INTRODUCTION:  The patient is a 67 year old woman with a history of tachy palpitations, who was found on cardiac monitoring to have a wide QRS tachycardia.  Initially she had atrial flutter at rates of 150 to 60 beats per minute.  She is now referred for catheter ablation.  PROCEDURE:  After informed consent obtained, the patient was taken to the diagnostic EP lab in a fasting state.  After usual preparation and draping, intravenous fentanyl and midazolam was given for sedation.  A 6- [place] hexapolar catheter was inserted percutaneously in the right jugular vein and advanced to coronary sinus.  A 6-French quadripolar catheter was inserted percutaneously in the right femoral vein and advanced to right ventricle.  A 6-French quadripolar catheter was inserted percutaneously in the right femoral vein and advanced to His bundle region.  With catheter manipulation, the patient had nonsustained tachycardia.  Rapid ventricular pacing was then carried out from the right ventricle at a pacing cycle length of 600 milliseconds and stepwise decreased down to 350 milliseconds where VA Wenckebach was demonstrated.  During rapid ventricular pacing, the atrial activation sequence appeared to be midline and decremental.  Next, programmed ventricular stimulation was carried out from the right ventricle at base drive  cycle length of 161 milliseconds.  The S1-S2 interval was stepwise decreased from 540 milliseconds down to 320 milliseconds where the retrograde AV node ERP was observed.  During programmed ventricular stimulation, the atrial activation was midline and decremental.  Next, programmed atrial stimulation was carried out from the atrium at a base drive cycle length of 096 milliseconds.  The S1-S2 interval stepwise decreased down to 260 milliseconds resulting in atrial refractoriness. During programmed atrial stimulation, the patient experienced inducible SVT.  Rapid atrial pacing was then carried out from the atrium at a base drive cycle length of 045 milliseconds and stepwise decreased down to 340 milliseconds where AV Wenckebach was observed.  During rapid atrial pacing, the PR interval was less than the RR interval and there was again inducible SVT at a pacing cycle length of 370 milliseconds.  With induction of SVT, PVCs replaced at the time of His bundle refractoriness which demonstrated post excitation of the atrium.  In addition, ventricular pacing was carried out demonstrating a VAV conduction sequence.  With all of the above, the diagnosis of AV reentrant tachycardia utilizing a decrement conduction accessory pathway was made. A 7-French quadripolar ablation catheter was then inserted percutaneously through the right femoral vein and advanced into the right atrium.  Mapping of the posteroseptal space was carried out as well as the region around the tricuspid valve including the  lateral wall of the right atrium along the tricuspid valve.  The earliest atrial activation during tachycardia was confirmed and mapped to the posteroseptal space just slightly below the coronary sinus ostium.  RF energy was applied in this location.  A total of 5 RF energy applications were delivered.  Following catheter ablation, AV reentrant tachycardia could not be induced.  Additional programmed  atrial stimulation was carried out and at an S1-S2 coupling interval of 500/260, there was inducible SVT.  The cycle length of this SVT was 430 milliseconds and this was AV node reentrant tachycardia.  It was characterized by a very short VA time and PVCs being placed at the time of His bundle refractoriness not preexciting the atrium and a VAV conduction sequence during tachycardia.  The catheter was then maneuvered into the region around Koch's triangle.  A total of 2 RF energy applications were delivered between site 6 and 8 Koch's triangle resulting in accelerated junctional rhythm.  At this point, the patient was observed for 45 minutes.  During this time, rapid atrial pacing, rapid ventricular pacing, programed atrial stimulation, and programmed ventricular stimulation were all carried out.  There was no inducible SVT.  The catheters were removed, hemostasis was assured, and the patient was returned to her room in satisfactory condition.  COMPLICATIONS:  There were no immediate procedure complications.  RESULTS:  A.  Baseline ECG.  Baseline ECG demonstrates sinus rhythm with left bundle-branch block. B.  Baseline intervals.  Sinus node cycle length was 850 milliseconds. The HV interval was 64 milliseconds and the QRS duration was 140 milliseconds.  Following catheter ablation, there was no significant change in the intervals. C.  Rapid ventricular pacing.  Rapid ventricular pacing was carried out from the right ventricle at base cycle length of 600 milliseconds, stepwise decreased down to 350 milliseconds where VA Wenckebach was observed. D.  Programmed ventricular stimulation.  Programmed ventricular stimulation was carried out from the right ventricle at base drive cycle of length of 960 milliseconds.  The S1-S2 interval stepwise decreased down to 320 milliseconds where the retrograde AV node ERP was observed. During programmed ventricular stimulation, the atrial activation  was midline and decremental. E.  Rapid atrial pacing.  Rapid atrial pacing was carried out from the atrium at a base drive cycle length of 454 milliseconds and stepwise decreased down to 340 milliseconds where AV Wenckebach was observed. During rapid atrial pacing, the PR interval was not greater than the RR interval.  There was inducible SVT at a cycle length of 370 milliseconds. F.  Programmed atrial stimulation.  Programmed atrial stimulation was carried out from the atrium at a base drive cycle length of 098 and 500 milliseconds.  The S1-S2 interval was stepwise decreased down to 260 milliseconds, where the atrial refractoriness was observed.  During programmed atrial stimulation, there was inducible SVT prior to ablation. G.  Arrhythmias observed. 1. AV reentrant tachycardia initiation was with rapid atrial pacing     and spontaneous and with catheter manipulation the duration was     sustained.  Termination was with rapid ventricular pacing. 2. AV node reentrant tachycardia initiation was with programmed atrial     stimulation.  The duration was sustained.  Termination was with     ventricular pacing.     a.     Mapping.  Mapping of the accessory pathway demonstrated      earliest atrial activation at approximately 5 o'clock in the LAO      projection just on  the atrial side of the tricuspid valve annulus.      This coincided with the location just below the coronary sinus      ostium.  Mapping of the patient's AV node reentrant tachycardia      demonstrated successful site of catheter ablation at sites 6      through 8 in Koch's triangle.     b.     RF energy application.  A total of 7 RF energy applications      were delivered.  Five RF energy applications were applied to the      posteroseptal accessory pathway and 2 RF energy applications were      applied to the slow pathway region in Koch's triangle.  CONCLUSION:  This study demonstrates successful electrophysiologic  study of 2 specific SVTs, 1 AV reentrant tachycardia utilizing a concealed and decrementally conducting posteroseptal pathway and the other utilizing dual AV nodal physiology with both fast and slow pathway conduction in Koch's triangle.    Doylene Canning. Ladona Ridgel, MD    GWT/MEDQ  D:  01/27/2013  T:  01/27/2013  Job:  [id number]

## 2013-01-31 ENCOUNTER — Telehealth: Payer: Self-pay | Admitting: Cardiology

## 2013-01-31 NOTE — Telephone Encounter (Signed)
Advised patient that she should keep the appointment for carotid ultrasound on 12/11, that there are no contraindications from her ablation.  Patient verbalized understanding and agreement.

## 2013-01-31 NOTE — Telephone Encounter (Signed)
New message    Pt had ablation on 11-28,  Will she still need to come in for her carotid ultrasound?  It was scheduled before her albation.

## 2013-02-02 ENCOUNTER — Telehealth: Payer: Self-pay | Admitting: Internal Medicine

## 2013-02-02 NOTE — Telephone Encounter (Signed)
Patient states blood blister is size of dime, now a darker red/brown color, non-tender and not warm or inflammed. She states she does feel there is still fluid "in there" and just wants to know what to do about it.  Advised her that she should:  1) mark the edges to monitor for any size increase, 2) cover it with loose bandage to offer an extra layer of protection in case it "pops" open, 3) if it should "pop open" it should be washed well with water and covered with bandage and patient should call back for further instructions, and 4) patient advised on use of cold wrap for 1-2 minutes only if area becomes tender/painful. Patient verbalized understanding of instructions and stated she will call back should the blister "pop" open.

## 2013-02-02 NOTE — Telephone Encounter (Signed)
New message    Had ablation last Friday---now have a "blood blister" on rt side at incision site--size of a dime--not causing her any pain--want to make nurse aware---pls advise

## 2013-02-09 ENCOUNTER — Encounter: Payer: Self-pay | Admitting: Cardiovascular Disease

## 2013-02-09 ENCOUNTER — Ambulatory Visit (HOSPITAL_COMMUNITY): Payer: Medicare Other | Attending: Cardiology

## 2013-02-09 DIAGNOSIS — I771 Stricture of artery: Secondary | ICD-10-CM | POA: Insufficient documentation

## 2013-02-09 DIAGNOSIS — I6529 Occlusion and stenosis of unspecified carotid artery: Secondary | ICD-10-CM

## 2013-02-09 DIAGNOSIS — I10 Essential (primary) hypertension: Secondary | ICD-10-CM | POA: Insufficient documentation

## 2013-02-09 DIAGNOSIS — E785 Hyperlipidemia, unspecified: Secondary | ICD-10-CM | POA: Insufficient documentation

## 2013-02-09 DIAGNOSIS — I658 Occlusion and stenosis of other precerebral arteries: Secondary | ICD-10-CM | POA: Insufficient documentation

## 2013-02-09 DIAGNOSIS — R0989 Other specified symptoms and signs involving the circulatory and respiratory systems: Secondary | ICD-10-CM | POA: Insufficient documentation

## 2013-02-09 DIAGNOSIS — I6523 Occlusion and stenosis of bilateral carotid arteries: Secondary | ICD-10-CM

## 2013-02-28 ENCOUNTER — Encounter: Payer: Medicare Other | Admitting: Cardiology

## 2013-03-07 ENCOUNTER — Encounter: Payer: Self-pay | Admitting: Cardiology

## 2013-03-07 ENCOUNTER — Ambulatory Visit (INDEPENDENT_AMBULATORY_CARE_PROVIDER_SITE_OTHER): Payer: Medicare Other | Admitting: Cardiology

## 2013-03-07 VITALS — BP 140/78 | HR 72 | Ht 66.0 in | Wt 174.0 lb

## 2013-03-07 DIAGNOSIS — Z9889 Other specified postprocedural states: Secondary | ICD-10-CM

## 2013-03-07 DIAGNOSIS — Z8679 Personal history of other diseases of the circulatory system: Secondary | ICD-10-CM

## 2013-03-07 DIAGNOSIS — I471 Supraventricular tachycardia: Secondary | ICD-10-CM

## 2013-03-07 DIAGNOSIS — I498 Other specified cardiac arrhythmias: Secondary | ICD-10-CM

## 2013-03-07 NOTE — Progress Notes (Signed)
Patient ID: Marcia Norris MRN: [id number], DOB/AGE: 1945-09-17   Date of Visit: 03/07/2013  Primary Physician: Chesley Noon, MD Primary Cardiologist: Aundra Dubin, MD Primary EP: Lovena Le, MD Reason for Visit: Hospital follow-up  History of Present Illness  Marcia Norris is a 68 y.o. female with recently diagnosed WCT who underwent EPS +RF ablation of both AVRT and AVNRT in November 2014. She presents today for routine electrophysiology followup.   Since her ablation procedure she reports she is doing well and has no complaints. She has not had any recurrent symptoms. She denies chest pain or shortness of breath. She denies palpitations, dizziness, near syncope or syncope. She denies LE swelling, orthopnea or PND.   Past Medical History Past Medical History  Diagnosis Date  . Hyperlipidemia   . Vitamin D deficiency   . Asthma   . Seasonal allergies   . Atrial flutter   . Exertional shortness of breath   . OSA on CPAP 02/16/2012    Past Surgical History Past Surgical History  Procedure Laterality Date  . Atrial flutter ablation  01/27/2013  . Abdominal hysterectomy  1990's    partial  . Transurethral resection of bladder tumor  ~ 2004  . Cystoscopy      "multiple after bladder tumor removed; released in 2013" (01/27/2013)  . Cataract extraction w/ intraocular lens  implant, bilateral Bilateral 10-12/2012    Allergies/Intolerances Allergies  Allergen Reactions  . Codeine Nausea Only    Current Home Medications Current Outpatient Prescriptions  Medication Sig Dispense Refill  . ALPRAZolam (XANAX) 0.5 MG tablet Take 0.25-0.5 mg by mouth at bedtime as needed for sleep.      Marland Kitchen aspirin 81 MG tablet Take 81 mg by mouth daily.       [name]        Marland Kitchen azelastine (ASTELIN) 137 MCG/SPRAY nasal spray Place 1 spray into the nose daily as needed for rhinitis.       . budesonide-formoterol (SYMBICORT) 160-4.5 MCG/ACT inhaler  Inhale 2 puffs into the lungs 2 (two) times daily.        . Calcium Carbonate-Vitamin D (CALCIUM 600+D) 600-400 MG-UNIT per tablet Take 2 tablets by mouth 2 (two) times daily.       . cholecalciferol (VITAMIN D) 1000 UNITS tablet Take 1,000 Units by mouth daily.      Marland Kitchen loratadine (CLARITIN) 10 MG tablet Take 10 mg by mouth daily as needed for allergies.       . Multiple Vitamins-Minerals (ONE-A-DAY 50 PLUS PO) Take 1 tablet by mouth daily.         No current facility-administered medications for this visit.    Social History History   Social History  . Marital Status: Married    Spouse Name: N/A    Number of Children: N/A  . Years of Education: N/A   Occupational History  . retired    Social History Main Topics  . Smoking status: Never Smoker   . Smokeless tobacco: Never Used  . Alcohol Use: Yes     Comment: 01/27/2013 "glass of wine ~ 3 wk or so"  . Drug Use: No  . Sexual Activity: Yes   Other Topics Concern  . Not on file   Social History Narrative  . No narrative on file     Review of Systems General: No chills, fever, night sweats or weight changes Cardiovascular: No chest pain, dyspnea on exertion, edema, orthopnea, palpitations,  paroxysmal nocturnal dyspnea Dermatological: No rash, lesions or masses Respiratory: No cough, dyspnea Urologic: No hematuria, dysuria Abdominal: No nausea, vomiting, diarrhea, bright red blood per rectum, melena, or hematemesis Neurologic: No visual changes, weakness, changes in mental status All other systems reviewed and are otherwise negative except as noted above.  Physical Exam Vitals: Blood pressure 140/78, pulse 72, height 5\' 6"  (1.676 m), weight 174 lb (78.926 kg).  General: Well developed, well appearing 68 y.o. female in no acute distress. HEENT: Normocephalic, atraumatic. EOMs intact. Sclera nonicteric. Oropharynx clear.  Neck: Supple. No JVD. Lungs: Respirations regular and unlabored, CTA bilaterally. No wheezes, rales or  rhonchi. Heart: RRR. S1, S2 present. No murmurs, rub, S3 or S4. Abdomen: Soft, non-distended.  Extremities: No clubbing, cyanosis or edema. PT/Radials 2+ and equal bilaterally. Psych: Normal affect. Neuro: Alert and oriented X 3. Moves all extremities spontaneously.   Diagnostics EP Study with RF Catheter Ablation for AV nodal re-entrant tachycardia 11.28.2014  CONCLUSION: This study demonstrates successful electrophysiologic study  of 2 specific SVTs, 1 AV reentrant tachycardia utilizing a concealed and  decrementally conducting posteroseptal pathway and the other utilizing  dual AV nodal physiology with both fast and slow pathway conduction in  Koch's triangle. RF energy application - A total of 7 RF energy applications  were delivered. Five RF energy applications were applied to the  posteroseptal accessory pathway and 2 RF energy applications were  applied to the slow pathway region in Koch's triangle.  Assessment and Plan 1. PSVT - 2 specific SVTs found by EPS - s/p successful ablation for both AVRT and AVNRT - doing well post ablation without recurrent symptoms - keep scheduled follow-up with primary cardiologist, Dr. Aundra Dubin - follow-up with Dr. Lovena Le as needed  Signed, Ileene Hutchinson, PA-C 03/07/2013, 9:44 AM

## 2013-03-07 NOTE — Patient Instructions (Signed)
Your physician recommends that you schedule a follow-up appointment as need with Dr. Lovena Le  Keep upcoming appt with DR. Gordon  Your physician recommends that you continue on your current medications as directed. Please refer to the Current Medication list given to you today.

## 2013-03-17 ENCOUNTER — Emergency Department (HOSPITAL_BASED_OUTPATIENT_CLINIC_OR_DEPARTMENT_OTHER): Payer: Medicare Other

## 2013-03-17 ENCOUNTER — Emergency Department (HOSPITAL_BASED_OUTPATIENT_CLINIC_OR_DEPARTMENT_OTHER)
Admission: EM | Admit: 2013-03-17 | Discharge: 2013-03-17 | Disposition: A | Discharge status: Home / Self Care | Payer: Medicare Other | Attending: Emergency Medicine | Admitting: Emergency Medicine

## 2013-03-17 ENCOUNTER — Encounter (HOSPITAL_BASED_OUTPATIENT_CLINIC_OR_DEPARTMENT_OTHER): Payer: Self-pay | Admitting: Emergency Medicine

## 2013-03-17 DIAGNOSIS — R509 Fever, unspecified: Secondary | ICD-10-CM | POA: Insufficient documentation

## 2013-03-17 DIAGNOSIS — Z8679 Personal history of other diseases of the circulatory system: Secondary | ICD-10-CM | POA: Insufficient documentation

## 2013-03-17 DIAGNOSIS — E785 Hyperlipidemia, unspecified: Secondary | ICD-10-CM | POA: Insufficient documentation

## 2013-03-17 DIAGNOSIS — R1031 Right lower quadrant pain: Secondary | ICD-10-CM | POA: Insufficient documentation

## 2013-03-17 DIAGNOSIS — R030 Elevated blood-pressure reading, without diagnosis of hypertension: Secondary | ICD-10-CM | POA: Insufficient documentation

## 2013-03-17 DIAGNOSIS — K5900 Constipation, unspecified: Secondary | ICD-10-CM | POA: Insufficient documentation

## 2013-03-17 DIAGNOSIS — J45909 Unspecified asthma, uncomplicated: Secondary | ICD-10-CM | POA: Insufficient documentation

## 2013-03-17 DIAGNOSIS — G4733 Obstructive sleep apnea (adult) (pediatric): Secondary | ICD-10-CM | POA: Insufficient documentation

## 2013-03-17 DIAGNOSIS — E559 Vitamin D deficiency, unspecified: Secondary | ICD-10-CM | POA: Insufficient documentation

## 2013-03-17 DIAGNOSIS — R110 Nausea: Secondary | ICD-10-CM | POA: Insufficient documentation

## 2013-03-17 DIAGNOSIS — Z79899 Other long term (current) drug therapy: Secondary | ICD-10-CM | POA: Insufficient documentation

## 2013-03-17 DIAGNOSIS — Z7982 Long term (current) use of aspirin: Secondary | ICD-10-CM | POA: Insufficient documentation

## 2013-03-17 LAB — COMPREHENSIVE METABOLIC PANEL
ALT: 52 U/L — ABNORMAL HIGH (ref 0–35)
AST: 27 U/L (ref 0–37)
Albumin: 4 g/dL (ref 3.5–5.2)
Alkaline Phosphatase: 93 U/L (ref 39–117)
BILIRUBIN TOTAL: 0.5 mg/dL (ref 0.3–1.2)
BUN: 15 mg/dL (ref 6–23)
CHLORIDE: 103 meq/L (ref 96–112)
CO2: 28 meq/L (ref 19–32)
Calcium: 9.1 mg/dL (ref 8.4–10.5)
Creatinine, Ser: 0.7 mg/dL (ref 0.50–1.10)
GFR calc Af Amer: >90 mL/min (ref >90)
GFR, EST NON AFRICAN AMERICAN: 86 mL/min — AB (ref >90)
Glucose, Bld: 129 mg/dL — ABNORMAL HIGH (ref 70–99)
POTASSIUM: 4.1 meq/L (ref 3.7–5.3)
Sodium: 143 mEq/L (ref 137–147)
Total Protein: 7.2 g/dL (ref 6.0–8.3)

## 2013-03-17 LAB — URINALYSIS W MICROSCOPIC + REFLEX CULTURE
BILIRUBIN URINE: NEGATIVE
Glucose, UA: NEGATIVE mg/dL
KETONES UR: NEGATIVE mg/dL
NITRITE: NEGATIVE
PH: 7.5 (ref 5.0–8.0)
PROTEIN: NEGATIVE mg/dL
Specific Gravity, Urine: 1.005 (ref 1.005–1.030)
Urobilinogen, UA: 0.2 mg/dL (ref 0.0–1.0)

## 2013-03-17 LAB — CBC WITH DIFFERENTIAL/PLATELET
Basophils Absolute: 0 10*3/uL (ref 0.0–0.1)
Basophils Relative: 0 % (ref 0–1)
Eosinophils Absolute: 0.1 10*3/uL (ref 0.0–0.7)
Eosinophils Relative: 2 % (ref 0–5)
HEMATOCRIT: 41.8 % (ref 36.0–46.0)
HEMOGLOBIN: 14.1 g/dL (ref 12.0–15.0)
LYMPHS ABS: 1.9 10*3/uL (ref 0.7–4.0)
LYMPHS PCT: 39 % (ref 12–46)
MCH: 31.8 pg (ref 26.0–34.0)
MCHC: 33.7 g/dL (ref 30.0–36.0)
MCV: 94.4 fL (ref 78.0–100.0)
MONO ABS: 0.4 10*3/uL (ref 0.1–1.0)
MONOS PCT: 9 % (ref 3–12)
NEUTROS PCT: 49 % (ref 43–77)
Neutro Abs: 2.4 10*3/uL (ref 1.7–7.7)
Platelets: 212 10*3/uL (ref 150–400)
RBC: 4.43 MIL/uL (ref 3.87–5.11)
RDW: 12.7 % (ref 11.5–15.5)
WBC: 4.9 10*3/uL (ref 4.0–10.5)

## 2013-03-17 LAB — LIPASE, BLOOD: LIPASE: 23 U/L (ref 11–59)

## 2013-03-17 MED ORDER — IOHEXOL 300 MG/ML  SOLN
50.0000 mL | Freq: Once | INTRAMUSCULAR | Status: AC | PRN
  Administered 2013-03-17: 50 mL via ORAL

## 2013-03-17 MED ORDER — ONDANSETRON HCL 4 MG/2ML IJ SOLN
4.0000 mg | Freq: Once | INTRAMUSCULAR | Status: AC
  Administered 2013-03-17: 4 mg via INTRAVENOUS
  Filled 2013-03-17: qty 2

## 2013-03-17 MED ORDER — OXYCODONE-ACETAMINOPHEN 5-325 MG PO TABS: 1.0000 | ORAL_TABLET | Freq: Four times a day (QID) | ORAL | Status: DC | PRN

## 2013-03-17 MED ORDER — MORPHINE SULFATE 4 MG/ML IJ SOLN
4.0000 mg | Freq: Once | INTRAMUSCULAR | Status: AC
  Administered 2013-03-17: 4 mg via INTRAVENOUS
  Filled 2013-03-17: qty 1

## 2013-03-17 MED ORDER — ONDANSETRON 4 MG PO TBDP: ORAL_TABLET | ORAL | Status: DC

## 2013-03-17 MED ORDER — SODIUM CHLORIDE 0.9 % IV BOLUS (SEPSIS)
500.0000 mL | INTRAVENOUS | Status: AC
  Administered 2013-03-17: 500 mL via INTRAVENOUS

## 2013-03-17 MED ORDER — IOHEXOL 300 MG/ML  SOLN
100.0000 mL | Freq: Once | INTRAMUSCULAR | Status: AC | PRN
  Administered 2013-03-17: 100 mL via INTRAVENOUS

## 2013-03-17 NOTE — ED Notes (Signed)
Patient transported to CT 

## 2013-03-17 NOTE — ED Provider Notes (Signed)
CSN: [id number]     Arrival date & time 03/17/13  0818 History   First MD Initiated Contact with Patient 03/17/13 [phone]     Chief Complaint  Patient presents with  . Abdominal Pain    RLQ   (Consider location/radiation/quality/duration/timing/severity/associated sxs/prior Treatment) Patient is a 68 y.o. female presenting with abdominal pain. The history is provided by the patient.  Abdominal Pain Pain location:  RLQ Pain quality: throbbing   Pain radiates to:  Does not radiate Pain severity:  Mild Onset quality:  Sudden Duration:  1 day Timing:  Constant Progression:  Worsening Chronicity:  New Context comment:  At rest Relieved by:  Nothing Worsened by:  Nothing tried Ineffective treatments: ibuprofen. Associated symptoms: no chest pain, no cough, no diarrhea, no dysuria, no fatigue, no fever, no hematuria, no nausea, no shortness of breath and no vomiting     Past Medical History  Diagnosis Date  . Hyperlipidemia   . Vitamin D deficiency   . Asthma   . Seasonal allergies   . Atrial flutter   . Exertional shortness of breath   . OSA on CPAP 02/16/2012   Past Surgical History  Procedure Laterality Date  . Atrial flutter ablation  01/27/2013  . Abdominal hysterectomy  1990's    partial  . Transurethral resection of bladder tumor  ~ 2004  . Cystoscopy      "multiple after bladder tumor removed; released in 2013" (01/27/2013)  . Cataract extraction w/ intraocular lens  implant, bilateral Bilateral 10-12/2012   Family History  Problem Relation Age of Onset  . Cancer Mother   . Bladder Cancer Father   . Heart disease Father   . Allergies Mother    History  Substance Use Topics  . Smoking status: Never Smoker   . Smokeless tobacco: Never Used  . Alcohol Use: Yes     Comment: 01/27/2013 "glass of wine ~ 3 wk or so"   OB History   Grav Para Term Preterm Abortions TAB SAB Ect Mult Living                 Review of Systems  Constitutional: Negative for fever and  fatigue.  HENT: Negative for congestion and drooling.   Eyes: Negative for pain.  Respiratory: Negative for cough and shortness of breath.   Cardiovascular: Negative for chest pain.  Gastrointestinal: Positive for abdominal pain. Negative for nausea, vomiting and diarrhea.  Genitourinary: Negative for dysuria and hematuria.  Musculoskeletal: Negative for back pain, gait problem and neck pain.  Skin: Negative for color change.  Neurological: Negative for dizziness and headaches.  Hematological: Negative for adenopathy.  Psychiatric/Behavioral: Negative for behavioral problems.  All other systems reviewed and are negative.    Allergies  Codeine  Home Medications   Current Outpatient Rx  Name  Route  Sig  Dispense  Refill  . ALPRAZolam (XANAX) 0.5 MG tablet   Oral   Take 0.25-0.5 mg by mouth at bedtime as needed for sleep.         Marland Kitchen aspirin 81 MG tablet   Oral   Take 81 mg by mouth daily.          Marland Kitchen atorvastatin (LIPITOR) 40 MG tablet   Oral   Take 40 mg by mouth daily.           Marland Kitchen azelastine (ASTELIN) 137 MCG/SPRAY nasal spray   Nasal   Place 1 spray into the nose daily as needed for rhinitis.          Marland Kitchen  budesonide-formoterol (SYMBICORT) 160-4.5 MCG/ACT inhaler   Inhalation   Inhale 2 puffs into the lungs 2 (two) times daily.           . Calcium Carbonate-Vitamin D (CALCIUM 600+D) 600-400 MG-UNIT per tablet   Oral   Take 2 tablets by mouth 2 (two) times daily.          . cholecalciferol (VITAMIN D) 1000 UNITS tablet   Oral   Take 1,000 Units by mouth daily.         Marland Kitchen loratadine (CLARITIN) 10 MG tablet   Oral   Take 10 mg by mouth daily as needed for allergies.          . Multiple Vitamins-Minerals (ONE-A-DAY 50 PLUS PO)   Oral   Take 1 tablet by mouth daily.            BP 197/80  Pulse 63  Temp(Src) 98.4 F (36.9 C) (Oral)  Resp 18  Ht 5\' 6"  (1.676 m)  Wt 172 lb (78.019 kg)  BMI 27.77 kg/m2  SpO2 98% Physical Exam  Nursing note and  vitals reviewed. Constitutional: She is oriented to person, place, and time. She appears well-developed and well-nourished.  HENT:  Head: Normocephalic.  Mouth/Throat: Oropharynx is clear and moist. No oropharyngeal exudate.  Eyes: Conjunctivae and EOM are normal. Pupils are equal, round, and reactive to light.  Neck: Normal range of motion. Neck supple.  Cardiovascular: Normal rate, regular rhythm, normal heart sounds and intact distal pulses.  Exam reveals no gallop and no friction rub.   No murmur heard. Pulmonary/Chest: Effort normal and breath sounds normal. No respiratory distress. She has no wheezes.  Abdominal: Soft. Bowel sounds are normal. There is tenderness (mild tenderness to palpation localized to the right lower quadrant.). There is no rebound and no guarding.  Musculoskeletal: Normal range of motion. She exhibits no edema and no tenderness.  Neurological: She is alert and oriented to person, place, and time.  Skin: Skin is warm and dry.  Psychiatric: She has a normal mood and affect. Her behavior is normal.    ED Course  Procedures (including critical care time) Labs Review Labs Reviewed  COMPREHENSIVE METABOLIC PANEL - Abnormal; Notable for the following:    Glucose, Bld 129 (*)    ALT 52 (*)    GFR calc non Af Amer 86 (*)    All other components within normal limits  URINALYSIS W MICROSCOPIC + REFLEX CULTURE - Abnormal; Notable for the following:    Hgb urine dipstick TRACE (*)    Leukocytes, UA TRACE (*)    All other components within normal limits  CBC WITH DIFFERENTIAL  LIPASE, BLOOD   Imaging Review Ct Abdomen Pelvis W Contrast  03/17/2013   CLINICAL DATA:  Right lower quadrant pain  EXAM: CT ABDOMEN AND PELVIS WITH CONTRAST  TECHNIQUE: Multidetector CT imaging of the abdomen and pelvis was performed using the standard protocol following bolus administration of intravenous contrast.  CONTRAST:  25mL OMNIPAQUE IOHEXOL 300 MG/ML SOLN, 173mL OMNIPAQUE IOHEXOL 300  MG/ML SOLN  COMPARISON:  None.  FINDINGS: Mild hypoventilation is appreciated within the lung bases which otherwise unremarkable.  There is no CT evidence of bowel obstruction, enteritis, colitis, diverticulitis, nor appendicitis. The appendix is identified is unremarkable image 27 series 5. There is no evidence of abdominal or pelvic free fluid, loculated fluid collections, masses, or adenopathy. A moderate amount of fecal retention is appreciated within the colon.  There is no evidence of abdominal aortic aneurysm.  Celiac, SMA, IMA, portal vein, SMV are opacified.  There is diastases of the rectus abdominis sheath without overt abdominal wall hernia. There is no evidence of a inguinal hernia.  Degenerative disc disease changes appreciated within the visualized thoracic and lower lumbar spine. There is no evidence of aggressive appearing osseous lesions.  IMPRESSION: 1. There is no evidence of obstructive or inflammatory abnormalities. Specifically there is no CT evidence of appendicitis 2. Benign cyst within the right lobe of the liver   Electronically Signed   By: Margaree Mackintosh M.D.   On: 03/17/2013 10:00    EKG Interpretation   None       MDM   1. RLQ abdominal pain    8:43 AM 68 y.o. female who presents with right lower quadrant pain which began yesterday evening. The patient notes the pain has continued and is worse with ambulation. She denies any fevers, vomiting, or diarrhea. She has had mild nausea notes that she has intermittent constipation. She is afebrile and hypertensive here. Will give morphine for pain, screening labwork, CT scan of abdomen.  10:50 AM: I interpreted/reviewed the labs and/or imaging which were non-contributory. Unknown cause of pt's pain, possibly constipation vs msk cause. Pt denies injury.  I have discussed the diagnosis/risks/treatment options with the patient and believe the pt to be eligible for discharge home to follow-up with her pcp in 1-2 days. We also  discussed returning to the ED immediately if new or worsening sx occur. We discussed the sx which are most concerning (e.g., worsening abd pain, fever, new sx such as vomiting, diarrhea) that necessitate immediate return. Any new prescriptions provided to the patient are listed below.  Discharge Medication List as of 03/17/2013 10:51 AM    START taking these medications   Details  ondansetron (ZOFRAN ODT) 4 MG disintegrating tablet 4mg  ODT q4 hours prn nausea/vomit, Print    oxyCODONE-acetaminophen (PERCOCET) 5-325 MG per tablet Take 1 tablet by mouth every 6 (six) hours as needed for moderate pain., Starting 03/17/2013, Until Discontinued, Print         Blanchard Kelch, MD 03/18/13 [id number]

## 2013-03-17 NOTE — Discharge Instructions (Signed)
Abdominal Pain, Adult  Many things can cause belly (abdominal) pain. Most times, the belly pain is not dangerous. Many cases of belly pain can be watched and treated at home.  HOME CARE   · Do not take medicines that help you go poop (laxatives) unless told to by your doctor.  · Only take medicine as told by your doctor.  · Eat or drink as told by your doctor. Your doctor will tell you if you should be on a special diet.  GET HELP IF:  · You do not know what is causing your belly pain.  · You have belly pain while you are sick to your stomach (nauseous) or have runny poop (diarrhea).  · You have pain while you pee or poop.  · Your belly pain wakes you up at night.  · You have belly pain that gets worse or better when you eat.  · You have belly pain that gets worse when you eat fatty foods.  GET HELP RIGHT AWAY IF:   · The pain does not go away within 2 hours.  · You have a fever.  · You keep throwing up (vomiting).  · The pain changes and is only in the right or left part of the belly.  · You have bloody or tarry looking poop.  MAKE SURE YOU:   · Understand these instructions.  · Will watch your condition.  · Will get help right away if you are not doing well or get worse.  Document Released: 08/05/2007 Document Revised: 12/07/2012 Document Reviewed: 10/26/2012  [employer]® Patient Information ©2014 [employer].

## 2013-03-17 NOTE — ED Notes (Signed)
Patient states she developed sudden RLQ abdominal pain last night at 1730.  Pain is worse with ambulation. Pain decreases when resting. Pain is consistent and throbbing.  Pt feels that the pain is close to the area where she had a cardiac cath in Nov, 2015

## 2013-03-18 ENCOUNTER — Telehealth: Payer: Self-pay | Admitting: Adult Health

## 2013-03-18 NOTE — Telephone Encounter (Signed)
Patient called with complaints of right leg pain. She had RF ablation completed on 01/27/2013. Was worried this was related to blood clot. She was seen in ER on 03/17/2012 for right lower quadrant pain. Was seen in ER 03/17/2013 and ruled out for appendicitis.  She states the pain has now moved into her right leg. She has no swelling, numbness or tingling. She can bear wt.   I doubt that she is in danger of DVT at this time at this has been over 6 weeks. However,she is to watch these symptoms. If they persist or get worse. She is to call us back.

## 2013-03-22 ENCOUNTER — Telehealth: Payer: Self-pay | Admitting: Internal Medicine

## 2013-03-22 NOTE — Telephone Encounter (Signed)
Started last Thurs night with pain in right side that she thought it was appendicitis but now the pain is moving into the leg.  On Mon went to PCP and was sent for xray of hip and doppler of her leg to make sure there was no blood clot.  The pain is constant when she is moving but has gotten some better.  Now she has a numbness and tingling in her right leg.  She has taken some Tylenol and Aleve for the pain.  She has arthritis in her back and it could be a back issue.  I have discussed with Dr Lovena Le and he feels it may be do to her back.  She will look into this and call me back if needed

## 2013-03-22 NOTE — Telephone Encounter (Signed)
New message          C/o pain in groin area, pain in the left leg, numbness and tingling. Pt wants to know if this is associated with the incision that Dr Lovena Le done in Nov. Pt has been to pcp.

## 2013-04-14 DIAGNOSIS — M5126 Other intervertebral disc displacement, lumbar region: Secondary | ICD-10-CM | POA: Insufficient documentation

## 2013-04-30 HISTORY — PX: LUMBAR DISC SURGERY: SHX700

## 2013-04-30 HISTORY — PX: BACK SURGERY: SHX140

## 2013-05-29 ENCOUNTER — Encounter: Payer: Self-pay | Admitting: Gastroenterology

## 2013-07-03 ENCOUNTER — Telehealth: Payer: Self-pay | Admitting: Cardiology

## 2013-07-03 NOTE — Telephone Encounter (Signed)
appt made with Versie Starks 07/21/13 (Dr Sheila Oats Office). LMTCB for pt.

## 2013-07-03 NOTE — Telephone Encounter (Signed)
Pt states her feet and ankles have been swelling for the last 1-2 weeks, they are "OK" in the AM and  get worse by the end of the day. She is able to get her shoes on.  Pt denies increase in SOB, denies SOB at night, she does not weigh regularly but feels her weight may be up because she feels puffy in general. Pt feels her heart rate and BP are OK. Discussed avoiding NA and keeping feet and legs elevated during the day.  Pt has an appt with her PCP on Wed 07/05/13 and will discuss with her.  I will forward to Dr Aundra Dubin for review.

## 2013-07-03 NOTE — Telephone Encounter (Signed)
Would get her in for appt soon.

## 2013-07-03 NOTE — Telephone Encounter (Signed)
New message         Pt is having swelling in her feet. Is this normal?

## 2013-07-03 NOTE — Telephone Encounter (Signed)
Pt advised, she was fine with this plan.

## 2013-07-20 ENCOUNTER — Encounter: Payer: Medicare Other | Admitting: Gastroenterology

## 2013-07-21 ENCOUNTER — Telehealth: Payer: Self-pay | Admitting: *Deleted

## 2013-07-21 ENCOUNTER — Ambulatory Visit (INDEPENDENT_AMBULATORY_CARE_PROVIDER_SITE_OTHER): Payer: Medicare Other | Admitting: Physician Assistant

## 2013-07-21 ENCOUNTER — Encounter: Payer: Self-pay | Admitting: Physician Assistant

## 2013-07-21 VITALS — BP 150/70 | HR 81 | Ht 66.0 in | Wt 175.0 lb

## 2013-07-21 DIAGNOSIS — I119 Hypertensive heart disease without heart failure: Secondary | ICD-10-CM

## 2013-07-21 DIAGNOSIS — R609 Edema, unspecified: Secondary | ICD-10-CM

## 2013-07-21 DIAGNOSIS — R0602 Shortness of breath: Secondary | ICD-10-CM

## 2013-07-21 DIAGNOSIS — I447 Left bundle-branch block, unspecified: Secondary | ICD-10-CM

## 2013-07-21 DIAGNOSIS — E785 Hyperlipidemia, unspecified: Secondary | ICD-10-CM

## 2013-07-21 DIAGNOSIS — I471 Supraventricular tachycardia: Secondary | ICD-10-CM

## 2013-07-21 DIAGNOSIS — I6529 Occlusion and stenosis of unspecified carotid artery: Secondary | ICD-10-CM

## 2013-07-21 LAB — BASIC METABOLIC PANEL
BUN: 16 mg/dL (ref 6–23)
CHLORIDE: 101 meq/L (ref 96–112)
CO2: 32 mEq/L (ref 19–32)
Calcium: 9.5 mg/dL (ref 8.4–10.5)
Creatinine, Ser: 0.8 mg/dL (ref 0.4–1.2)
GFR: 81.16 mL/min (ref >60.00)
Glucose, Bld: 126 mg/dL — ABNORMAL HIGH (ref 70–99)
POTASSIUM: 3.4 meq/L — AB (ref 3.5–5.1)
Sodium: 140 mEq/L (ref 135–145)

## 2013-07-21 LAB — BRAIN NATRIURETIC PEPTIDE: PRO B NATRI PEPTIDE: 44 pg/mL (ref 0.0–100.0)

## 2013-07-21 MED ORDER — POTASSIUM CHLORIDE ER 20 MEQ PO TBCR: 20.0000 meq | EXTENDED_RELEASE_TABLET | Freq: Every day | ORAL | Status: DC | PRN

## 2013-07-21 MED ORDER — FUROSEMIDE 20 MG PO TABS: 20.0000 mg | ORAL_TABLET | ORAL | Status: DC | PRN

## 2013-07-21 MED ORDER — LOSARTAN POTASSIUM 25 MG PO TABS: 25.0000 mg | ORAL_TABLET | Freq: Every day | ORAL | Status: DC

## 2013-07-21 NOTE — Telephone Encounter (Signed)
pt notified about lab results and to start K+ with today's dose of 40 meq then go to 20 meq daily as needed only when taking the lasix.

## 2013-07-21 NOTE — Progress Notes (Addendum)
Cardiology Office Note   Date:  07/21/2013   ID:  Marcia Norris, DOB Jul 10, 1945, MRN [id number]  PCP:  Chesley Noon, MD  Cardiologist:  Dr. Loralie Champagne   Electrophysiologist:  Dr. Cristopher Peru    History of Present Illness: Marcia Norris is a 68 y.o. 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  Previously followed by Dr.  Bing Quarry.  Established with Dr. Loralie Champagne in 09/2012.  She was admitted with WCT in 11/2012 and subsequently underwent RFCA with Dr. Lovena Le.  Last seen by Dr. Loralie Champagne in 09/2012.  She called in recently with LE edema.  She did see her PCP and was given Lasix to take.  BP was elevated at 162/81.  Labs at PCP office:  ALT 50, K 4.3, Creatinine 0.68, TSH 4.620.  Edema is improved.  She denies any significant change in dyspnea.  She has asthma.  She denies exertional chest pain.  She denies orthopnea, PND.  No syncope.     Studies:  - Echo (09/2012):  Mild LVH, EF 55-65%, no RWMA, Gr 1 DD, mild MR.  - Nuclear (04/2012):  No ischemia, EF 62%, Normal  - Carotid US (01/2013):  Bilateral ICA 1-39%, mild R subclavian stenosis - f/u 1 year   Recent Labs: 12/21/2012: Pro B Natriuretic peptide (BNP) 298.8*  12/22/2012: TSH 3.595  03/17/2013: ALT 52*; Creatinine 0.70; Hemoglobin 14.1; Potassium 4.1   Wt Readings from Last 3 Encounters:  07/21/13 175 lb (79.379 kg)  03/17/13 172 lb (78.019 kg)  03/07/13 174 lb (78.926 kg)     Past Medical History  Diagnosis Date  . Hyperlipidemia   . Vitamin D deficiency   . Asthma   . Seasonal allergies   . Atrial flutter   . Exertional shortness of breath   . OSA on CPAP 02/16/2012    Current Outpatient Prescriptions  Medication Sig Dispense Refill  . ALPRAZolam (XANAX) 0.5 MG tablet Take 0.25-0.5 mg by mouth at bedtime as needed for sleep.      Marland Kitchen aspirin 81 MG tablet Take 81 mg by mouth daily.       Marland Kitchen atorvastatin (LIPITOR) 40 MG tablet Take 40 mg  by mouth daily.        Marland Kitchen azelastine (ASTELIN) 137 MCG/SPRAY nasal spray Place 1 spray into the nose daily as needed for rhinitis.       . budesonide-formoterol (SYMBICORT) 160-4.5 MCG/ACT inhaler Inhale 2 puffs into the lungs as needed.       . Calcium Carbonate-Vitamin D (CALCIUM 600+D) 600-400 MG-UNIT per tablet Take 2 tablets by mouth 2 (two) times daily.       . cholecalciferol (VITAMIN D) 1000 UNITS tablet Take 1,000 Units by mouth daily.      . furosemide (LASIX) 20 MG tablet Take 20 mg by mouth daily.      Marland Kitchen loratadine (CLARITIN) 10 MG tablet Take 10 mg by mouth daily as needed for allergies.       . Multiple Vitamins-Minerals (ONE-A-DAY 50 PLUS PO) Take 1 tablet by mouth daily.        Marland Kitchen albuterol (PROVENTIL HFA;VENTOLIN HFA) 108 (90 BASE) MCG/ACT inhaler Inhale 1 puff into the lungs every 6 (six) hours as needed for wheezing or shortness of breath.       No current facility-administered medications for this visit.    Allergies:   Codeine   Social History:  The patient  reports that she has never smoked. She has never used smokeless tobacco. She reports that she drinks alcohol. She reports that she does not use illicit drugs.   Family History:  The patient's family history includes Allergies in her mother; Bladder Cancer in her father; Cancer in her mother; Heart disease in her father.   ROS:  Please see the history of present illness.      All other systems reviewed and negative.   PHYSICAL EXAM: VS:  BP 150/70  Pulse 81  Ht 5\' 6"  (1.676 m)  Wt 175 lb (79.379 kg)  BMI 28.26 kg/m2 Well nourished, well developed, in no acute distress HEENT: normal Neck: no JVD Cardiac:  normal S1, S2; RRR; 1/6 systolic murmurat RUSB Lungs:  clear to auscultation bilaterally, no wheezing, rhonchi or rales Abd: soft, nontender, no hepatomegaly Ext: trace bilateral ankle edema Skin: warm and dry Neuro:  CNs 2-12 intact, no focal abnormalities noted  EKG:  NSR, HR 81, LBBB     ASSESSMENT AND  PLAN:  1. Hypertensive Heart Disease:  Recent BPs have been above target.  She has evidence of LVH and mild diastolic dysfunction on echo.  I doubt her recent edema was all 2/2 diastolic CHF. However, she is doing better on Lasix.  I will obtain a BMET, BNP today.  Start Losartan 25 mg QD for BP.  Check BMET in 1 week.  If BNP is significantly elevated, consider repeat Echo. 2. Edema:  I suspect this is mainly related to venous insufficiency.  I have recommended compression stockings and to limit her NaCl.  Check BNP as noted.  She may take the Lasix prn. 3. Paroxysmal supraventricular tachycardia:  No recurrence since RFCA.  F/u with GT as needed. 4. LBBB (left bundle branch block):  Prior Myoview low risk.   5. HYPERLIPIDEMIA:  Continue statin.  6. Carotid stenosis:  Mild by recent dopplers.  She will need repeat US in 01/2014.   7. Disposition:  F/u with Dr. Loralie Champagne in 08/2013 as planned.     Signed, Versie Starks, MHS 07/21/2013 12:14 PM    [facility] [address], [place], [place]  [address] Phone: [phone]; Fax: [phone]

## 2013-07-21 NOTE — Patient Instructions (Addendum)
Your physician has recommended you make the following change in your medication:   1. Only take lasix 20 mg as needed.   2. Start Losartan 25 mg 1 tablet daily.   Your physician recommends that you return for lab work today for BNP and BMEt and in 1 week for [facility] on 07/28/13.   Your physician recommends that you follow up as scheduled with Dr. Aundra Dubin in July.

## 2013-07-28 ENCOUNTER — Other Ambulatory Visit (INDEPENDENT_AMBULATORY_CARE_PROVIDER_SITE_OTHER): Payer: Medicare Other

## 2013-07-28 DIAGNOSIS — I119 Hypertensive heart disease without heart failure: Secondary | ICD-10-CM

## 2013-07-28 LAB — BASIC METABOLIC PANEL
BUN: 21 mg/dL (ref 6–23)
CHLORIDE: 103 meq/L (ref 96–112)
CO2: 32 mEq/L (ref 19–32)
Calcium: 9.3 mg/dL (ref 8.4–10.5)
Creatinine, Ser: 0.7 mg/dL (ref 0.4–1.2)
GFR: 82.42 mL/min (ref >60.00)
Glucose, Bld: 95 mg/dL (ref 70–99)
Potassium: 3.7 mEq/L (ref 3.5–5.1)
Sodium: 140 mEq/L (ref 135–145)

## 2013-08-01 ENCOUNTER — Telehealth: Payer: Self-pay | Admitting: *Deleted

## 2013-08-01 ENCOUNTER — Ambulatory Visit (AMBULATORY_SURGERY_CENTER): Payer: Self-pay

## 2013-08-01 VITALS — Ht 66.5 in | Wt 175.8 lb

## 2013-08-01 DIAGNOSIS — Z1211 Encounter for screening for malignant neoplasm of colon: Secondary | ICD-10-CM

## 2013-08-01 MED ORDER — NA SULFATE-K SULFATE-MG SULF 17.5-3.13-1.6 GM/177ML PO SOLN: ORAL | Status: DC

## 2013-08-01 NOTE — Telephone Encounter (Signed)
pt notified about normal lab results with verbal understanding 

## 2013-08-01 NOTE — Telephone Encounter (Signed)
lmptcb for lab results 

## 2013-08-01 NOTE — Progress Notes (Signed)
Per pt, no allergies to soy or egg products.Pt not taking any weight loss meds or using  O2 at home. 

## 2013-08-02 ENCOUNTER — Encounter: Payer: Self-pay | Admitting: Gastroenterology

## 2013-08-02 DIAGNOSIS — R609 Edema, unspecified: Secondary | ICD-10-CM | POA: Insufficient documentation

## 2013-08-15 ENCOUNTER — Encounter: Payer: Self-pay | Admitting: Gastroenterology

## 2013-08-15 ENCOUNTER — Ambulatory Visit (AMBULATORY_SURGERY_CENTER): Payer: Medicare Other | Admitting: Gastroenterology

## 2013-08-15 VITALS — BP 127/71 | HR 76 | Temp 98.0°F | Resp 17 | Ht 66.5 in | Wt 175.0 lb

## 2013-08-15 DIAGNOSIS — Z1211 Encounter for screening for malignant neoplasm of colon: Secondary | ICD-10-CM

## 2013-08-15 DIAGNOSIS — K5730 Diverticulosis of large intestine without perforation or abscess without bleeding: Secondary | ICD-10-CM

## 2013-08-15 MED ORDER — SODIUM CHLORIDE 0.9 % IV SOLN: 500.0000 mL | INTRAVENOUS | Status: DC

## 2013-08-15 NOTE — Progress Notes (Signed)
Report to PACU, RN, vss, BBS= Clear.  

## 2013-08-15 NOTE — Patient Instructions (Signed)
YOU HAD AN ENDOSCOPIC PROCEDURE TODAY AT THE [facility]: Refer to the procedure report that was given to you for any specific questions about what was found during the examination.  If the procedure report does not answer your questions, please call your gastroenterologist to clarify.  If you requested that your care partner not be given the details of your procedure findings, then the procedure report has been included in a sealed envelope for you to review at your convenience later.  YOU SHOULD EXPECT: Some feelings of bloating in the abdomen. Passage of more gas than usual.  Walking can help get rid of the air that was put into your GI tract during the procedure and reduce the bloating. If you had a lower endoscopy (such as a colonoscopy or flexible sigmoidoscopy) you may notice spotting of blood in your stool or on the toilet paper. If you underwent a bowel prep for your procedure, then you may not have a normal bowel movement for a few days.  DIET: Your first meal following the procedure should be a light meal and then it is ok to progress to your normal diet.  A half-sandwich or bowl of soup is an example of a good first meal.  Heavy or fried foods are harder to digest and may make you feel nauseous or bloated.  Likewise meals heavy in dairy and vegetables can cause extra gas to form and this can also increase the bloating.  Drink plenty of fluids but you should avoid alcoholic beverages for 24 hours.  ACTIVITY: Your care partner should take you home directly after the procedure.  You should plan to take it easy, moving slowly for the rest of the day.  You can resume normal activity the day after the procedure however you should NOT DRIVE or use heavy machinery for 24 hours (because of the sedation medicines used during the test).    SYMPTOMS TO REPORT IMMEDIATELY: A gastroenterologist can be reached at any hour.  During normal business hours, 8:30 AM to 5:00 PM Monday through Friday,  call [phone].  After hours and on weekends, please call the GI answering service at [phone] who will take a message and have the physician on call contact you.   Following lower endoscopy (colonoscopy or flexible sigmoidoscopy):  Excessive amounts of blood in the stool  Significant tenderness or worsening of abdominal pains  Swelling of the abdomen that is new, acute  Fever of 100F or higher  FOLLOW UP: If any biopsies were taken you will be contacted by phone or by letter within the next 1-3 weeks.  Call your gastroenterologist if you have not heard about the biopsies in 3 weeks.  Our staff will call the home number listed on your records the next business day following your procedure to check on you and address any questions or concerns that you may have at that time regarding the information given to you following your procedure. This is a courtesy call and so if there is no answer at the home number and we have not heard from you through the emergency physician on call, we will assume that you have returned to your regular daily activities without incident.  SIGNATURES/CONFIDENTIALITY: You and/or your care partner have signed paperwork which will be entered into your electronic medical record.  These signatures attest to the fact that that the information above on your After Visit Summary has been reviewed and is understood.  Full responsibility of the confidentiality of this   discharge information lies with you and/or your care-partner.  Recommendations Next colonoscopy in 10 years for "routine risk" patients.

## 2013-08-15 NOTE — Op Note (Signed)
[place]  [address] [place], [id number]   COLONOSCOPY PROCEDURE REPORT  PATIENT: Marcia Norris, Marcia Norris  MR#: [id number] BIRTHDATE: 1946-03-02 , 9  yrs. old GENDER: Female ENDOSCOPIST: Inda Castle, MD REFERRED BY: PROCEDURE DATE:  08/15/2013 PROCEDURE:   Colonoscopy, diagnostic First Screening Colonoscopy - Avg.  risk and is 50 yrs.  old or older - No.  Prior Negative Screening - Now for repeat screening. 10 or more years since last screening  History of Adenoma - Now for follow-up colonoscopy & has been > or = to 3 yrs.  N/A  Polyps Removed Today? No.  Recommend repeat exam, <10 yrs? No. ASA CLASS:   Class II INDICATIONS:Average risk patient for colon cancer. MEDICATIONS: MAC sedation, administered by CRNA and Propofol (Diprivan) 280 mg IV  DESCRIPTION OF PROCEDURE:   After the risks benefits and alternatives of the procedure were thoroughly explained, informed consent was obtained.  A digital rectal exam revealed no abnormalities of the rectum.   The LB [id number]  endoscope was introduced through the anus and advanced to the cecum, which was identified by both the appendix and ileocecal valve. No adverse events experienced.   The quality of the prep was Suprep good  The instrument was then slowly withdrawn as the colon was fully examined.      COLON FINDINGS: Mild diverticulosis was noted in the sigmoid colon. The colon was otherwise normal.  There was no diverticulosis, inflammation, polyps or cancers unless previously stated. Retroflexed views revealed no abnormalities. The time to cecum=5 minutes 39 seconds.  Withdrawal time=6 minutes 01 seconds.  The scope was withdrawn and the procedure completed. COMPLICATIONS: There were no complications.  ENDOSCOPIC IMPRESSION: 1.   Mild diverticulosis was noted in the sigmoid colon 2.   The colon was otherwise normal  RECOMMENDATIONS: Continue current colorectal screening recommendations for  "routine risk" patients with a repeat colonoscopy in 10 years.   eSigned:  Inda Castle, MD 08/15/2013 8:35 AM   cc: Anastasia Pall, MD

## 2013-08-16 ENCOUNTER — Telehealth: Payer: Self-pay

## 2013-08-16 NOTE — Telephone Encounter (Signed)
  Follow up Call-  Call back number 08/15/2013  Post procedure Call Back phone  # [phone]  Permission to leave phone message Yes     Patient questions:  Do you have a fever, pain , or abdominal swelling? no Pain Score  0 *  Have you tolerated food without any problems? yes  Have you been able to return to your normal activities? yes  Do you have any questions about your discharge instructions: Diet   no Medications  no Follow up visit  no  Do you have questions or concerns about your Care? no  Actions: * If pain score is 4 or above: No action needed, pain <4.   Follow up Call-  Call back number 08/15/2013  Post procedure Call Back phone  # [phone]  Permission to leave phone message Yes     Patient questions:  Do you have a fever, pain , or abdominal swelling? no Pain Score  0 *  Have you tolerated food without any problems? yes  Have you been able to return to your normal activities? yes  Do you have any questions about your discharge instructions: Diet   no Medications  no Follow up visit  no  Do you have questions or concerns about your Care? no  Actions: * If pain score is 4 or above: No action needed, pain <4.

## 2013-08-24 ENCOUNTER — Encounter: Payer: Self-pay | Admitting: *Deleted

## 2013-08-24 ENCOUNTER — Other Ambulatory Visit: Payer: Self-pay | Admitting: *Deleted

## 2013-08-31 ENCOUNTER — Encounter: Payer: Self-pay | Admitting: *Deleted

## 2013-08-31 ENCOUNTER — Encounter: Payer: Self-pay | Admitting: Cardiology

## 2013-08-31 ENCOUNTER — Ambulatory Visit (INDEPENDENT_AMBULATORY_CARE_PROVIDER_SITE_OTHER): Payer: Medicare Other | Admitting: Cardiology

## 2013-08-31 VITALS — BP 130/74 | HR 83 | Ht 66.5 in | Wt 178.0 lb

## 2013-08-31 DIAGNOSIS — I447 Left bundle-branch block, unspecified: Secondary | ICD-10-CM

## 2013-08-31 DIAGNOSIS — I8390 Asymptomatic varicose veins of unspecified lower extremity: Secondary | ICD-10-CM

## 2013-08-31 DIAGNOSIS — I872 Venous insufficiency (chronic) (peripheral): Secondary | ICD-10-CM

## 2013-08-31 DIAGNOSIS — I471 Supraventricular tachycardia: Secondary | ICD-10-CM

## 2013-08-31 DIAGNOSIS — I6529 Occlusion and stenosis of unspecified carotid artery: Secondary | ICD-10-CM

## 2013-08-31 LAB — BASIC METABOLIC PANEL
BUN: 16 mg/dL (ref 6–23)
CO2: 31 meq/L (ref 19–32)
CREATININE: 0.6 mg/dL (ref 0.4–1.2)
Calcium: 9.2 mg/dL (ref 8.4–10.5)
Chloride: 105 mEq/L (ref 96–112)
GFR: 99.21 mL/min (ref >60.00)
Glucose, Bld: 128 mg/dL — ABNORMAL HIGH (ref 70–99)
Potassium: 3.7 mEq/L (ref 3.5–5.1)
SODIUM: 141 meq/L (ref 135–145)

## 2013-08-31 NOTE — Progress Notes (Signed)
Patient ID: Marcia Norris, female   DOB: 02-Mar-1946, 68 y.o.   MRN: [id number] PCP: Dr. Melford Aase  68 yo with history of OSA, mild carotid stenosis, AVNRT/AVRT ablation, and chronic LBBB presents for cardiology followup.  Since I last saw her, she was admitted with SVT after albuterol use.  She ended up having AVNRT and AVRT ablation in 12/14.  She has been doing well since then.  No tachypalpitations.  No chest pain, no exertional dyspnea.  Her main complaint today is that she has painful varicose veins in the lower legs bilaterally.   Labs (5/15): K 3.7, creatinine 0.7   PMH: 1. OSA: On CPAP 2. Hyperlipidemia 3. Asthma 4. Chronic LBBB: adenosine Myoview in 2/14 showed EF 62%, no ischemia or infarction.  Echo (8/14) with EF 55-60%, mild MR.   5. Carotid stenosis: Carotid dopplers showed 0-39% RICA stenosis in 12/12. Carotids (12/14) with bilateral 1-39% ICA stenosis.  6. AVNRT and AVRT s/p RFCA in 12/14.  7. HTN 8. Varicose veins  SH: Married, nonsmoker, lives in [place], retired, 2 kids.   FH: Father with CVA, CHF, ESRD.  ROS: All systems reviewed and negative except as per HPI.    Current Outpatient Prescriptions  Medication Sig Dispense Refill  . ALPRAZolam (XANAX) 0.5 MG tablet Take 0.25-0.5 mg by mouth 3 (three) times a week.       Marland Kitchen aspirin 81 MG tablet Take 81 mg by mouth every morning.       [name]        Marland Kitchen azelastine (ASTELIN) 137 MCG/SPRAY nasal spray Place 1 spray into the nose daily as needed for rhinitis.       . budesonide-formoterol (SYMBICORT) 160-4.5 MCG/ACT inhaler Inhale 2 puffs into the lungs as needed.       . Calcium Carbonate-Vitamin D (CALCIUM 600+D) 600-400 MG-UNIT per tablet Take 2 tablets by mouth 2 (two) times daily.       . cholecalciferol (VITAMIN D) 1000 UNITS tablet Take 1,000 Units by mouth daily.      . furosemide (LASIX) 20 MG tablet Take 20 mg by mouth. 5 times a week      . loratadine  (CLARITIN) 10 MG tablet Take 10 mg by mouth daily as needed for allergies.       Marland Kitchen losartan (COZAAR) 25 MG tablet Take 25 mg by mouth every morning.      . Multiple Vitamins-Minerals (ONE-A-DAY 50 PLUS PO) Take 1 tablet by mouth daily.        . potassium chloride SA (KLOR-CON M20) 20 MEQ tablet Take 20 mEq by mouth. 5 times a week.       No current facility-administered medications for this visit.    BP 130/74  Pulse 83  Ht 5' 6.5" (1.689 m)  Wt 178 lb (80.74 kg)  BMI 28.30 kg/m2  SpO2 95% General: NAD Neck: No JVD, no thyromegaly or thyroid nodule.  Lungs: Clear to auscultation bilaterally with normal respiratory effort. CV: Nondisplaced PMI.  Heart regular S1/S2, no S3/S4, 1/6 early SEM RUSB.  Trace ankle edema.  Right carotid bruit.  Normal pedal pulses.  Abdomen: Soft, nontender, no hepatosplenomegaly, no distention.  Skin: Intact without lesions or rashes.  Neurologic: Alert and oriented x 3.  Psych: Normal affect. Extremities: No clubbing or cyanosis. Bilateral venous varicosities.   Assessment/Plan: 1. LBBB: Chronic.  No evidence for significant CAD by recent adenosine myoview or by symptoms. EF preserved  on echo in 8/14.  2. Carotid bruit: Mild carotid stenosis.  Repeat dopplers in 12/15.   3. Varicose veins: Painful.  I will refer her to [facility] for evaluation.  4. H/o AVNRT/AVRT: No symptomatic recurrence since ablation.    Loralie Champagne 08/31/2013

## 2013-08-31 NOTE — Patient Instructions (Signed)
Your physician recommends that you have lab work today--BMET.  Your physician has requested that you have a carotid duplex. This test is an ultrasound of the carotid arteries in your neck. It looks at blood flow through these arteries that supply the brain with blood. Allow one hour for this exam. There are no restrictions or special instructions. December 2015  You have been referred to Dr Trula Slade at [facility] for your varicose veins.  Your physician wants you to follow-up in: 1 year with Dr Aundra Dubin. (July 2016). You will receive a reminder letter in the mail two months in advance. If you don't receive a letter, please call our office to schedule the follow-up appointment.

## 2013-09-13 ENCOUNTER — Encounter: Payer: Self-pay | Admitting: Pulmonary Disease

## 2013-09-20 ENCOUNTER — Ambulatory Visit: Payer: Medicare Other | Admitting: Pulmonary Disease

## 2013-09-28 ENCOUNTER — Other Ambulatory Visit: Payer: Self-pay | Admitting: *Deleted

## 2013-09-28 DIAGNOSIS — I83893 Varicose veins of bilateral lower extremities with other complications: Secondary | ICD-10-CM

## 2013-10-02 ENCOUNTER — Encounter: Payer: Self-pay | Admitting: Cardiology

## 2013-10-13 ENCOUNTER — Encounter: Payer: Self-pay | Admitting: Surgery

## 2013-10-16 ENCOUNTER — Ambulatory Visit (HOSPITAL_COMMUNITY)
Admission: RE | Admit: 2013-10-16 | Discharge: 2013-10-16 | Disposition: A | Discharge status: Home / Self Care | Payer: Medicare Other | Source: Ambulatory Visit | Attending: Surgery | Admitting: Surgery

## 2013-10-16 ENCOUNTER — Ambulatory Visit (INDEPENDENT_AMBULATORY_CARE_PROVIDER_SITE_OTHER): Payer: Medicare Other | Admitting: Surgery

## 2013-10-16 ENCOUNTER — Encounter: Payer: Self-pay | Admitting: Surgery

## 2013-10-16 VITALS — BP 129/59 | HR 78 | Ht 66.5 in | Wt 174.9 lb

## 2013-10-16 DIAGNOSIS — I83893 Varicose veins of bilateral lower extremities with other complications: Secondary | ICD-10-CM | POA: Insufficient documentation

## 2013-10-16 NOTE — Progress Notes (Signed)
Patient name: Marcia Norris MRN: [id number] DOB: 04/26/1945 Sex: female   Referred by: Dr. Aundra Dubin  Reason for referral:  Chief Complaint  Patient presents with  . Varicose Veins    bilateral painful vv's    HISTORY OF PRESENT ILLNESS: This is a 68 year old female who comes in today for further evaluation of painful swollen legs.  The patient states that she has more problems with her left leg, particularly with swelling.  She did have a injury to her left leg about a year and a half ago.  She has taken Lasix which did help with edema.  She will occasionally get tingling in her feet.  The vein clusters in her legs are painful at times.  The patient has a history of atrial flutter which was treated with ablation.  She is medically managed for hypertension.  She is on nystatin for hypercholesterolemia.  Past Medical History  Diagnosis Date  . Hyperlipidemia   . Vitamin D deficiency   . Asthma   . Seasonal allergies   . Atrial flutter   . Exertional shortness of breath   . OSA on CPAP 02/16/2012  . Diverticulosis   . Varicose veins     Past Surgical History  Procedure Laterality Date  . Atrial flutter ablation  01/27/2013  . Abdominal hysterectomy  1990's    partial  . Transurethral resection of bladder tumor  ~ 2004  . Cystoscopy      "multiple after bladder tumor removed; released in 2013" (01/27/2013)  . Cataract extraction w/ intraocular lens  implant, bilateral Bilateral 10-12/2012  . Back surgery  04/2013    History   Social History  . Marital Status: Married    Spouse Name: N/A    Number of Children: N/A  . Years of Education: N/A   Occupational History  . retired    Social History Main Topics  . Smoking status: Never Smoker   . Smokeless tobacco: Never Used  . Alcohol Use: Yes     Comment: 01/27/2013 "glass of wine ~ 3 wk or so"  . Drug Use: No  . Sexual Activity: Yes   Other Topics Concern  . Not on file   Social History Narrative  . No  narrative on file    Family History  Problem Relation Age of Onset  . Cancer Mother   . Allergies Mother   . Varicose Veins Mother   . Bladder Cancer Father   . Heart disease Father   . Kidney disease Father   . Diabetes Father   . Hyperlipidemia Father   . Hypertension Father   . Diabetes Sister   . Colon cancer Neg Hx     Allergies as of 10/16/2013 - Review Complete 10/16/2013  Allergen Reaction Noted  . Codeine Nausea Only     Current Outpatient Prescriptions on File Prior to Visit  Medication Sig Dispense Refill  . ALPRAZolam (XANAX) 0.5 MG tablet Take 0.25-0.5 mg by mouth 3 (three) times a week.       Marland Kitchen aspirin 81 MG tablet Take 81 mg by mouth every morning.       [name]        Marland Kitchen azelastine (ASTELIN) 137 MCG/SPRAY nasal spray Place 1 spray into the nose daily as needed for rhinitis.       . budesonide-formoterol (SYMBICORT) 160-4.5 MCG/ACT inhaler Inhale 2 puffs into the lungs as needed.       Marland Kitchen  Calcium Carbonate-Vitamin D (CALCIUM 600+D) 600-400 MG-UNIT per tablet Take 2 tablets by mouth 2 (two) times daily.       . cholecalciferol (VITAMIN D) 1000 UNITS tablet Take 1,000 Units by mouth daily.      . furosemide (LASIX) 20 MG tablet Take 20 mg by mouth. 3 times per week      . loratadine (CLARITIN) 10 MG tablet Take 10 mg by mouth daily as needed for allergies.       Marland Kitchen losartan (COZAAR) 25 MG tablet Take 25 mg by mouth every morning.      . Multiple Vitamins-Minerals (ONE-A-DAY 50 PLUS PO) Take 1 tablet by mouth daily.        . potassium chloride SA (KLOR-CON M20) 20 MEQ tablet Take 20 mEq by mouth. 3 times a week.       No current facility-administered medications on file prior to visit.     REVIEW OF SYSTEMS: Cardiovascular: No chest pain.  Positive for pain in legs with walking as well as swelling in her legs and varicose veins. Pulmonary: No productive cough.  Positive for asthma Neurologic: No weakness,  paresthesias, aphasia, or amaurosis. No dizziness. Hematologic: No bleeding problems or clotting disorders. Musculoskeletal: No joint pain or joint swelling. Gastrointestinal: No blood in stool or hematemesis Genitourinary: No dysuria or hematuria. Psychiatric:: No history of major depression. Integumentary: No rashes or ulcers. Constitutional: No fever or chills.  PHYSICAL EXAMINATION: General: The patient appears their stated age.  Vital signs are BP 129/59  Pulse 78  Ht 5' 6.5" (1.689 m)  Wt 174 lb 14.4 oz (79.334 kg)  BMI 27.81 kg/m2  SpO2 94% HEENT:  No gross abnormalities Pulmonary: Respirations are non-labored Musculoskeletal: There are no major deformities.   Neurologic: No focal weakness or paresthesias are detected, Skin: There are no ulcer or rashes noted. Psychiatric: The patient has normal affect. Cardiovascular: There is a regular rate and rhythm without significant murmur appreciated.  Palpable pedal pulses bilaterally.  Trace edema bilaterally, left greater than right.  Mild discoloration in the left foot.  Multiple spider vein clusters in the anterior upper thigh  Diagnostic Studies: I have ordered and reviewed her venous reflux evaluation.  She has no evidence of DVT bilaterally.  There is right common femoral vein reflux.  There is no reflux in the deep system on the left. There are small short areas of reflux in the right great saphenous vein in one area in the left.  No short saphenous vein reflux bilaterally.   Assessment:  Venous insufficiency Plan: By ultrasound evaluation today, the patient has very mild reflux symptoms.  Therefore, I do not think that she would be a candidate for laser ablation.  I feel her swelling is best treated with compression stockings.  I have given her a prescription for 20-30 mm compression.  We discussed the long-term benefits of wearing compression, and the downside if she does not.  The patient does have several areas and  clusters of spider veins on her anterior thigh.  Treating these would be considered cosmetic which I told the patient.  She is interested in getting them evaluated.  I will have Thea Silversmith contact her in the immediate future.     Eldridge Abrahams, M.D. [facility] Office: [phone] Pager:  [phone]

## 2013-10-31 ENCOUNTER — Encounter: Payer: Self-pay | Admitting: Pulmonary Disease

## 2013-10-31 ENCOUNTER — Ambulatory Visit (INDEPENDENT_AMBULATORY_CARE_PROVIDER_SITE_OTHER): Payer: Medicare Other | Admitting: Pulmonary Disease

## 2013-10-31 VITALS — BP 152/70 | HR 86 | Ht 66.5 in | Wt 175.0 lb

## 2013-10-31 DIAGNOSIS — G4733 Obstructive sleep apnea (adult) (pediatric): Secondary | ICD-10-CM

## 2013-10-31 DIAGNOSIS — Z9989 Dependence on other enabling machines and devices: Principal | ICD-10-CM

## 2013-10-31 NOTE — Progress Notes (Signed)
Chief Complaint  Patient presents with  . Follow-up    Pt states that she is not wearing her CPAP--has not worn x 2 weeks. Pt states that she has not been able to tolerate--pulls mask off during the night.     History of Present Illness: Marcia Norris is a 68 y.o. female with OSA.  She has been trying to use CPAP.  She can fall asleep with machine, but then her mask comes off during the night.  She has a full face mask, and no issues with mask fit.  She snores more when her allergies are a problem > usually in fall.  She was recently treated for atrial flutter and hypertension.   TESTS: Echo 04/03/11 >> EF 60 to 07%, grade 1 diastolic dysfx, mild MR PSG 03/08/12 >> AHI 10.6, SpO2 low 82%, PLMI 1.8. Auto CPAP 05/15/12 to 06/13/12 >> Used on 29 of 30 nights with average 6 hrs 17 min. Average AHI 0.8 with median CPAP 7 cm H2O and 95th percentile CPAP 10 cm H2O.  PMHx, PSHx, Medications, Allergies, Fhx, Shx reviewed.  Physical Exam:  General - No distress ENT - No sinus tenderness, MP 4, enlarged tongue, low soft palate, no oral exudate, no LAN Cardiac - s1s2 regular, no murmur Chest - No wheeze/rales/dullness Back - No focal tenderness Abd - Soft, non-tender Ext - No edema Neuro - Normal strength Skin - No rashes Psych - normal mood, and behavior   Assessment/Plan:  Chesley Mires, MD [facility] Pager:  [phone] 10/31/2013, 9:43 AM

## 2013-10-31 NOTE — Patient Instructions (Signed)
Will change pressure setting for CPAP Will get CPAP report one month after pressure change Follow up in 4 months

## 2013-10-31 NOTE — Assessment & Plan Note (Signed)
She is having trouble with using CPAP.  She tries to maintain compliance, but mask comes off during the night.  I spent extensive time reviewing her sleep study, and how sleep apnea can affect her health (a flutter, HTN).  Also reviewed different treatment options for sleep apnea.  Will try changing her to auto CPAP range 5 to 15 cm H2O >> will get download 1 month after pressure change.  If this is unsuccessful, might then need to consider change to oral appliance.

## 2013-11-13 ENCOUNTER — Other Ambulatory Visit: Payer: Self-pay | Admitting: Dermatology

## 2013-11-16 ENCOUNTER — Encounter: Payer: Self-pay | Admitting: Gastroenterology

## 2013-12-08 ENCOUNTER — Telehealth: Payer: Self-pay | Admitting: Pulmonary Disease

## 2013-12-08 DIAGNOSIS — Z9989 Dependence on other enabling machines and devices: Principal | ICD-10-CM

## 2013-12-08 DIAGNOSIS — G4733 Obstructive sleep apnea (adult) (pediatric): Secondary | ICD-10-CM

## 2013-12-08 NOTE — Telephone Encounter (Signed)
Auto CPAP 10/30/13 to 11/28/13 >> used on 24 of 30 nights with average 5 hrs and 34 min.  Average AHI is 0.6 with median CPAP 7 cm H2O and 95 th percentile CPAP 10 cm H20.   Will have my nurse inform pt that auto CPAP report showed good control of sleep apnea with acceptable hours/night of usage.  She did need higher pressure than what she was using previously.  I have sent order to change her to auto CPAP mode for CPAP.  She should call back if she is having difficulty.

## 2013-12-13 NOTE — Telephone Encounter (Signed)
LMTCB x 1 

## 2013-12-26 NOTE — Telephone Encounter (Signed)
Results have been explained to patient, pt expressed understanding. Nothing further needed.  

## 2014-02-08 ENCOUNTER — Encounter (HOSPITAL_COMMUNITY): Payer: Self-pay | Admitting: Internal Medicine

## 2014-02-13 ENCOUNTER — Ambulatory Visit (HOSPITAL_COMMUNITY): Payer: Medicare Other | Attending: Cardiology | Admitting: Cardiology

## 2014-02-13 DIAGNOSIS — I6529 Occlusion and stenosis of unspecified carotid artery: Secondary | ICD-10-CM

## 2014-02-13 DIAGNOSIS — I8390 Asymptomatic varicose veins of unspecified lower extremity: Secondary | ICD-10-CM

## 2014-02-13 DIAGNOSIS — I6523 Occlusion and stenosis of bilateral carotid arteries: Secondary | ICD-10-CM | POA: Diagnosis not present

## 2014-02-13 DIAGNOSIS — I447 Left bundle-branch block, unspecified: Secondary | ICD-10-CM | POA: Diagnosis not present

## 2014-02-13 NOTE — Progress Notes (Signed)
Carotid duplex performed 

## 2014-02-21 ENCOUNTER — Telehealth: Payer: Self-pay | Admitting: Cardiology

## 2014-02-21 NOTE — Telephone Encounter (Signed)
The patient is aware of her results.  

## 2014-02-21 NOTE — Telephone Encounter (Signed)
New Msg      Pt returning call from Webb Silversmith, thinks its about test results. Please call back.

## 2014-03-17 ENCOUNTER — Other Ambulatory Visit: Payer: Self-pay | Admitting: Physician Assistant

## 2014-04-11 ENCOUNTER — Telehealth: Payer: Self-pay | Admitting: Cardiology

## 2014-04-11 DIAGNOSIS — R600 Localized edema: Secondary | ICD-10-CM

## 2014-04-11 NOTE — Telephone Encounter (Signed)
Pt called because she has been having increased edema in the lower extremities more in the right ankle  for a week. Pt feels discomfort and thruaving  Sensation and puffiness more in the right ankle. The swelling goes away in the AM after sleeping all night. Pt has been taking Furosemide 20 mg and potassium 20 mEq once tablet  3 times a week. Pt thinks that  she needs to take these two medications every other day instad. Pt would like to know what MD recommends.

## 2014-04-11 NOTE — Telephone Encounter (Signed)
Pt c/o swelling: STAT is pt has developed SOB within 24 hours  1. How long have you been experiencing swelling? Gotten worse in the last couple of weeks, using support hose to help with that  2. Where is the swelling located? Ankles  3.  Are you currently taking a "fluid pill"? Yes, averages taking 1 every 3 days  4.  Are you currently SOB? No  5.  Have you traveled recently? No

## 2014-04-11 NOTE — Telephone Encounter (Signed)
lmtcb

## 2014-04-12 NOTE — Telephone Encounter (Signed)
Pt given Dr Claris Gladden recommendations, verbalized understanding, agreed with plan.

## 2014-04-12 NOTE — Telephone Encounter (Signed)
LMTCB

## 2014-04-12 NOTE — Telephone Encounter (Signed)
OK to take daily for 3-4 days then every other day.  Get BMET in 1 week.  Call if not improved.

## 2014-04-12 NOTE — Telephone Encounter (Signed)
LTMCB 

## 2014-04-19 ENCOUNTER — Other Ambulatory Visit (INDEPENDENT_AMBULATORY_CARE_PROVIDER_SITE_OTHER): Payer: Medicare Other | Admitting: *Deleted

## 2014-04-19 DIAGNOSIS — R600 Localized edema: Secondary | ICD-10-CM

## 2014-04-19 LAB — BASIC METABOLIC PANEL
BUN: 16 mg/dL (ref 6–23)
CHLORIDE: 105 meq/L (ref 96–112)
CO2: 34 meq/L — AB (ref 19–32)
Calcium: 9.8 mg/dL (ref 8.4–10.5)
Creatinine, Ser: 0.72 mg/dL (ref 0.40–1.20)
GFR: 84.89 mL/min (ref >60.00)
Glucose, Bld: 82 mg/dL (ref 70–99)
Potassium: 4 mEq/L (ref 3.5–5.1)
SODIUM: 142 meq/L (ref 135–145)

## 2014-05-09 DIAGNOSIS — R609 Edema, unspecified: Secondary | ICD-10-CM | POA: Insufficient documentation

## 2014-05-22 ENCOUNTER — Other Ambulatory Visit: Payer: Self-pay | Admitting: Podiatry

## 2014-05-23 ENCOUNTER — Encounter: Payer: Self-pay | Admitting: Podiatry

## 2014-05-28 ENCOUNTER — Ambulatory Visit: Payer: Medicare Other | Admitting: Podiatry

## 2014-06-19 ENCOUNTER — Encounter: Payer: Self-pay | Admitting: Neurology

## 2014-06-19 ENCOUNTER — Encounter: Payer: Self-pay | Admitting: Cardiology

## 2014-06-19 ENCOUNTER — Ambulatory Visit (INDEPENDENT_AMBULATORY_CARE_PROVIDER_SITE_OTHER): Payer: Medicare Other | Admitting: Neurology

## 2014-06-19 VITALS — BP 132/75 | HR 70 | Ht 66.5 in | Wt 173.0 lb

## 2014-06-19 DIAGNOSIS — R202 Paresthesia of skin: Secondary | ICD-10-CM

## 2014-06-19 DIAGNOSIS — M5416 Radiculopathy, lumbar region: Secondary | ICD-10-CM | POA: Diagnosis not present

## 2014-06-19 NOTE — Progress Notes (Signed)
PATIENT: Marcia Norris DOB: Oct 30, 1945  HISTORICAL  Marcia Norris is a 69 yo RH female referred by her PCP Vicenta Aly for evaluation of bilateral feet paresthesia,   She had a history of age of flutter, status post ablation, also right lumbar radiculopathy, had decompression surgery by Dr. Rita Ohara March 2015, recovered very well, no longer has low back pain  Since summer 2015, she noticed intermittent bilateral plantar feet numbness tingling, right worse than left, especially after bearing weight, gradually getting worse over the past few months, mild bilateral feet discomfort when bearing weight, but no significant lower extremity weakness, no bowel bladder incontinence,   Since summer 2015, she also noticed intermittent right hand fingertips paresthesia, no weakness, no neck pain   REVIEW OF SYSTEMS: Full 14 system review of systems performed and notable only for swelling in legs, joint pain, confusion, numbness  ALLERGIES: Allergies  Allergen Reactions  . Codeine Nausea Only    HOME MEDICATIONS: Current Outpatient Prescriptions  Medication Sig Dispense Refill  . ALPRAZolam (XANAX) 0.5 MG tablet Take 0.25-0.5 mg by mouth 3 (three) times a week.     Marland Kitchen aspirin 81 MG tablet Take 81 mg by mouth every morning.     [name]      Marland Kitchen azelastine (ASTELIN) 137 MCG/SPRAY nasal spray Place 1 spray into the nose daily as needed for rhinitis.     . budesonide-formoterol (SYMBICORT) 160-4.5 MCG/ACT inhaler Inhale 2 puffs into the lungs as needed.     . Calcium Carbonate-Vitamin D (CALCIUM 600+D) 600-400 MG-UNIT per tablet Take 2 tablets by mouth 2 (two) times daily.     . cholecalciferol (VITAMIN D) 1000 UNITS tablet Take 1,000 Units by mouth daily.    . furosemide (LASIX) 20 MG tablet Take 20 mg by mouth. Every other day    . loratadine (CLARITIN) 10 MG tablet Take 10 mg by mouth daily as needed for allergies.     Marland Kitchen losartan  (COZAAR) 25 MG tablet TAKE 1 TABLET (25 MG TOTAL) BY MOUTH DAILY. 30 tablet 6  . Multiple Vitamins-Minerals (ONE-A-DAY 50 PLUS PO) Take 1 tablet by mouth daily.      . potassium chloride SA (KLOR-CON M20) 20 MEQ tablet Take 20 mEq by mouth. Every other day     No current facility-administered medications for this visit.    PAST MEDICAL HISTORY: Past Medical History  Diagnosis Date  . Hyperlipidemia   . Vitamin D deficiency   . Asthma   . Seasonal allergies   . Atrial flutter   . Exertional shortness of breath   . OSA on CPAP 02/16/2012  . Diverticulosis   . Varicose veins     PAST SURGICAL HISTORY: Past Surgical History  Procedure Laterality Date  . Atrial flutter ablation  01/27/2013  . Abdominal hysterectomy  1990's    partial  . Transurethral resection of bladder tumor  ~ 2004  . Cystoscopy      "multiple after bladder tumor removed; released in 2013" (01/27/2013)  . Cataract extraction w/ intraocular lens  implant, bilateral Bilateral 10-12/2012  . Back surgery  04/2013  . Atrial flutter ablation N/A 01/27/2013    Procedure: ATRIAL FLUTTER ABLATION;  Surgeon: Evans Lance, MD;  Location: [facility] CATH LAB;  Service: Cardiovascular;  Laterality: N/A;    FAMILY HISTORY: Family History  Problem Relation Age of Onset  . Cancer Mother   . Allergies Mother   .  Varicose Veins Mother   . Bladder Cancer Father   . Heart disease Father   . Kidney disease Father   . Diabetes Father   . Hyperlipidemia Father   . Hypertension Father   . Diabetes Sister   . Colon cancer Neg Hx     SOCIAL HISTORY:  History   Social History  . Marital Status: Married    Spouse Name: N/A  . Number of Children: 2  . Years of Education: 13   Occupational History  . retired    Social History Main Topics  . Smoking status: Never Smoker   . Smokeless tobacco: Never Used  . Alcohol Use: 0.0 oz/week    0 Standard drinks or equivalent per week     Comment: Occasional glass of wine  .  Drug Use: No  . Sexual Activity: Yes   Other Topics Concern  . Not on file   Social History Narrative   Lives at home with her husband.   Right-handed.   1 cups caffeine/day.           PHYSICAL EXAM   Filed Vitals:   06/19/14 0814  BP: 132/75  Pulse: 70  Height: 5' 6.5" (1.689 m)  Weight: 173 lb (78.472 kg)    Not recorded      Body mass index is 27.51 kg/(m^2).  PHYSICAL EXAMNIATION:  Gen: NAD, conversant, well nourised, obese, well groomed                     Cardiovascular: Regular rate rhythm, no peripheral edema, warm, nontender. Eyes: Conjunctivae clear without exudates or hemorrhage Neck: Supple, no carotid bruise. Pulmonary: Clear to auscultation bilaterally   NEUROLOGICAL EXAM:  MENTAL STATUS: Speech:    Speech is normal; fluent and spontaneous with normal comprehension.  Cognition:    The patient is oriented to person, place, and time;     recent and remote memory intact;     language fluent;     normal attention, concentration,     fund of knowledge.  CRANIAL NERVES: CN II: Visual fields are full to confrontation. Fundoscopic exam is normal with sharp discs and no vascular changes. Venous pulsations are present bilaterally. Pupils are 4 mm and briskly reactive to light. Visual acuity is 20/20 bilaterally. CN III, IV, VI: extraocular movement are normal. No ptosis. CN V: Facial sensation is intact to pinprick in all 3 divisions bilaterally. Corneal responses are intact.  CN VII: Face is symmetric with normal eye closure and smile. CN VIII: Hearing is normal to rubbing fingers CN IX, X: Palate elevates symmetrically. Phonation is normal. CN XI: Head turning and shoulder shrug are intact CN XII: Tongue is midline with normal movements and no atrophy.  MOTOR: There is no pronator drift of out-stretched arms. Muscle bulk and tone are normal. Muscle strength is normal.   Shoulder abduction Shoulder external rotation Elbow flexion Elbow extension  Wrist flexion Wrist extension Finger abduction Hip flexion Knee flexion Knee extension Ankle dorsi flexion Ankle plantar flexion  R 5 5 5 5 5 5 5 5 5 5 5 5   L 5 5 5 5 5 5 5 5 5 5 5 5     REFLEXES: Reflexes are 2+ and symmetric at the biceps, triceps, knees, and ankles. Plantar responses are flexor.  SENSORY: Length dependent decreased light touch, pinprick to distal shin level, preserved bilateral toe vibratory sensation, proprioception  COORDINATION: Rapid alternating movements and fine finger movements are intact. There is no dysmetria  on finger-to-nose and heel-knee-shin. There are no abnormal or extraneous movements.   GAIT/STANCE: Wide-based, cautious gait, able to stand on tiptoe, and heels, mild difficulty with tandem walking Romberg is absent.   DIAGNOSTIC DATA (LABS, IMAGING, TESTING) - I reviewed patient records, labs, notes, testing and imaging myself where available.  Lab Results  Component Value Date   WBC 4.9 03/17/2013   HGB 14.1 03/17/2013   HCT 41.8 03/17/2013   MCV 94.4 03/17/2013   PLT 212 03/17/2013      Component Value Date/Time   NA 142 04/19/2014 1037   K 4.0 04/19/2014 1037   CL 105 04/19/2014 1037   CO2 34* 04/19/2014 1037   GLUCOSE 82 04/19/2014 1037   BUN 16 04/19/2014 1037   CREATININE 0.72 04/19/2014 1037   CALCIUM 9.8 04/19/2014 1037   PROT 7.2 03/17/2013 0850   ALBUMIN 4.0 03/17/2013 0850   AST 27 03/17/2013 0850   ALT 52* 03/17/2013 0850   ALKPHOS 93 03/17/2013 0850   BILITOT 0.5 03/17/2013 0850   GFRNONAA 86* 03/17/2013 0850   GFRAA >90 03/17/2013 0850   No results found for: CHOL, HDL, LDLCALC, LDLDIRECT, TRIG, CHOLHDL Lab Results  Component Value Date   HGBA1C 6.9* 12/21/2012   No results found for: [id number] Lab Results  Component Value Date   TSH 3.595 12/22/2012      ASSESSMENT AND PLAN  Sueellen Kayes Handley is a 69 y.o. female with past medical history of right lumbar radiculopathy, now presenting with bilateral lower  extremity, right hand paresthesia, mildly length dependent sensory changes,  Differentiation diagnosis including peripheral neuropathy, lumbar radiculopathies  EMG nerve conduction study Brain previous MRI lumbar CD and laboratory reports to review  Marcial Pacas, M.D. Ph.D.  [facility] [address] [place], [place] [address] Ph: [phone] Fax: [phone]

## 2014-06-29 ENCOUNTER — Ambulatory Visit (INDEPENDENT_AMBULATORY_CARE_PROVIDER_SITE_OTHER): Payer: Medicare Other | Admitting: Neurology

## 2014-06-29 ENCOUNTER — Encounter: Payer: Self-pay | Admitting: Cardiology

## 2014-06-29 DIAGNOSIS — R202 Paresthesia of skin: Secondary | ICD-10-CM

## 2014-06-29 DIAGNOSIS — M5416 Radiculopathy, lumbar region: Secondary | ICD-10-CM | POA: Diagnosis not present

## 2014-06-29 DIAGNOSIS — Z0289 Encounter for other administrative examinations: Secondary | ICD-10-CM

## 2014-06-29 DIAGNOSIS — G5601 Carpal tunnel syndrome, right upper limb: Secondary | ICD-10-CM

## 2014-06-29 MED ORDER — GABAPENTIN 100 MG PO CAPS: ORAL_CAPSULE | ORAL | Status: DC

## 2014-06-29 NOTE — Procedures (Signed)
   NCS (NERVE CONDUCTION STUDY) WITH EMG (ELECTROMYOGRAPHY) REPORT   STUDY DATE: June 29 2014 PATIENT NAME: Marcia Norris DOB: Sep 18, 1945 MRN: [id number]    TECHNOLOGIST: Laretta Alstrom ELECTROMYOGRAPHER: Marcial Pacas M.D.  CLINICAL INFORMATION:  69 year old female, with history of lumbar stenosis, status post decompression, now presenting with a year history of slow worsening bilateral plantar feet paresthesia,  FINDINGS: NERVE CONDUCTION STUDY: Bilateral peroneal, sural sensory responses were absent. Bilateral peroneal to EDB motor responses showed severely decreased C map amplitude. Bilateral tibial motor responses were normal. The lateral tibial H reflexes were absent.  Right ulnar sensory and motor responses were normal. Right median sensory response showed mildly prolonged peak latency, with normal snap amplitude. Right median motor responses showed moderately prolonged distal latency was severely prolonged F wave latency, with preserved C map amplitude, normal conduction velocity.   NEEDLE ELECTROMYOGRAPHY: Selective needle examination was performed at bilateral lower extremity muscles, lumbosacral paraspinal muscles, right upper extremity muscles, right cervical paraspinal muscles.  Right tibialis anterior, right tibialis posterior, medial gastrocnemius, peroneal longus, vastus lateralis: Increased insertional activity, no spontaneous activity, enlarge complex motor unit potential, with decreased recruitment patterns.  Left tibialis anterior, tibialis posterior, vastus lateralis, normal insertion activity, mildly enlarged complex motor unit potential, with mildly decreased recruitment patterns.  There was spontaneous activity at right lumbosacral paraspinal muscles, 1-2 plus spontaneous activity at right L4, L5, S1  Right abductor pollicis brevis: Normal insertion activity, no spontaneous activity, mildly enlarged motor unit potential, with mildly decreased recruitment  patterns.  Needle examination of right biceps, triceps, deltoid, extensor digitorum communis was normal  There was no spontaneous activity at right cervical paraspinal muscles, right C5, 6, and 7    IMPRESSION:   This is an abnormal study. There is electrodiagnostic evidence of mild axonal length dependent  sensorimotor polyneuropathy.  In addition, there is electrodiagnostic evidence of chronic bilateral lumbar sacral radiculopathy, involving bilateral L4, L5, S1 nerve roots, right worse than left.  There was evidence of moderate right carpal tunnel syndromes. There was no evidence of right cervical radiculopathy.    INTERPRETING PHYSICIAN:   Marcial Pacas M.D. Ph.D. [facility] [address] [place], [place] [address] [phone]

## 2014-06-29 NOTE — Progress Notes (Signed)
PATIENT: Marcia Norris DOB: 05/13/1945  HISTORICAL  Marcia Norris is a 69 yo RH female referred by her PCP Vicenta Aly for evaluation of bilateral feet paresthesia,   She had a history of atrial flutter, status post ablation, also right lumbar radiculopathy, had decompression surgery by Dr. Rita Ohara March 2015, recovered very well, no longer has low back pain  Since summer 2015, she noticed intermittent bilateral plantar feet numbness tingling, right worse than left, especially after bearing weight, gradually getting worse over the past few months, mild bilateral feet discomfort when bearing weight, but no significant lower extremity weakness, no bowel bladder incontinence,   Since summer 2015, she also noticed intermittent right hand fingertips paresthesia, no weakness, no neck pain  UPDATE April 29th 2016:  I have reviewed MRI of the lumbar spine from April 06 2013 at [facility], L3-4, L4-5 at least moderate spinal canal stenosis, due to disc herniation, facet hypertrophy,  Patient had outpatient lumbar decompression surgery by Dr.Nudleman in March 2015,  Laboratory evaluation in November 2015, Cholesterol 179, A1c 6.6, normal CMP, CBC, most recent one was February 2016, normal BMP, with creatinine 0.7 2,   REVIEW OF SYSTEMS: Full 14 system review of systems performed and notable only for swelling in legs, joint pain, confusion, numbness  ALLERGIES: Allergies  Allergen Reactions  . Codeine Nausea Only    HOME MEDICATIONS: Current Outpatient Prescriptions  Medication Sig Dispense Refill  . ALPRAZolam (XANAX) 0.5 MG tablet Take 0.25-0.5 mg by mouth 3 (three) times a week.     Marland Kitchen aspirin 81 MG tablet Take 81 mg by mouth every morning.     [name]      Marland Kitchen azelastine (ASTELIN) 137 MCG/SPRAY nasal spray Place 1 spray into the nose daily as needed for rhinitis.     . budesonide-formoterol (SYMBICORT) 160-4.5  MCG/ACT inhaler Inhale 2 puffs into the lungs as needed.     . Calcium Carbonate-Vitamin D (CALCIUM 600+D) 600-400 MG-UNIT per tablet Take 2 tablets by mouth 2 (two) times daily.     . cholecalciferol (VITAMIN D) 1000 UNITS tablet Take 1,000 Units by mouth daily.    . furosemide (LASIX) 20 MG tablet Take 20 mg by mouth. Every other day    . loratadine (CLARITIN) 10 MG tablet Take 10 mg by mouth daily as needed for allergies.     Marland Kitchen losartan (COZAAR) 25 MG tablet TAKE 1 TABLET (25 MG TOTAL) BY MOUTH DAILY. 30 tablet 6  . Multiple Vitamins-Minerals (ONE-A-DAY 50 PLUS PO) Take 1 tablet by mouth daily.      . potassium chloride SA (KLOR-CON M20) 20 MEQ tablet Take 20 mEq by mouth. Every other day     No current facility-administered medications for this visit.    PAST MEDICAL HISTORY: Past Medical History  Diagnosis Date  . Hyperlipidemia   . Vitamin D deficiency   . Asthma   . Seasonal allergies   . Atrial flutter   . Exertional shortness of breath   . OSA on CPAP 02/16/2012  . Diverticulosis   . Varicose veins     PAST SURGICAL HISTORY: Past Surgical History  Procedure Laterality Date  . Atrial flutter ablation  01/27/2013  . Abdominal hysterectomy  1990's    partial  . Transurethral resection of bladder tumor  ~ 2004  . Cystoscopy      "multiple after bladder tumor removed; released in 2013" (01/27/2013)  .  Cataract extraction w/ intraocular lens  implant, bilateral Bilateral 10-12/2012  . Back surgery  04/2013  . Atrial flutter ablation N/A 01/27/2013    Procedure: ATRIAL FLUTTER ABLATION;  Surgeon: Evans Lance, MD;  Location: [facility] CATH LAB;  Service: Cardiovascular;  Laterality: N/A;    FAMILY HISTORY: Family History  Problem Relation Age of Onset  . Cancer Mother   . Allergies Mother   . Varicose Veins Mother   . Bladder Cancer Father   . Heart disease Father   . Kidney disease Father   . Diabetes Father   . Hyperlipidemia Father   . Hypertension Father   .  Diabetes Sister   . Colon cancer Neg Hx     SOCIAL HISTORY:  History   Social History  . Marital Status: Married    Spouse Name: N/A  . Number of Children: 2  . Years of Education: 13   Occupational History  . retired    Social History Main Topics  . Smoking status: Never Smoker   . Smokeless tobacco: Never Used  . Alcohol Use: 0.0 oz/week    0 Standard drinks or equivalent per week     Comment: Occasional glass of wine  . Drug Use: No  . Sexual Activity: Yes   Other Topics Concern  . Not on file   Social History Narrative   Lives at home with her husband.   Right-handed.   1 cups caffeine/day.           PHYSICAL EXAM   There were no vitals filed for this visit.  Not recorded      There is no weight on file to calculate BMI.  PHYSICAL EXAMNIATION:  Gen: NAD, conversant, well nourised, obese, well groomed                     Cardiovascular: Regular rate rhythm, no peripheral edema, warm, nontender. Eyes: Conjunctivae clear without exudates or hemorrhage Neck: Supple, no carotid bruise. Pulmonary: Clear to auscultation bilaterally   NEUROLOGICAL EXAM:  MENTAL STATUS: Speech:    Speech is normal; fluent and spontaneous with normal comprehension.  Cognition:    The patient is oriented to person, place, and time;     recent and remote memory intact;     language fluent;     normal attention, concentration,     fund of knowledge.  CRANIAL NERVES: CN II: Visual fields are full to confrontation. Fundoscopic exam is normal with sharp discs and no vascular changes. Venous pulsations are present bilaterally. Pupils are 4 mm and briskly reactive to light. Visual acuity is 20/20 bilaterally. CN III, IV, VI: extraocular movement are normal. No ptosis. CN V: Facial sensation is intact to pinprick in all 3 divisions bilaterally. Corneal responses are intact.  CN VII: Face is symmetric with normal eye closure and smile. CN VIII: Hearing is normal to rubbing  fingers CN IX, X: Palate elevates symmetrically. Phonation is normal. CN XI: Head turning and shoulder shrug are intact CN XII: Tongue is midline with normal movements and no atrophy.  MOTOR: There is no pronator drift of out-stretched arms. Muscle bulk and tone are normal. Muscle strength is normal. With exception of mild right abductor pollicis brevis, opponent weakness    REFLEXES: Reflexes are 2+ and symmetric at the biceps, triceps, knees, and trace at ankles. Plantar responses are flexor.  SENSORY: Length dependent decreased light touch, pinprick to distal shin level, preserved bilateral toe vibratory sensation, proprioception  COORDINATION: Rapid  alternating movements and fine finger movements are intact. There is no dysmetria on finger-to-nose and heel-knee-shin. There are no abnormal or extraneous movements.   GAIT/STANCE: Wide-based, cautious gait, able to stand on tiptoe, and heels, mild difficulty with tandem walking Romberg is absent.   DIAGNOSTIC DATA (LABS, IMAGING, TESTING) - I reviewed patient records, labs, notes, testing and imaging myself where available.  Lab Results  Component Value Date   WBC 4.9 03/17/2013   HGB 14.1 03/17/2013   HCT 41.8 03/17/2013   MCV 94.4 03/17/2013   PLT 212 03/17/2013      Component Value Date/Time   NA 142 04/19/2014 1037   K 4.0 04/19/2014 1037   CL 105 04/19/2014 1037   CO2 34* 04/19/2014 1037   GLUCOSE 82 04/19/2014 1037   BUN 16 04/19/2014 1037   CREATININE 0.72 04/19/2014 1037   CALCIUM 9.8 04/19/2014 1037   PROT 7.2 03/17/2013 0850   ALBUMIN 4.0 03/17/2013 0850   AST 27 03/17/2013 0850   ALT 52* 03/17/2013 0850   ALKPHOS 93 03/17/2013 0850   BILITOT 0.5 03/17/2013 0850   GFRNONAA 86* 03/17/2013 0850   GFRAA >90 03/17/2013 0850   No results found for: CHOL, HDL, LDLCALC, LDLDIRECT, TRIG, CHOLHDL Lab Results  Component Value Date   HGBA1C 6.9* 12/21/2012   No results found for: [id number] Lab Results    Component Value Date   TSH 3.595 12/22/2012      ASSESSMENT AND PLAN  Nakeeta Sebastiani Pollan is a 69 y.o. female with past medical history of right lumbar radiculopathy, now presenting with bilateral lower extremity, right hand paresthesia, mildly length dependent sensory changes, electrodiagnostic study today confirmed length dependent mild axonal peripheral neuropathy, chronic bilateral lumbar sacral radiculopathy, consistent with her previous history of lumbar stenosis, also evidence of moderate right carpal tunnel syndromes.  Laboratory evaluation for etiology of her mild peripheral neuropathy, most likely due to her prediabetes Gabapentin 100 mg 1-3 tablets every night  Return to clinic in 2 month   Marcial Pacas, M.D. Ph.D.  [facility] [address], [place], [place] [address] Ph: [phone] Fax: [phone]

## 2014-07-13 NOTE — Progress Notes (Signed)
Electrodiagnostic study today, please see separate procedure report

## 2014-07-26 ENCOUNTER — Ambulatory Visit (INDEPENDENT_AMBULATORY_CARE_PROVIDER_SITE_OTHER): Payer: Medicare Other | Admitting: Cardiology

## 2014-07-26 ENCOUNTER — Encounter: Payer: Self-pay | Admitting: Cardiology

## 2014-07-26 VITALS — BP 132/78 | HR 67 | Ht 66.5 in | Wt 173.0 lb

## 2014-07-26 DIAGNOSIS — I471 Supraventricular tachycardia, unspecified: Secondary | ICD-10-CM

## 2014-07-26 DIAGNOSIS — I447 Left bundle-branch block, unspecified: Secondary | ICD-10-CM

## 2014-07-26 NOTE — Progress Notes (Signed)
Patient ID: Marcia Norris, female   DOB: [birth year], 69 y.o.   MRN: [id number] PCP: Dr. Melford Aase  69 yo with history of OSA, mild carotid stenosis, AVNRT/AVRT ablation, and chronic LBBB presents for cardiology followup.  She ended up having AVNRT and AVRT ablation in 12/14.  She has been doing well since then.  No tachypalpitations.  No chest pain, no exertional dyspnea.  She continues to have painful varicose veins in the lower legs bilaterally.  She was evaluated by vascular surgery and decided not to have any procedure done.  She uses support hose.    Labs (5/15): K 3.7, creatinine 0.7   ECG: NSR, LBBB  PMH: 1. OSA: On CPAP 2. Hyperlipidemia 3. Asthma 4. Chronic LBBB: adenosine Myoview in 2/14 showed EF 62%, no ischemia or infarction.  Echo (8/14) with EF 55-60%, mild MR.   5. Carotid stenosis: Carotid dopplers showed 0-39% RICA stenosis in 12/12. Carotids (12/14) with bilateral 1-39% ICA stenosis. Carotids (12/15) with mild stenosis.  6. AVNRT and AVRT s/p RFCA in 12/14.  7. HTN 8. Varicose veins 9. Peripheral neuropathy: Gabapentin  SH: Married, nonsmoker, lives in [place], retired, 2 kids.   FH: Father with CVA, CHF, ESRD.  ROS: All systems reviewed and negative except as per HPI.    Current Outpatient Prescriptions  Medication Sig Dispense Refill  . ALPRAZolam (XANAX) 0.5 MG tablet Take 0.25 mg by mouth 3 (three) times a week.     Marland Kitchen aspirin 81 MG tablet Take 81 mg by mouth every morning.     [name]      Marland Kitchen azelastine (ASTELIN) 137 MCG/SPRAY nasal spray Place 1 spray into the nose daily as needed for rhinitis.     . budesonide-formoterol (SYMBICORT) 160-4.5 MCG/ACT inhaler Inhale 2 puffs into the lungs as needed.     . Calcium Carbonate-Vitamin D (CALCIUM 600+D) 600-400 MG-UNIT per tablet Take 2 tablets by mouth 2 (two) times daily.     . cholecalciferol (VITAMIN D) 1000 UNITS tablet Take 1,000 Units by mouth daily.     . furosemide (LASIX) 20 MG tablet Take 20 mg by mouth. Every other day    . gabapentin (NEURONTIN) 100 MG capsule 1- 3 tabs po qhs 90 capsule 3  . loratadine (CLARITIN) 10 MG tablet Take 10 mg by mouth daily as needed for allergies.     Marland Kitchen losartan (COZAAR) 25 MG tablet TAKE 1 TABLET (25 MG TOTAL) BY MOUTH DAILY. 30 tablet 6  . Multiple Vitamins-Minerals (ONE-A-DAY 50 PLUS PO) Take 1 tablet by mouth daily.      . potassium chloride SA (KLOR-CON M20) 20 MEQ tablet Take 20 mEq by mouth. Every other day     No current facility-administered medications for this visit.    BP 132/78 mmHg  Pulse 67  Ht 5' 6.5" (1.689 m)  Wt 173 lb (78.472 kg)  BMI 27.51 kg/m2 General: NAD Neck: No JVD, no thyromegaly or thyroid nodule.  Lungs: Clear to auscultation bilaterally with normal respiratory effort. CV: Nondisplaced PMI.  Heart regular S1/S2, paradoxical split S2, no S3/S4.  Trace ankle edema.  Right carotid bruit.  Normal pedal pulses.  Abdomen: Soft, nontender, no hepatosplenomegaly, no distention.  Skin: Intact without lesions or rashes.  Neurologic: Alert and oriented x 3.  Psych: Normal affect. Extremities: No clubbing or cyanosis. Bilateral venous varicosities.   Assessment/Plan: 1. LBBB: Chronic.  No evidence for significant CAD by recent adenosine myoview  or by symptoms. EF preserved on echo in 8/14.  2. Carotid bruit: Mild carotid stenosis.  Can repeat dopplers in 12/17.   3. Varicose veins: Using support hose, this helps.  4. H/o AVNRT/AVRT: No symptomatic recurrence since ablation.    Followup in 1 year.   Loralie Champagne 07/26/2014

## 2014-07-26 NOTE — Patient Instructions (Signed)
Medication Instructions:  No changes today.  Labwork: None today  Testing/Procedures: None today  Follow-Up: Your physician wants you to follow-up in: 1 year with Dr Aundra Dubin. (May 2017). You will receive a reminder letter in the mail two months in advance. If you don't receive a letter, please call our office to schedule the follow-up appointment.

## 2014-07-27 DIAGNOSIS — G629 Polyneuropathy, unspecified: Secondary | ICD-10-CM | POA: Insufficient documentation

## 2014-08-30 ENCOUNTER — Ambulatory Visit: Payer: Medicare Other | Admitting: Neurology

## 2014-08-30 ENCOUNTER — Ambulatory Visit (INDEPENDENT_AMBULATORY_CARE_PROVIDER_SITE_OTHER): Payer: Medicare Other | Admitting: Neurology

## 2014-08-30 ENCOUNTER — Encounter: Payer: Self-pay | Admitting: Neurology

## 2014-08-30 VITALS — BP 117/67 | HR 76 | Ht 66.5 in | Wt 173.0 lb

## 2014-08-30 DIAGNOSIS — R202 Paresthesia of skin: Secondary | ICD-10-CM | POA: Diagnosis not present

## 2014-08-30 DIAGNOSIS — G629 Polyneuropathy, unspecified: Secondary | ICD-10-CM | POA: Diagnosis not present

## 2014-08-30 DIAGNOSIS — M5416 Radiculopathy, lumbar region: Secondary | ICD-10-CM

## 2014-08-30 NOTE — Progress Notes (Signed)
Chief Complaint  Patient presents with  . Numbness    Feels her symptoms have improved with taking gabapentin 100mg , one capsule at bedtime.        PATIENT: Zenovia Justman Freeman DOB: 07/22/1945  HISTORICAL  RHYLIE STEHR is a 69 yo RH female referred by her PCP Vicenta Aly for evaluation of bilateral feet paresthesia,   She had a history of atrial flutter, status post ablation, also right lumbar radiculopathy, had decompression surgery by Dr. Rita Ohara March 2015, recovered very well, no longer has low back pain  Since summer 2015, she noticed intermittent bilateral plantar feet numbness tingling, right worse than left, especially after bearing weight, gradually getting worse over the past few months, mild bilateral feet discomfort when bearing weight, but no significant lower extremity weakness, no bowel bladder incontinence,   Since summer 2015, she also noticed intermittent right hand fingertips paresthesia, no weakness, no neck pain  UPDATE April 29th 2016: I have reviewed MRI of the lumbar spine from April 06 2013 at [facility], L3-4, L4-5 at least moderate spinal canal stenosis, due to disc herniation, facet hypertrophy, Patient had outpatient lumbar decompression surgery by Dr.Nudleman in March 2015, Laboratory evaluation in November 2015, Cholesterol 179, A1c 6.6, normal CMP, CBC, most recent one was February 2016, normal BMP, with creatinine 0.7 2,  UPDATE August 30 2014: She is taking Gabapentin 100mg  qhs, it has helped her sleep, we have reviewed previous laboratory, A1c has been elevated since 2014, it was 6.9  Electrodiagnostic study in June 29 2014 consistent with mild axonal peripheral neuropathy   REVIEW OF SYSTEMS: Full 14 system review of systems performed and notable only for as above  ALLERGIES: Allergies  Allergen Reactions  . Codeine Nausea Only    HOME MEDICATIONS: Current Outpatient Prescriptions  Medication Sig Dispense Refill  . ALPRAZolam  (XANAX) 0.5 MG tablet Take 0.25 mg by mouth 3 (three) times a week.     Marland Kitchen aspirin 81 MG tablet Take 81 mg by mouth every morning.     [name]      Marland Kitchen azelastine (ASTELIN) 137 MCG/SPRAY nasal spray Place 1 spray into the nose daily as needed for rhinitis.     . budesonide-formoterol (SYMBICORT) 160-4.5 MCG/ACT inhaler Inhale 2 puffs into the lungs as needed.     . Calcium Carbonate-Vitamin D (CALCIUM 600+D) 600-400 MG-UNIT per tablet Take 2 tablets by mouth 2 (two) times daily.     . cholecalciferol (VITAMIN D) 1000 UNITS tablet Take 1,000 Units by mouth daily.    . furosemide (LASIX) 20 MG tablet Take 20 mg by mouth. Every other day    . gabapentin (NEURONTIN) 100 MG capsule 1- 3 tabs po qhs 90 capsule 3  . loratadine (CLARITIN) 10 MG tablet Take 10 mg by mouth daily as needed for allergies.     Marland Kitchen losartan (COZAAR) 25 MG tablet TAKE 1 TABLET (25 MG TOTAL) BY MOUTH DAILY. 30 tablet 6  . Multiple Vitamins-Minerals (ONE-A-DAY 50 PLUS PO) Take 1 tablet by mouth daily.      . potassium chloride SA (KLOR-CON M20) 20 MEQ tablet Take 20 mEq by mouth. Every other day     No current facility-administered medications for this visit.    PAST MEDICAL HISTORY: Past Medical History  Diagnosis Date  . Hyperlipidemia   . Vitamin D deficiency   . Asthma   . Seasonal allergies   . Atrial flutter   .  Exertional shortness of breath   . OSA on CPAP 02/16/2012  . Diverticulosis   . Varicose veins     PAST SURGICAL HISTORY: Past Surgical History  Procedure Laterality Date  . Atrial flutter ablation  01/27/2013  . Abdominal hysterectomy  1990's    partial  . Transurethral resection of bladder tumor  ~ 2004  . Cystoscopy      "multiple after bladder tumor removed; released in 2013" (01/27/2013)  . Cataract extraction w/ intraocular lens  implant, bilateral Bilateral 10-12/2012  . Back surgery  04/2013  . Atrial flutter ablation N/A 01/27/2013     Procedure: ATRIAL FLUTTER ABLATION;  Surgeon: Evans Lance, MD;  Location: [facility] CATH LAB;  Service: Cardiovascular;  Laterality: N/A;    FAMILY HISTORY: Family History  Problem Relation Age of Onset  . Cancer Mother   . Allergies Mother   . Varicose Veins Mother   . Bladder Cancer Father   . Heart disease Father   . Kidney disease Father   . Diabetes Father   . Hyperlipidemia Father   . Hypertension Father   . Diabetes Sister   . Colon cancer Neg Hx     SOCIAL HISTORY:  History   Social History  . Marital Status: Married    Spouse Name: N/A  . Number of Children: 2  . Years of Education: 13   Occupational History  . retired    Social History Main Topics  . Smoking status: Never Smoker   . Smokeless tobacco: Never Used  . Alcohol Use: 0.0 oz/week    0 Standard drinks or equivalent per week     Comment: Occasional glass of wine  . Drug Use: No  . Sexual Activity: Yes   Other Topics Concern  . Not on file   Social History Narrative   Lives at home with her husband.   Right-handed.   1 cups caffeine/day.           PHYSICAL EXAM   Filed Vitals:   08/30/14 1028  BP: 117/67  Pulse: 76  Height: 5' 6.5" (1.689 m)  Weight: 173 lb (78.472 kg)    Not recorded      Body mass index is 27.51 kg/(m^2).  PHYSICAL EXAMNIATION:  Gen: NAD, conversant, well nourised, obese, well groomed                     Cardiovascular: Regular rate rhythm, no peripheral edema, warm, nontender. Eyes: Conjunctivae clear without exudates or hemorrhage Neck: Supple, no carotid bruise. Pulmonary: Clear to auscultation bilaterally   NEUROLOGICAL EXAM:  MENTAL STATUS: Speech:    Speech is normal; fluent and spontaneous with normal comprehension.  Cognition:    The patient is oriented to person, place, and time;     recent and remote memory intact;     language fluent;     normal attention, concentration,     fund of knowledge.  CRANIAL NERVES: CN II: Visual fields are  full to confrontation. Fundoscopic exam is normal with sharp discs and no vascular changes. Pupils were equal round reactive to light. CN III, IV, VI: extraocular movement are normal. No ptosis. CN V: Facial sensation is intact to pinprick in all 3 divisions bilaterally. Corneal responses are intact.  CN VII: Face is symmetric with normal eye closure and smile. CN VIII: Hearing is normal to rubbing fingers CN IX, X: Palate elevates symmetrically. Phonation is normal. CN XI: Head turning and shoulder shrug are intact CN XII:  Tongue is midline with normal movements and no atrophy.  MOTOR: There is no pronator drift of out-stretched arms. Muscle bulk and tone are normal. Muscle strength is normal. With exception of mild right abductor pollicis brevis, opponent weakness    REFLEXES: Reflexes are 2+ and symmetric at the biceps, triceps, knees, and trace at ankles. Plantar responses are flexor.  SENSORY: Length dependent decreased light touch, pinprick to distal shin level, preserved bilateral toe vibratory sensation, proprioception  COORDINATION: Rapid alternating movements and fine finger movements are intact. There is no dysmetria on finger-to-nose and heel-knee-shin. There are no abnormal or extraneous movements.   GAIT/STANCE: Wide-based, cautious gait, able to stand on tiptoe, and heels, mild difficulty with tandem walking Romberg is absent.   DIAGNOSTIC DATA (LABS, IMAGING, TESTING) - I reviewed patient records, labs, notes, testing and imaging myself where available.  Lab Results  Component Value Date   WBC 4.9 03/17/2013   HGB 14.1 03/17/2013   HCT 41.8 03/17/2013   MCV 94.4 03/17/2013   PLT 212 03/17/2013      Component Value Date/Time   NA 142 04/19/2014 1037   K 4.0 04/19/2014 1037   CL 105 04/19/2014 1037   CO2 34* 04/19/2014 1037   GLUCOSE 82 04/19/2014 1037   BUN 16 04/19/2014 1037   CREATININE 0.72 04/19/2014 1037   CALCIUM 9.8 04/19/2014 1037   PROT 7.2  03/17/2013 0850   ALBUMIN 4.0 03/17/2013 0850   AST 27 03/17/2013 0850   ALT 52* 03/17/2013 0850   ALKPHOS 93 03/17/2013 0850   BILITOT 0.5 03/17/2013 0850   GFRNONAA 86* 03/17/2013 0850   GFRAA >90 03/17/2013 0850   No results found for: CHOL, HDL, LDLCALC, LDLDIRECT, TRIG, CHOLHDL Lab Results  Component Value Date   HGBA1C 6.9* 12/21/2012   No results found for: [id number] Lab Results  Component Value Date   TSH 3.595 12/22/2012      ASSESSMENT AND PLAN  Ephrata Verville Grenda is a 69 y.o. female with past medical history of right lumbar radiculopathy, now presenting with bilateral lower extremity, right hand paresthesia, mildly length dependent sensory changes, electrodiagnostic study today confirmed length dependent mild axonal peripheral neuropathy, chronic bilateral lumbar sacral radiculopathy, consistent with her previous history of lumbar stenosis, also evidence of moderate right carpal tunnel syndromes.  Peripheral neuropathy, most likely due to her diabetes Gabapentin 100 mg 1-3 tablets every night  Return to clinic for new issues   Marcial Pacas, M.D. Ph.D.  [facility] [address], [place]  [place], [place] [address] Ph: [phone] Fax: [phone]

## 2014-09-13 ENCOUNTER — Ambulatory Visit: Payer: Medicare Other | Admitting: Cardiology

## 2014-10-29 ENCOUNTER — Encounter: Payer: Self-pay | Admitting: Cardiology

## 2014-10-29 MED ORDER — POTASSIUM CHLORIDE CRYS ER 20 MEQ PO TBCR: 20.0000 meq | EXTENDED_RELEASE_TABLET | Freq: Every day | ORAL | Status: DC

## 2014-10-30 ENCOUNTER — Telehealth: Payer: Self-pay

## 2014-10-30 NOTE — Telephone Encounter (Signed)
potassium chloride SA (KLOR-CON M20) 20 MEQ tablet Take 20 mEq by mouth. Every other d         Office visit 07/26/14 says same directions

## 2014-10-31 MED ORDER — POTASSIUM CHLORIDE CRYS ER 20 MEQ PO TBCR: EXTENDED_RELEASE_TABLET | ORAL | Status: DC

## 2014-11-08 ENCOUNTER — Ambulatory Visit (INDEPENDENT_AMBULATORY_CARE_PROVIDER_SITE_OTHER): Payer: Medicare Other | Admitting: Podiatry

## 2014-11-08 ENCOUNTER — Ambulatory Visit (INDEPENDENT_AMBULATORY_CARE_PROVIDER_SITE_OTHER): Payer: Medicare Other

## 2014-11-08 ENCOUNTER — Encounter: Payer: Self-pay | Admitting: Podiatry

## 2014-11-08 VITALS — BP 144/68 | HR 71 | Resp 16

## 2014-11-08 DIAGNOSIS — M25572 Pain in left ankle and joints of left foot: Secondary | ICD-10-CM

## 2014-11-08 DIAGNOSIS — M779 Enthesopathy, unspecified: Secondary | ICD-10-CM

## 2014-11-08 MED ORDER — TRIAMCINOLONE ACETONIDE 10 MG/ML IJ SUSP
10.0000 mg | Freq: Once | INTRAMUSCULAR | Status: AC
  Administered 2014-11-08: 10 mg

## 2014-11-08 NOTE — Progress Notes (Signed)
Subjective:     Patient ID: Marcia Norris, female   DOB: Oct 04, 1945, 69 y.o.   MRN: [id number]  HPI patient presents with quite a bit of discomfort in the third and fourth joints of the foot and states that it makes it hard to walk and it's been going on for about 6 months. Does not remember specific injury or other issues   Review of Systems  All other systems reviewed and are negative.      Objective:   Physical Exam  Constitutional: She is oriented to person, place, and time.  Cardiovascular: Intact distal pulses.   Musculoskeletal: Normal range of motion.  Neurological: She is oriented to person, place, and time.  Skin: Skin is warm and dry.  Nursing note and vitals reviewed.  neurovascular status intact muscle strength adequate range of motion within normal limits with elevation of the lesser digits left over right with rigid contracture and prominence of the metatarsal heads left over right. The third and fourth metatarsophalangeal joints left are very inflamed and sore when palpated and patient's noted to have good digital perfusion and is well oriented 3     Assessment:     Plantar flex metatarsals with inflammation around the third and fourth metatarsophalangeal joint left    Plan:     H&P and x-rays reviewed with patient and at this point I did a proximal nerve block left foot aspirated the third and fourth MPJs and injected with a quarter cc of dexamethasone Kenalog and each joint and applied thick plantar pad to reduce pressure on the joint surface and discuss long-term orthotics. Reappoint to reevaluate the results of this in 2 weeks

## 2014-11-14 ENCOUNTER — Other Ambulatory Visit: Payer: Self-pay | Admitting: Cardiology

## 2014-11-15 ENCOUNTER — Other Ambulatory Visit: Payer: Self-pay | Admitting: Cardiology

## 2014-11-22 ENCOUNTER — Encounter: Payer: Self-pay | Admitting: Podiatry

## 2014-11-22 ENCOUNTER — Ambulatory Visit (INDEPENDENT_AMBULATORY_CARE_PROVIDER_SITE_OTHER): Payer: Medicare Other | Admitting: Podiatry

## 2014-11-22 VITALS — BP 138/65 | HR 70 | Resp 16

## 2014-11-22 DIAGNOSIS — M779 Enthesopathy, unspecified: Secondary | ICD-10-CM | POA: Diagnosis not present

## 2014-11-22 DIAGNOSIS — M79672 Pain in left foot: Secondary | ICD-10-CM

## 2014-11-22 NOTE — Progress Notes (Signed)
Subjective:     Patient ID: LAELAH SIRAVO, female   DOB: Dec 17, 1945, 69 y.o.   MRN: [id number]  HPI patient presents stating that her foot feels quite a bit better but she knows her bones are exposed and that she's probably getting need more types of support to try to take pressure off her feet   Review of Systems     Objective:   Physical Exam Neurovascular status is found to be intact with inflammatory capsulitis third and fourth metatarsophalangeal joint left which has improved some with prominent metatarsal heads bilateral and diminishment of the fat pad bilateral    Assessment:     Inflammatory capsulitis third and fourth metatarsophalangeal joint left that's painful when pressed but also related to the diminishment of the plantar fat pad    Plan:     Discussed condition at great length and reviewed x-rays. Discussed importance of weight control and today have recommended orthotics to try to reduce the plantar stresses and scanned for custom orthotic devices to reduce the plantar stress against the metatarsal phalangeal joints and at. I did dispensed metatarsal pads for short-term to keep pressure off her feet

## 2014-11-29 ENCOUNTER — Ambulatory Visit: Payer: Medicare Other | Admitting: Cardiology

## 2014-12-14 ENCOUNTER — Ambulatory Visit: Payer: Medicare Other | Admitting: *Deleted

## 2014-12-14 DIAGNOSIS — M779 Enthesopathy, unspecified: Secondary | ICD-10-CM

## 2014-12-14 NOTE — Patient Instructions (Signed)

## 2014-12-14 NOTE — Progress Notes (Signed)
Patient ID: Marcia Norris, female   DOB: Jul 23, 1945, 69 y.o.   MRN: [id number] Patient presents for orthotic pick up.  Verbal and written break in and wear instructions given.  Patient will follow up in 4 weeks if symptoms worsen or fail to improve.

## 2015-01-09 ENCOUNTER — Telehealth: Payer: Self-pay | Admitting: *Deleted

## 2015-01-09 NOTE — Telephone Encounter (Addendum)
Pt states she has an appeal for the orthotics prescribed for her neuropathy, she has an appt 01/10/2015 with Dr. Paulla Dolly and would like documentation justifying the orthotics for the diagnosis of neuropathy.

## 2015-01-10 ENCOUNTER — Ambulatory Visit: Payer: Medicare Other | Admitting: Podiatry

## 2015-01-14 ENCOUNTER — Ambulatory Visit (INDEPENDENT_AMBULATORY_CARE_PROVIDER_SITE_OTHER): Payer: Medicare Other | Admitting: Podiatry

## 2015-01-14 DIAGNOSIS — L600 Ingrowing nail: Secondary | ICD-10-CM | POA: Diagnosis not present

## 2015-01-14 DIAGNOSIS — M722 Plantar fascial fibromatosis: Secondary | ICD-10-CM

## 2015-01-14 MED ORDER — TRIAMCINOLONE ACETONIDE 10 MG/ML IJ SUSP
10.0000 mg | Freq: Once | INTRAMUSCULAR | Status: AC
  Administered 2015-01-14: 10 mg

## 2015-01-14 NOTE — Patient Instructions (Signed)

## 2015-01-15 ENCOUNTER — Telehealth: Payer: Self-pay | Admitting: *Deleted

## 2015-01-15 NOTE — Telephone Encounter (Signed)
Left message for patient at [phone] (Cell #) to check to see how they were doing from their ingrown toenail procedure that was performed on Monday, January 14, 2015. Waiting for a response.

## 2015-01-16 NOTE — Progress Notes (Signed)
Subjective:     Patient ID: Marcia Norris, female   DOB: January 04, 1946, 69 y.o.   MRN: [id number]  HPI patient presents stating my top of my left foot has been bothering me and I have an ingrown toenail which I cannot take out myself on my second toe and I like to have that fixed   Review of Systems     Objective:   Physical Exam Neurovascular status intact muscle strength was adequate with range of motion within normal limits. Patient's noted to have incurvated second nail right lateral border that's painful when pressed and is noted to have moderate dorsal discomfort with inflammation noted on the left foot    Assessment:     Tendinitis-like condition along with ingrown toenail deformity right    Plan:     H&P and conditions reviewed with patient and today I have recommended correction of the ingrown toenail and explained the surgery and risk and she wants procedure. I injected with 60 mg of Xylocaine Marcaine mixture and after proper prep to the toe I removed the lateral corner exposed the matrix and applied phenol 3 applications 30 seconds followed by alcohol lavage and sterile dressing. Gave instructions on soaks and I also went ahead today and I injected the dorsal tendon complex 3 mg Kenalog 5 mg Xylocaine

## 2015-04-17 ENCOUNTER — Other Ambulatory Visit (HOSPITAL_COMMUNITY): Payer: Self-pay | Admitting: *Deleted

## 2015-04-17 MED ORDER — LOSARTAN POTASSIUM 25 MG PO TABS: 25.0000 mg | ORAL_TABLET | Freq: Every day | ORAL | Status: DC

## 2015-08-13 ENCOUNTER — Other Ambulatory Visit: Payer: Self-pay | Admitting: Neurosurgery

## 2015-08-23 NOTE — Pre-Procedure Instructions (Signed)
Leanndra Dalo Balistreri  08/23/2015     Your procedure is scheduled on : Thursday September 05, 2015 at 7:30 AM.  Report to [facility] Admitting at 5:30 AM.  Call this number if you have problems the morning of surgery: [phone]    Remember:  Do not eat food or drink liquids after midnight.  Take these medicines the morning of surgery with A SIP OF WATER : Astelin nasal spray, Symbicort inhaler, Loratadine (Claritin) if needed   Stop taking any vitamins, herbal medications/supplements, NSAIDs, Ibuprofen, Advil, Motrin, Aleve, etc on Thursday June 29th   Bring CPAP mask the day of your surgery   Do not wear jewelry, make-up or nail polish.  Do not wear lotions, powders, or perfumes.    Do not shave 48 hours prior to surgery.    Do not bring valuables to the hospital.  [facility] is not responsible for any belongings or valuables.  Contacts, dentures or bridgework may not be worn into surgery.  Leave your suitcase in the car.  After surgery it may be brought to your room.  For patients admitted to the hospital, discharge time will be determined by your treatment team.  Patients discharged the day of surgery will not be allowed to drive home.   Name and phone number of your driver:    Special instructions:  Shower using CHG soap the night before and the morning of your surgery  Please read over the following fact sheets that you were given. Pain Booklet and MRSA Information

## 2015-08-26 ENCOUNTER — Encounter (HOSPITAL_COMMUNITY): Payer: Self-pay

## 2015-08-26 ENCOUNTER — Encounter (HOSPITAL_COMMUNITY)
Admission: RE | Admit: 2015-08-26 | Discharge: 2015-08-26 | Disposition: A | Discharge status: Home / Self Care | Payer: Medicare Other | Source: Ambulatory Visit | Attending: Neurosurgery | Admitting: Neurosurgery

## 2015-08-26 DIAGNOSIS — E785 Hyperlipidemia, unspecified: Secondary | ICD-10-CM | POA: Insufficient documentation

## 2015-08-26 DIAGNOSIS — M4806 Spinal stenosis, lumbar region: Secondary | ICD-10-CM | POA: Insufficient documentation

## 2015-08-26 DIAGNOSIS — Z0183 Encounter for blood typing: Secondary | ICD-10-CM | POA: Diagnosis not present

## 2015-08-26 DIAGNOSIS — Z7982 Long term (current) use of aspirin: Secondary | ICD-10-CM | POA: Insufficient documentation

## 2015-08-26 DIAGNOSIS — I10 Essential (primary) hypertension: Secondary | ICD-10-CM | POA: Insufficient documentation

## 2015-08-26 DIAGNOSIS — Z7951 Long term (current) use of inhaled steroids: Secondary | ICD-10-CM | POA: Diagnosis not present

## 2015-08-26 DIAGNOSIS — Z01818 Encounter for other preprocedural examination: Secondary | ICD-10-CM | POA: Insufficient documentation

## 2015-08-26 DIAGNOSIS — J45909 Unspecified asthma, uncomplicated: Secondary | ICD-10-CM | POA: Diagnosis not present

## 2015-08-26 DIAGNOSIS — Z01812 Encounter for preprocedural laboratory examination: Secondary | ICD-10-CM | POA: Insufficient documentation

## 2015-08-26 DIAGNOSIS — I447 Left bundle-branch block, unspecified: Secondary | ICD-10-CM | POA: Diagnosis not present

## 2015-08-26 DIAGNOSIS — Z79899 Other long term (current) drug therapy: Secondary | ICD-10-CM | POA: Insufficient documentation

## 2015-08-26 DIAGNOSIS — G4733 Obstructive sleep apnea (adult) (pediatric): Secondary | ICD-10-CM | POA: Diagnosis not present

## 2015-08-26 HISTORY — DX: Unspecified osteoarthritis, unspecified site: M19.90

## 2015-08-26 HISTORY — DX: Cardiac murmur, unspecified: R01.1

## 2015-08-26 HISTORY — DX: Hyperglycemia, unspecified: R73.9

## 2015-08-26 HISTORY — DX: Essential (primary) hypertension: I10

## 2015-08-26 LAB — BASIC METABOLIC PANEL
Anion gap: 7 (ref 5–15)
BUN: 14 mg/dL (ref 6–20)
CO2: 28 mmol/L (ref 22–32)
Calcium: 9.5 mg/dL (ref 8.9–10.3)
Chloride: 105 mmol/L (ref 101–111)
Creatinine, Ser: 0.73 mg/dL (ref 0.44–1.00)
GFR calc Af Amer: >60 mL/min (ref >60)
GFR calc non Af Amer: >60 mL/min (ref >60)
Glucose, Bld: 185 mg/dL — ABNORMAL HIGH (ref 65–99)
Potassium: 3.7 mmol/L (ref 3.5–5.1)
Sodium: 140 mmol/L (ref 135–145)

## 2015-08-26 LAB — TYPE AND SCREEN
ABO/RH(D): O POS
Antibody Screen: NEGATIVE

## 2015-08-26 LAB — CBC
HCT: 42.4 % (ref 36.0–46.0)
Hemoglobin: 14.2 g/dL (ref 12.0–15.0)
MCH: 30.9 pg (ref 26.0–34.0)
MCHC: 33.5 g/dL (ref 30.0–36.0)
MCV: 92.2 fL (ref 78.0–100.0)
Platelets: 189 10*3/uL (ref 150–400)
RBC: 4.6 MIL/uL (ref 3.87–5.11)
RDW: 12.9 % (ref 11.5–15.5)
WBC: 5.1 10*3/uL (ref 4.0–10.5)

## 2015-08-26 LAB — GLUCOSE, CAPILLARY: Glucose-Capillary: 209 mg/dL — ABNORMAL HIGH (ref 65–99)

## 2015-08-26 LAB — SURGICAL PCR SCREEN
MRSA, PCR: NEGATIVE
Staphylococcus aureus: NEGATIVE

## 2015-08-26 LAB — ABO/RH: ABO/RH(D): O POS

## 2015-08-26 NOTE — Progress Notes (Signed)
PCP is Anastasia Pall  Cardiologist is Loralie Champagne. Patient denied having any acute cardiac or pulmonary issues. Patient informed Nurse that she is scheduled to see Dr. Aundra Dubin tomorrow (08/27/15) for her yearly visit. Nurse instructed patient to inform Dr. Aundra Dubin that she is having a upcoming surgery on 09/05/15, and that a EKG would be done today while in PAT. Patient verbalized understanding.   Patient informed Nurse that she is a "borderline, diet controlled diabetic". CBG on arrival to PAT was 209 and patient informed Nurse that she consumed a bagel and drank some water. Patient stated "I ate a strawberry cobbler last night."   Will send chart to Anesthesia for review.

## 2015-08-27 ENCOUNTER — Ambulatory Visit (INDEPENDENT_AMBULATORY_CARE_PROVIDER_SITE_OTHER): Payer: Medicare Other | Admitting: Cardiology

## 2015-08-27 ENCOUNTER — Encounter: Payer: Self-pay | Admitting: Cardiology

## 2015-08-27 ENCOUNTER — Encounter (HOSPITAL_COMMUNITY): Payer: Self-pay

## 2015-08-27 VITALS — BP 138/66 | HR 84 | Ht 66.0 in | Wt 174.1 lb

## 2015-08-27 DIAGNOSIS — I447 Left bundle-branch block, unspecified: Secondary | ICD-10-CM | POA: Diagnosis not present

## 2015-08-27 DIAGNOSIS — I872 Venous insufficiency (chronic) (peripheral): Secondary | ICD-10-CM | POA: Diagnosis not present

## 2015-08-27 DIAGNOSIS — I6523 Occlusion and stenosis of bilateral carotid arteries: Secondary | ICD-10-CM

## 2015-08-27 DIAGNOSIS — I471 Supraventricular tachycardia: Secondary | ICD-10-CM

## 2015-08-27 LAB — HEMOGLOBIN A1C
Hgb A1c MFr Bld: 6.8 % — ABNORMAL HIGH (ref 4.8–5.6)
Mean Plasma Glucose: 148 mg/dL

## 2015-08-27 NOTE — Patient Instructions (Addendum)
Medication Instructions:  Your physician recommends that you continue on your current medications as directed. Please refer to the Current Medication list given to you today.   Labwork: None ordered   Testing/Procedures: Your physician has requested that you have a carotid duplex. This test is an ultrasound of the carotid arteries in your neck. It looks at blood flow through these arteries that supply the brain with blood. Allow one hour for this exam. There are no restrictions or special instructions.    Follow-Up: Your physician wants you to follow-up in: 12 months with Dr Kendall Flack will receive a reminder letter in the mail two months in advance. If you don't receive a letter, please call our office to schedule the follow-up appointment.   Any Other Special Instructions Will Be Listed Below (If Applicable). Dr Aundra Dubin has cleared for back surgery with Dr Rita Ohara      If you need a refill on your cardiac medications before your next appointment, please call your pharmacy.

## 2015-08-27 NOTE — Progress Notes (Addendum)
Anesthesia Chart Review: Patient is a 70 year old female scheduled for L3-4, L4-5 fusion on 09/05/15 by Dr. Sherwood Gambler.  History includes non-smoker, HLD, asthma, atrial flutter s/p ablation 01/27/13, murmur, exertional dyspnea, HTN, varicose veins, diverticulosis, OSA (on CPAP), bladder tumor s/p cystoscopy/fulguration '04, hysterectomy '90's, back surgery '15, hyperglycemia/"borderline" DM2.   PCP is Dr. Anastasia Pall. Cardiologist is Dr. Loralie Champagne. Office visit today.  EP Cardiologist is Dr. Cristopher Peru.  Meds include Xanax, ASA (on hold), Lipitor, Astelin, Symbicort, Lasix, Neurontin, Claritin, losartan, KCL.  PAT Vitals: BP 158/67, HR 71, T 36.1C, RR 20, O2 sat 97%. CBG 209.  08/26/15 EKG: NSR, left BBB (old).  10/11/12 Echo: Study Conclusions - Left ventricle: The cavity size was normal. Wall thickness was increased in a pattern of mild LVH. Systolic function was normal. The estimated ejection fraction was in the range of 55% to 65%. Wall motion was normal; there were no regional wall motion abnormalities. Doppler parameters are consistent with abnormal left ventricular relaxation (grade 1 diastolic dysfunction). - Mitral valve: Mild regurgitation. - Atrial septum: No defect or patent foramen ovale was identified.  04/25/12 Nuclear stress test: Overall Impression: Normal stress nuclear study.Baseline ECG with LBBB. LV Ejection Fraction: 62%. LV Wall Motion: NL LV Function; NL Wall Motion.  02/13/14 Carotid U/S: Intimal thickening BCCA's. Heterogenous plaque on the left. 1-39% BICA stenosis. Normal SCAs bilaterally. Patent vertebral arteries with antegrade flow.  Preoperative labs noted. Cr 0.73. CBC WNL. T&S done. A1c 6.8. She will need a fasting CBG on arrival.   Follow-up cardiology notes once available.  George Hugh [facility]/Anesthesiology Phone [phone] 08/27/2015 2:32 PM  Addendum: Patient was seen by Dr. Aundra Dubin on  08/27/15. She was symptomatically stable except for back pain. He wrote, "I think she can proceed to surgery without further cardiac workup." One year cardiology follow-up planned.  I no acute changes then I anticipate that she can proceed to OR.  George Hugh [facility] Short Stay Center/Anesthesiology Phone [phone] 08/29/2015 3:21 PM

## 2015-08-29 NOTE — Progress Notes (Signed)
Patient ID: Marcia Norris, female   DOB: 09-28-1945, 70 y.o.   MRN: [id number] PCP: Dr. Melford Aase  70 yo with history of OSA, mild carotid stenosis, AVNRT/AVRT ablation, and chronic LBBB presents for cardiology followup.  She ended up having AVNRT and AVRT ablation in 12/14.  No tachypalpitations.  No chest pain, no exertional dyspnea.  Main problem currently is low back pain with sciatica.  She plans to have a lumbar fusion with Dr Sherwood Gambler. She is able to climb a flight of stairs (though it hurts her back).    Labs (5/15): K 3.7, creatinine 0.7  Labs (6/17): K 3.7, creatinine 0.73  ECG: NSR, LBBB  PMH: 1. OSA: On CPAP 2. Hyperlipidemia 3. Asthma 4. Chronic LBBB: adenosine Myoview in 2/14 showed EF 62%, no ischemia or infarction.  Echo (8/14) with EF 55-60%, mild MR.   5. Carotid stenosis: Carotid dopplers showed 0-39% RICA stenosis in 12/12. Carotids (12/14) with bilateral 1-39% ICA stenosis. Carotids (12/15) with mild stenosis.  6. AVNRT and AVRT s/p RFCA in 12/14.  7. HTN 8. Varicose veins 9. Peripheral neuropathy: Gabapentin 10.  Low back pain/sciatica  SH: Married, nonsmoker, lives in [place], retired, 2 kids.   FH: Father with CVA, CHF, ESRD.  ROS: All systems reviewed and negative except as per HPI.    Current Outpatient Prescriptions  Medication Sig Dispense Refill  . albuterol (PROAIR HFA) 108 (90 Base) MCG/ACT inhaler Inhale 1 Inhaler into the lungs daily as needed. For wheezing and SOB    . ALPRAZolam (XANAX) 0.5 MG tablet Take 0.5 mg by mouth as needed for anxiety (1-2 times a week for sleep).    Marland Kitchen aspirin 81 MG tablet Take 81 mg by mouth every morning.     [name]      Marland Kitchen azelastine (ASTELIN) 137 MCG/SPRAY nasal spray Place 1 spray into the nose daily as needed for rhinitis.     . budesonide-formoterol (SYMBICORT) 160-4.5 MCG/ACT inhaler Inhale 2 puffs into the lungs daily as needed (for allergies and SOB).     . Calcium Carbonate-Vitamin D 600-400 MG-UNIT tablet Take 1 tablet by mouth daily.    . Cholecalciferol (VITAMIN D3) 5000 units CHEW Chew 1 each by mouth daily.    . furosemide (LASIX) 20 MG tablet Take 20 mg by mouth daily as needed for edema. Every other day    . gabapentin (NEURONTIN) 100 MG capsule Take 100-300 mg by mouth at bedtime.    Marland Kitchen loratadine (CLARITIN) 10 MG tablet Take 10 mg by mouth daily as needed for allergies.     Marland Kitchen losartan (COZAAR) 25 MG tablet Take 1 tablet (25 mg total) by mouth daily. 90 tablet 3  . Multiple Vitamins-Minerals (ONE-A-DAY 50 PLUS PO) Take 1 tablet by mouth daily.      . potassium chloride SA (K-DUR,KLOR-CON) 20 MEQ tablet Take 20 mEq by mouth daily as needed (For when you take Lasix).     No current facility-administered medications for this visit.    BP 138/66 mmHg  Pulse 84  Ht 5\' 6"  (1.676 m)  Wt 174 lb 1.9 oz (78.98 kg)  BMI 28.12 kg/m2  SpO2 92% General: NAD Neck: No JVD, no thyromegaly or thyroid nodule.  Lungs: Clear to auscultation bilaterally with normal respiratory effort. CV: Nondisplaced PMI.  Heart regular S1/S2, paradoxical split S2, no S3/S4.  1/6 SEM RUSB.  Trace ankle edema.  Right carotid bruit.  Normal pedal pulses.  Abdomen: Soft, nontender, no hepatosplenomegaly, no distention.  Skin: Intact without lesions or rashes.  Neurologic: Alert and oriented x 3.  Psych: Normal affect. Extremities: No clubbing or cyanosis. Bilateral venous varicosities.   Assessment/Plan: 1. LBBB: Chronic.  No evidence for significant CAD by recent adenosine myoview or by symptoms. EF preserved on echo in 8/14.  2. Carotid bruit: Mild carotid stenosis.  Can repeat dopplers in 12/17.   3. H/o AVNRT/AVRT: No symptomatic recurrence since ablation.   4. Preoperative evaluation: Needs lumbar fusion.  She is symptomatically stable except for the back pain.  I think she can proceed to surgery without further cardiac workup.   Followup in 1 year.    Loralie Champagne 08/29/2015

## 2015-09-04 MED ORDER — CEFAZOLIN SODIUM-DEXTROSE 2-4 GM/100ML-% IV SOLN
2.0000 g | INTRAVENOUS | Status: AC
  Administered 2015-09-05 (×2): 2 g via INTRAVENOUS
  Filled 2015-09-04: qty 100

## 2015-09-05 ENCOUNTER — Inpatient Hospital Stay (HOSPITAL_COMMUNITY)
Admission: RE | Admit: 2015-09-05 | Discharge: 2015-09-06 | DRG: 460 | Disposition: A | Discharge status: Home / Self Care | Payer: Medicare Other | Source: Ambulatory Visit | Attending: Neurosurgery | Admitting: Neurosurgery

## 2015-09-05 ENCOUNTER — Inpatient Hospital Stay (HOSPITAL_COMMUNITY): Payer: Medicare Other | Admitting: Anesthesiology

## 2015-09-05 ENCOUNTER — Inpatient Hospital Stay (HOSPITAL_COMMUNITY): Payer: Medicare Other

## 2015-09-05 ENCOUNTER — Encounter (HOSPITAL_COMMUNITY)
Admission: RE | Disposition: A | Discharge status: Home / Self Care | Payer: Self-pay | Source: Ambulatory Visit | Attending: Neurosurgery

## 2015-09-05 ENCOUNTER — Encounter (HOSPITAL_COMMUNITY): Payer: Self-pay | Admitting: Certified Registered Nurse Anesthetist

## 2015-09-05 ENCOUNTER — Inpatient Hospital Stay (HOSPITAL_COMMUNITY): Payer: Medicare Other | Admitting: Emergency Medicine

## 2015-09-05 DIAGNOSIS — M4806 Spinal stenosis, lumbar region: Principal | ICD-10-CM | POA: Diagnosis present

## 2015-09-05 DIAGNOSIS — E785 Hyperlipidemia, unspecified: Secondary | ICD-10-CM | POA: Diagnosis present

## 2015-09-05 DIAGNOSIS — Z9841 Cataract extraction status, right eye: Secondary | ICD-10-CM

## 2015-09-05 DIAGNOSIS — Z90710 Acquired absence of both cervix and uterus: Secondary | ICD-10-CM

## 2015-09-05 DIAGNOSIS — M4316 Spondylolisthesis, lumbar region: Secondary | ICD-10-CM | POA: Diagnosis present

## 2015-09-05 DIAGNOSIS — M47816 Spondylosis without myelopathy or radiculopathy, lumbar region: Secondary | ICD-10-CM | POA: Diagnosis present

## 2015-09-05 DIAGNOSIS — Z79899 Other long term (current) drug therapy: Secondary | ICD-10-CM

## 2015-09-05 DIAGNOSIS — F411 Generalized anxiety disorder: Secondary | ICD-10-CM | POA: Diagnosis present

## 2015-09-05 DIAGNOSIS — Z885 Allergy status to narcotic agent status: Secondary | ICD-10-CM

## 2015-09-05 DIAGNOSIS — Z961 Presence of intraocular lens: Secondary | ICD-10-CM | POA: Diagnosis present

## 2015-09-05 DIAGNOSIS — Z7982 Long term (current) use of aspirin: Secondary | ICD-10-CM

## 2015-09-05 DIAGNOSIS — Z9842 Cataract extraction status, left eye: Secondary | ICD-10-CM

## 2015-09-05 DIAGNOSIS — J45909 Unspecified asthma, uncomplicated: Secondary | ICD-10-CM | POA: Diagnosis present

## 2015-09-05 DIAGNOSIS — M5136 Other intervertebral disc degeneration, lumbar region: Secondary | ICD-10-CM | POA: Diagnosis present

## 2015-09-05 DIAGNOSIS — I119 Hypertensive heart disease without heart failure: Secondary | ICD-10-CM | POA: Diagnosis present

## 2015-09-05 DIAGNOSIS — Z419 Encounter for procedure for purposes other than remedying health state, unspecified: Secondary | ICD-10-CM

## 2015-09-05 DIAGNOSIS — M48062 Spinal stenosis, lumbar region with neurogenic claudication: Secondary | ICD-10-CM | POA: Diagnosis present

## 2015-09-05 HISTORY — DX: Type 2 diabetes mellitus without complications: E11.9

## 2015-09-05 HISTORY — PX: POSTERIOR FUSION LUMBAR SPINE: SUR632

## 2015-09-05 LAB — GLUCOSE, CAPILLARY
Glucose-Capillary: 163 mg/dL — ABNORMAL HIGH (ref 65–99)
Glucose-Capillary: 138 mg/dL — ABNORMAL HIGH (ref 65–99)
Glucose-Capillary: 172 mg/dL — ABNORMAL HIGH (ref 65–99)

## 2015-09-05 SURGERY — POSTERIOR LUMBAR FUSION 2 LEVEL
Anesthesia: General | Site: Back

## 2015-09-05 MED ORDER — THROMBIN 5000 UNITS EX SOLR
CUTANEOUS | Status: DC | PRN
  Administered 2015-09-05: 5000 [IU] via TOPICAL

## 2015-09-05 MED ORDER — BISACODYL 10 MG RE SUPP: 10.0000 mg | Freq: Every day | RECTAL | Status: DC | PRN

## 2015-09-05 MED ORDER — KETOROLAC TROMETHAMINE 30 MG/ML IJ SOLN
15.0000 mg | Freq: Four times a day (QID) | INTRAMUSCULAR | Status: DC
  Administered 2015-09-05 – 2015-09-06 (×4): 15 mg via INTRAVENOUS
  Filled 2015-09-05 (×3): qty 1

## 2015-09-05 MED ORDER — LIDOCAINE-EPINEPHRINE 1 %-1:100000 IJ SOLN
INTRAMUSCULAR | Status: DC | PRN
  Administered 2015-09-05: 15 mL

## 2015-09-05 MED ORDER — ACETAMINOPHEN 10 MG/ML IV SOLN
INTRAVENOUS | Status: AC
  Administered 2015-09-05: 1000 mg via INTRAVENOUS
  Filled 2015-09-05: qty 100

## 2015-09-05 MED ORDER — BACITRACIN 50000 UNITS IM SOLR
INTRAMUSCULAR | Status: DC | PRN
  Administered 2015-09-05: 500 mL

## 2015-09-05 MED ORDER — FENTANYL CITRATE (PF) 100 MCG/2ML IJ SOLN
INTRAMUSCULAR | Status: DC | PRN
  Administered 2015-09-05 (×4): 50 ug via INTRAVENOUS
  Administered 2015-09-05: 100 ug via INTRAVENOUS

## 2015-09-05 MED ORDER — ONDANSETRON HCL 4 MG/2ML IJ SOLN
INTRAMUSCULAR | Status: DC | PRN
  Administered 2015-09-05: 4 mg via INTRAVENOUS

## 2015-09-05 MED ORDER — ACETAMINOPHEN 325 MG PO TABS
650.0000 mg | ORAL_TABLET | ORAL | Status: DC | PRN
  Administered 2015-09-06 (×2): 650 mg via ORAL
  Filled 2015-09-05 (×2): qty 2

## 2015-09-05 MED ORDER — LIDOCAINE 2% (20 MG/ML) 5 ML SYRINGE
INTRAMUSCULAR | Status: AC
  Filled 2015-09-05: qty 5

## 2015-09-05 MED ORDER — BUPIVACAINE HCL (PF) 0.5 % IJ SOLN
INTRAMUSCULAR | Status: DC | PRN
  Administered 2015-09-05: 15 mL

## 2015-09-05 MED ORDER — LACTATED RINGERS IV SOLN
INTRAVENOUS | Status: DC | PRN
  Administered 2015-09-05: 10:00:00 via INTRAVENOUS

## 2015-09-05 MED ORDER — ONDANSETRON HCL 4 MG/2ML IJ SOLN: 4.0000 mg | Freq: Four times a day (QID) | INTRAMUSCULAR | Status: DC | PRN

## 2015-09-05 MED ORDER — PROPOFOL 10 MG/ML IV BOLUS
INTRAVENOUS | Status: DC | PRN
Start: 2015-09-05 — End: 2015-09-05
  Administered 2015-09-05: 130 mg via INTRAVENOUS

## 2015-09-05 MED ORDER — PHENYLEPHRINE HCL 10 MG/ML IJ SOLN
INTRAMUSCULAR | Status: DC | PRN
  Administered 2015-09-05: 80 ug via INTRAVENOUS
  Administered 2015-09-05 (×2): 120 ug via INTRAVENOUS

## 2015-09-05 MED ORDER — PHENYLEPHRINE HCL 10 MG/ML IJ SOLN
10.0000 mg | INTRAVENOUS | Status: DC | PRN
  Administered 2015-09-05: 15 ug/min via INTRAVENOUS

## 2015-09-05 MED ORDER — HYDROXYZINE HCL 50 MG/ML IM SOLN
50.0000 mg | INTRAMUSCULAR | Status: DC | PRN
  Administered 2015-09-05 – 2015-09-06 (×2): 50 mg via INTRAMUSCULAR
  Filled 2015-09-05 (×2): qty 1

## 2015-09-05 MED ORDER — PHENOL 1.4 % MT LIQD: 1.0000 | OROMUCOSAL | Status: DC | PRN

## 2015-09-05 MED ORDER — SODIUM CHLORIDE 0.9 % IR SOLN
Status: DC | PRN
  Administered 2015-09-05 (×4): 500 mL

## 2015-09-05 MED ORDER — SODIUM CHLORIDE 0.9 % IV SOLN
INTRAVENOUS | Status: DC | PRN
  Administered 2015-09-05: 15:00:00 via INTRAVENOUS

## 2015-09-05 MED ORDER — EPHEDRINE SULFATE 50 MG/ML IJ SOLN
INTRAMUSCULAR | Status: DC | PRN
  Administered 2015-09-05: 5 mg via INTRAVENOUS

## 2015-09-05 MED ORDER — SODIUM CHLORIDE 0.9% FLUSH
3.0000 mL | Freq: Two times a day (BID) | INTRAVENOUS | Status: DC
  Administered 2015-09-05 – 2015-09-06 (×2): 3 mL via INTRAVENOUS

## 2015-09-05 MED ORDER — PROPOFOL 10 MG/ML IV BOLUS
INTRAVENOUS | Status: AC
  Filled 2015-09-05: qty 20

## 2015-09-05 MED ORDER — ONDANSETRON HCL 4 MG PO TABS: 4.0000 mg | ORAL_TABLET | Freq: Four times a day (QID) | ORAL | Status: DC | PRN

## 2015-09-05 MED ORDER — LACTATED RINGERS IV SOLN
INTRAVENOUS | Status: DC
  Administered 2015-09-05 (×2): via INTRAVENOUS

## 2015-09-05 MED ORDER — 0.9 % SODIUM CHLORIDE (POUR BTL) OPTIME
TOPICAL | Status: DC | PRN
  Administered 2015-09-05 (×2): 1000 mL

## 2015-09-05 MED ORDER — THROMBIN 20000 UNITS EX SOLR
CUTANEOUS | Status: DC | PRN
  Administered 2015-09-05: 14:00:00 via TOPICAL

## 2015-09-05 MED ORDER — NEOSTIGMINE METHYLSULFATE 10 MG/10ML IV SOLN
INTRAVENOUS | Status: DC | PRN
  Administered 2015-09-05: 4 mg via INTRAVENOUS

## 2015-09-05 MED ORDER — ZOLPIDEM TARTRATE 5 MG PO TABS: 5.0000 mg | ORAL_TABLET | Freq: Every evening | ORAL | Status: DC | PRN

## 2015-09-05 MED ORDER — ROCURONIUM BROMIDE 50 MG/5ML IV SOLN
INTRAVENOUS | Status: AC
  Filled 2015-09-05: qty 1

## 2015-09-05 MED ORDER — ATORVASTATIN CALCIUM 40 MG PO TABS
40.0000 mg | ORAL_TABLET | Freq: Every day | ORAL | Status: DC
  Filled 2015-09-05 (×2): qty 1

## 2015-09-05 MED ORDER — HYDROXYZINE HCL 25 MG PO TABS: 50.0000 mg | ORAL_TABLET | ORAL | Status: DC | PRN

## 2015-09-05 MED ORDER — SODIUM CHLORIDE 0.9% FLUSH: 3.0000 mL | INTRAVENOUS | Status: DC | PRN

## 2015-09-05 MED ORDER — ACETAMINOPHEN 650 MG RE SUPP: 650.0000 mg | RECTAL | Status: DC | PRN

## 2015-09-05 MED ORDER — FENTANYL CITRATE (PF) 250 MCG/5ML IJ SOLN
INTRAMUSCULAR | Status: AC
  Filled 2015-09-05: qty 5

## 2015-09-05 MED ORDER — ROCURONIUM BROMIDE 100 MG/10ML IV SOLN
INTRAVENOUS | Status: DC | PRN
  Administered 2015-09-05: 20 mg via INTRAVENOUS
  Administered 2015-09-05: 50 mg via INTRAVENOUS
  Administered 2015-09-05: 20 mg via INTRAVENOUS
  Administered 2015-09-05: 10 mg via INTRAVENOUS

## 2015-09-05 MED ORDER — LOSARTAN POTASSIUM 25 MG PO TABS
25.0000 mg | ORAL_TABLET | Freq: Every day | ORAL | Status: DC
  Filled 2015-09-05 (×2): qty 1

## 2015-09-05 MED ORDER — CYCLOBENZAPRINE HCL 10 MG PO TABS
10.0000 mg | ORAL_TABLET | Freq: Three times a day (TID) | ORAL | Status: DC | PRN
  Administered 2015-09-05 – 2015-09-06 (×2): 10 mg via ORAL
  Filled 2015-09-05 (×2): qty 1

## 2015-09-05 MED ORDER — GLYCOPYRROLATE 0.2 MG/ML IJ SOLN
INTRAMUSCULAR | Status: DC | PRN
  Administered 2015-09-05: 0.6 mg via INTRAVENOUS

## 2015-09-05 MED ORDER — POTASSIUM CHLORIDE IN NACL 20-0.9 MEQ/L-% IV SOLN
INTRAVENOUS | Status: DC
  Filled 2015-09-05 (×4): qty 1000

## 2015-09-05 MED ORDER — KETOROLAC TROMETHAMINE 30 MG/ML IJ SOLN
15.0000 mg | Freq: Once | INTRAMUSCULAR | Status: AC
  Administered 2015-09-05: 15 mg via INTRAVENOUS

## 2015-09-05 MED ORDER — GABAPENTIN 100 MG PO CAPS
200.0000 mg | ORAL_CAPSULE | Freq: Every day | ORAL | Status: DC
  Administered 2015-09-05: 200 mg via ORAL
  Filled 2015-09-05: qty 2

## 2015-09-05 MED ORDER — HEMOSTATIC AGENTS (NO CHARGE) OPTIME
TOPICAL | Status: DC | PRN
  Administered 2015-09-05: 1 via TOPICAL

## 2015-09-05 MED ORDER — MORPHINE SULFATE (PF) 4 MG/ML IV SOLN: 4.0000 mg | INTRAVENOUS | Status: DC | PRN

## 2015-09-05 MED ORDER — MAGNESIUM HYDROXIDE 400 MG/5ML PO SUSP: 30.0000 mL | Freq: Every day | ORAL | Status: DC | PRN

## 2015-09-05 MED ORDER — LIDOCAINE HCL (CARDIAC) 20 MG/ML IV SOLN
INTRAVENOUS | Status: DC | PRN
  Administered 2015-09-05: 80 mg via INTRAVENOUS

## 2015-09-05 MED ORDER — ALUM & MAG HYDROXIDE-SIMETH 200-200-20 MG/5ML PO SUSP: 30.0000 mL | Freq: Four times a day (QID) | ORAL | Status: DC | PRN

## 2015-09-05 MED ORDER — OXYCODONE-ACETAMINOPHEN 5-325 MG PO TABS
1.0000 | ORAL_TABLET | ORAL | Status: DC | PRN
  Filled 2015-09-05: qty 1

## 2015-09-05 MED ORDER — ALPRAZOLAM 0.5 MG PO TABS: 0.5000 mg | ORAL_TABLET | Freq: Every evening | ORAL | Status: DC | PRN

## 2015-09-05 MED ORDER — THROMBIN 20000 UNITS EX KIT
PACK | CUTANEOUS | Status: DC | PRN
Start: 2015-09-05 — End: 2015-09-05
  Administered 2015-09-05: 20000 [IU] via TOPICAL

## 2015-09-05 MED ORDER — MENTHOL 3 MG MT LOZG: 1.0000 | LOZENGE | OROMUCOSAL | Status: DC | PRN

## 2015-09-05 MED ORDER — KETOROLAC TROMETHAMINE 30 MG/ML IJ SOLN
INTRAMUSCULAR | Status: AC
  Filled 2015-09-05: qty 1

## 2015-09-05 MED ORDER — THROMBIN 5000 UNITS EX SOLR
CUTANEOUS | Status: DC | PRN
  Administered 2015-09-05: 5 mL via TOPICAL

## 2015-09-05 MED ORDER — GLYCOPYRROLATE 0.2 MG/ML IV SOSY
PREFILLED_SYRINGE | INTRAVENOUS | Status: AC
  Filled 2015-09-05: qty 3

## 2015-09-05 MED ORDER — FENTANYL CITRATE (PF) 100 MCG/2ML IJ SOLN
INTRAMUSCULAR | Status: AC
  Filled 2015-09-05: qty 2

## 2015-09-05 MED ORDER — HYDROCODONE-ACETAMINOPHEN 5-325 MG PO TABS
1.0000 | ORAL_TABLET | ORAL | Status: DC | PRN
  Administered 2015-09-06: 1 via ORAL
  Filled 2015-09-05: qty 1

## 2015-09-05 MED ORDER — FENTANYL CITRATE (PF) 100 MCG/2ML IJ SOLN
25.0000 ug | INTRAMUSCULAR | Status: DC | PRN
  Administered 2015-09-05: 25 ug via INTRAVENOUS

## 2015-09-05 MED ORDER — SUCCINYLCHOLINE CHLORIDE 200 MG/10ML IV SOSY
PREFILLED_SYRINGE | INTRAVENOUS | Status: AC
  Filled 2015-09-05: qty 10

## 2015-09-05 MED ORDER — NEOSTIGMINE METHYLSULFATE 5 MG/5ML IV SOSY
PREFILLED_SYRINGE | INTRAVENOUS | Status: AC
  Filled 2015-09-05: qty 5

## 2015-09-05 MED FILL — Sodium Chloride IV Soln 0.9%: INTRAVENOUS | Qty: 1000 | Status: AC

## 2015-09-05 MED FILL — Heparin Sodium (Porcine) Inj 1000 Unit/ML: INTRAMUSCULAR | Qty: 30 | Status: AC

## 2015-09-05 SURGICAL SUPPLY — 74 items
BAG DECANTER FOR FLEXI CONT (MISCELLANEOUS) ×4 IMPLANT
BENZOIN TINCTURE PRP APPL 2/3 (GAUZE/BANDAGES/DRESSINGS) IMPLANT
BLADE CLIPPER SURG (BLADE) IMPLANT
BLADE STRYKER (BLADE) ×2 IMPLANT
BRUSH SCRUB EZ PLAIN DRY (MISCELLANEOUS) ×2 IMPLANT
BUR ACRON 5.0MM COATED (BURR) ×4 IMPLANT
BUR MATCHSTICK NEURO 3.0 LAGG (BURR) ×2 IMPLANT
CANISTER SUCT 3000ML PPV (MISCELLANEOUS) ×2 IMPLANT
CAP LCK SPNE (Orthopedic Implant) ×6 IMPLANT
CAP LOCK SPINE RADIUS (Orthopedic Implant) ×6 IMPLANT
CAP LOCKING (Orthopedic Implant) ×6 IMPLANT
CONT SPEC 4OZ CLIKSEAL STRL BL (MISCELLANEOUS) ×2 IMPLANT
COVER BACK TABLE 60X90IN (DRAPES) ×2 IMPLANT
CROSSLINK MEDIUM (Orthopedic Implant) ×2 IMPLANT
DERMABOND ADVANCED (GAUZE/BANDAGES/DRESSINGS) ×1
DERMABOND ADVANCED .7 DNX12 (GAUZE/BANDAGES/DRESSINGS) ×1 IMPLANT
DRAPE C-ARM 42X72 X-RAY (DRAPES) ×4 IMPLANT
DRAPE LAPAROTOMY 100X72X124 (DRAPES) ×2 IMPLANT
DRAPE POUCH INSTRU U-SHP 10X18 (DRAPES) ×2 IMPLANT
DRAPE PROXIMA HALF (DRAPES) IMPLANT
DRSG EMULSION OIL 3X3 NADH (GAUZE/BANDAGES/DRESSINGS) IMPLANT
ELECT REM PT RETURN 9FT ADLT (ELECTROSURGICAL) ×2
ELECTRODE REM PT RTRN 9FT ADLT (ELECTROSURGICAL) ×1 IMPLANT
GAUZE SPONGE 4X4 12PLY STRL (GAUZE/BANDAGES/DRESSINGS) ×2 IMPLANT
GAUZE SPONGE 4X4 16PLY XRAY LF (GAUZE/BANDAGES/DRESSINGS) IMPLANT
GLOVE BIOGEL M 8.0 STRL (GLOVE) ×2 IMPLANT
GLOVE BIOGEL PI IND STRL 8 (GLOVE) ×5 IMPLANT
GLOVE BIOGEL PI INDICATOR 8 (GLOVE) ×5
GLOVE ECLIPSE 7.5 STRL STRAW (GLOVE) ×10 IMPLANT
GOWN STRL REUS W/ TWL LRG LVL3 (GOWN DISPOSABLE) IMPLANT
GOWN STRL REUS W/ TWL XL LVL3 (GOWN DISPOSABLE) ×3 IMPLANT
GOWN STRL REUS W/TWL 2XL LVL3 (GOWN DISPOSABLE) ×4 IMPLANT
GOWN STRL REUS W/TWL LRG LVL3 (GOWN DISPOSABLE)
GOWN STRL REUS W/TWL XL LVL3 (GOWN DISPOSABLE) ×3
HEMOSTAT POWDER KIT SURGIFOAM (HEMOSTASIS) ×2 IMPLANT
KIT BASIN OR (CUSTOM PROCEDURE TRAY) ×2 IMPLANT
KIT INFUSE MEDIUM (Orthopedic Implant) ×2 IMPLANT
KIT ROOM TURNOVER OR (KITS) ×2 IMPLANT
MILL MEDIUM DISP (BLADE) ×2 IMPLANT
NEEDLE BONE MARROW 8GAX6 (NEEDLE) ×2 IMPLANT
NEEDLE SPNL 18GX3.5 QUINCKE PK (NEEDLE) ×2 IMPLANT
NEEDLE SPNL 22GX3.5 QUINCKE BK (NEEDLE) ×2 IMPLANT
NS IRRIG 1000ML POUR BTL (IV SOLUTION) ×4 IMPLANT
PACK LAMINECTOMY NEURO (CUSTOM PROCEDURE TRAY) ×2 IMPLANT
PAD ARMBOARD 7.5X6 YLW CONV (MISCELLANEOUS) ×6 IMPLANT
PATTIES SURGICAL .5 X.5 (GAUZE/BANDAGES/DRESSINGS) ×2 IMPLANT
PATTIES SURGICAL .5 X1 (DISPOSABLE) IMPLANT
PATTIES SURGICAL 1X1 (DISPOSABLE) ×2 IMPLANT
PEEK PLIF AVS 10X25X4 (Peek) ×4 IMPLANT
PEEK PLIF AVS 9X25X4 (Peek) ×4 IMPLANT
ROD 5.5X60MM GREEN (Rod) ×4 IMPLANT
SCREW 5.75X40M (Screw) ×4 IMPLANT
SCREW 5.75X45MM (Screw) ×8 IMPLANT
SPONGE LAP 4X18 X RAY DECT (DISPOSABLE) IMPLANT
SPONGE NEURO XRAY DETECT 1X3 (DISPOSABLE) ×4 IMPLANT
SPONGE SURGIFOAM ABS GEL 100 (HEMOSTASIS) ×4 IMPLANT
STAPLER SKIN PROX WIDE 3.9 (STAPLE) IMPLANT
STRIP BIOACTIVE VITOSS 25X100X (Neuro Prosthesis/Implant) ×4 IMPLANT
STRIP CLOSURE SKIN 1/2X4 (GAUZE/BANDAGES/DRESSINGS) IMPLANT
SUT PROLENE 6 0 BV (SUTURE) IMPLANT
SUT VIC AB 0 CT1 18XCR BRD8 (SUTURE) ×1 IMPLANT
SUT VIC AB 0 CT1 8-18 (SUTURE) ×1
SUT VIC AB 1 CT1 18XBRD ANBCTR (SUTURE) ×2 IMPLANT
SUT VIC AB 1 CT1 8-18 (SUTURE) ×2
SUT VIC AB 2-0 CP2 18 (SUTURE) ×4 IMPLANT
SYR 3ML LL SCALE MARK (SYRINGE) ×6 IMPLANT
SYR 5ML LL (SYRINGE) IMPLANT
SYR CONTROL 10ML LL (SYRINGE) ×2 IMPLANT
TAPE CLOTH SURG 4X10 WHT LF (GAUZE/BANDAGES/DRESSINGS) ×2 IMPLANT
TOWEL OR 17X24 6PK STRL BLUE (TOWEL DISPOSABLE) ×2 IMPLANT
TOWEL OR 17X26 10 PK STRL BLUE (TOWEL DISPOSABLE) ×2 IMPLANT
TRAP SPECIMEN MUCOUS 40CC (MISCELLANEOUS) IMPLANT
TRAY FOLEY W/METER SILVER 16FR (SET/KITS/TRAYS/PACK) ×2 IMPLANT
WATER STERILE IRR 1000ML POUR (IV SOLUTION) ×2 IMPLANT

## 2015-09-05 NOTE — H&P (Signed)
Subjective: Patient is a 70 y.o. right-handed white female who is admitted for treatment of multilevel multifactorial lumbar stenosis.  Patient has been having increasing low back and left lumbar radicular pain. It is associated with numbness and tingling. Examination showed good strength and sensation. X-rays showed a grade 1 dynamic degenerative spondylolisthesis at L4-5. MRI scan shows marked to severe multifactorial lumbar stenosis at L3-4 and very severe multifactorial lumbar stenosis at L4-5. Patient admitted now for lumbar decompression and stabilization, specifically a L3-L5 decompressive lumbar laminectomy, and L3-4 and L4-5 posterior lumbar interbody arthrodesis with interbody implants and bone graft, and the L3-L5 posterior lateral arthrodesis with posterior instrumentation and bone graft.    Patient Active Problem List   Diagnosis Date Noted  . Dependent edema 05/09/2014  . Varicose veins of lower extremities with other complications 0000000  . Accumulation of fluid in tissues 08/02/2013  . RLQ abdominal pain 03/17/2013  . Paroxysmal supraventricular tachycardia ([place]) 01/27/2013  . Depression, major, recurrent, moderate ([place]) 01/10/2013  . Atrial flutter with rapid ventricular response ([place]) 12/22/2012  . LBBB (left bundle branch block) 10/22/2012  . Burning sensation of the foot 10/06/2012  . Arm pain 04/22/2012  . Mitral regurgitation 04/07/2012  . OSA (obstructive sleep apnea) 02/16/2012  . Cardiac murmur 04/24/2011  . Intermittent tremor 04/24/2011  . Urge incontinence 04/24/2011  . Murmur 03/30/2011  . Tremor 03/30/2011  . LBP (low back pain) 06/17/2010  . Bone/cartilage disorder 02/17/2010  . Elevated fasting blood sugar 02/17/2010  . Essential (primary) hypertension 02/17/2010  . Avitaminosis D 02/17/2010  . Carotid stenosis 12/19/2009  . ABNORMAL EKG 12/19/2009  . Abnormal ECG 12/19/2009  . Clinical depression 11/15/2009  . Anxiety, generalized 11/15/2009  .  Cannot sleep 04/26/2009  . CHEST PAIN-UNSPECIFIED 01/17/2009  . PALPITATIONS, RECURRENT 08/01/2008  . SHORTNESS OF BREATH 08/01/2008  . BLADDER CANCER 05/28/2008  . Hypertensive heart disease 05/28/2008  . Chronic venous insufficiency 03/21/2008  . Colon, diverticulosis 09/05/2007  . Cardiovascular symptoms 09/05/2007  . HYPERLIPIDEMIA 01/04/2007  . ALLERGIC RHINITIS 01/04/2007  . ASTHMA 01/04/2007  . COUGH, CHRONIC 01/04/2007   Past Medical History  Diagnosis Date  . Hyperlipidemia   . Vitamin D deficiency   . Asthma   . Seasonal allergies   . Atrial flutter ([place])   . Exertional shortness of breath   . Diverticulosis   . Varicose veins   . Hypertension   . Heart murmur   . Arthritis   . OSA on CPAP 02/16/2012    wears CPAP machine at night, but takes it off during the night; pt stated "I was told I have mild sleep apnea"  . Hyperglycemia   . Diabetes mellitus without complication (HCC)     borderline, diet controlled, no meds    Past Surgical History  Procedure Laterality Date  . Atrial flutter ablation  01/27/2013  . Abdominal hysterectomy  1990's    partial  . Transurethral resection of bladder tumor  ~ 2004  . Cystoscopy      "multiple after bladder tumor removed; released in 2013" (01/27/2013)  . Cataract extraction w/ intraocular lens  implant, bilateral Bilateral 10-12/2012  . Back surgery  04/2013  . Atrial flutter ablation N/A 01/27/2013    Procedure: ATRIAL FLUTTER ABLATION;  Surgeon: Evans Lance, MD;  Location[facility] CATH LAB;  Service: Cardiovascular;  Laterality: N/A;  . Colonoscopy      Prescriptions prior to admission  Medication Sig Dispense Refill Last Dose  . ALPRAZolam (XANAX) 0.5 MG  tablet Take 0.5 mg by mouth as needed for anxiety (1-2 times a week for sleep).   Past Week at Unknown time  . aspirin 81 MG tablet Take 81 mg by mouth every morning.    Past Month at Unknown time  . atorvastatin (LIPITOR) 40 MG tablet Take 40 mg by mouth daily.      09/04/2015 at Unknown time  . Calcium Carbonate-Vitamin D 600-400 MG-UNIT tablet Take 1 tablet by mouth daily.   Past Month at Unknown time  . Cholecalciferol (VITAMIN D3) 5000 units CHEW Chew 1 each by mouth daily.   Past Month at Unknown time  . furosemide (LASIX) 20 MG tablet Take 20 mg by mouth daily as needed for edema. Every other day   Past Month at Unknown time  . gabapentin (NEURONTIN) 100 MG capsule Take 100-300 mg by mouth at bedtime.   Past Week at Unknown time  . losartan (COZAAR) 25 MG tablet Take 1 tablet (25 mg total) by mouth daily. 90 tablet 3 09/04/2015 at Unknown time  . Multiple Vitamins-Minerals (ONE-A-DAY 50 PLUS PO) Take 1 tablet by mouth daily.     Past Month at Unknown time  . potassium chloride SA (K-DUR,KLOR-CON) 20 MEQ tablet Take 20 mEq by mouth daily as needed (For when you take Lasix).   09/04/2015 at Unknown time  . albuterol (PROAIR HFA) 108 (90 Base) MCG/ACT inhaler Inhale 1 Inhaler into the lungs daily as needed. For wheezing and SOB   More than a month at Unknown time  . azelastine (ASTELIN) 137 MCG/SPRAY nasal spray Place 1 spray into the nose daily as needed for rhinitis.    More than a month at Unknown time  . budesonide-formoterol (SYMBICORT) 160-4.5 MCG/ACT inhaler Inhale 2 puffs into the lungs daily as needed (for allergies and SOB).   More than a month at Unknown time  . loratadine (CLARITIN) 10 MG tablet Take 10 mg by mouth daily as needed for allergies.    More than a month at Unknown time   Allergies  Allergen Reactions  . Codeine Nausea Only    Social History  Substance Use Topics  . Smoking status: Never Smoker   . Smokeless tobacco: Never Used  . Alcohol Use: 0.0 oz/week    0 Standard drinks or equivalent per week     Comment: Occasional glass of wine    Family History  Problem Relation Age of Onset  . Cancer Mother   . Allergies Mother   . Varicose Veins Mother   . Bladder Cancer Father   . Heart disease Father   . Kidney disease Father    . Diabetes Father   . Hyperlipidemia Father   . Hypertension Father   . Diabetes Sister   . Colon cancer Neg Hx      Review of Systems A comprehensive review of systems was negative.  Objective: Vital signs in last 24 hours: Temp:  [97.8 F (36.6 C)] 97.8 F (36.6 C) (07/06 0654) Pulse Rate:  [68] 68 (07/06 0654) Resp:  [20] 20 (07/06 0654) BP: (189)/(64) 189/64 mmHg (07/06 0654) SpO2:  [97 %] 97 % (07/06 0654) Weight:  [78.926 kg (174 lb)] 78.926 kg (174 lb) (07/06 0654)  EXAM: Patient well-developed well-nourished white female in no acute distress. Lungs are clear to auscultation , the patient has symmetrical respiratory excursion. Heart has a regular rate and rhythm normal S1 and S2 no murmur.   Abdomen is soft nontender nondistended bowel sounds are present. Extremity examination  shows no clubbing cyanosis or edema. Motor examination shows 5 over 5 strength in the lower extremities including the iliopsoas quadriceps dorsiflexor extensor hallicus  longus and plantar flexor bilaterally. Sensation is intact to pinprick in the distal lower extremities. Reflexes are symmetrical bilaterally. No pathologic reflexes are present. Patient has a normal gait and stance.   Data Review:CBC    Component Value Date/Time   WBC 5.1 08/26/2015 0841   RBC 4.60 08/26/2015 0841   HGB 14.2 08/26/2015 0841   HCT 42.4 08/26/2015 0841   PLT 189 08/26/2015 0841   MCV 92.2 08/26/2015 0841   MCH 30.9 08/26/2015 0841   MCHC 33.5 08/26/2015 0841   RDW 12.9 08/26/2015 0841   LYMPHSABS 1.9 03/17/2013 0850   MONOABS 0.4 03/17/2013 0850   EOSABS 0.1 03/17/2013 0850   BASOSABS 0.0 03/17/2013 0850                          BMET    Component Value Date/Time   NA 140 08/26/2015 0841   K 3.7 08/26/2015 0841   CL 105 08/26/2015 0841   CO2 28 08/26/2015 0841   GLUCOSE 185* 08/26/2015 0841   BUN 14 08/26/2015 0841   CREATININE 0.73 08/26/2015 0841   CALCIUM 9.5 08/26/2015 0841   GFRNONAA >60  08/26/2015 0841   GFRAA >60 08/26/2015 0841     Assessment/Plan: Patient with increasing low back and left lumbar radicular pain with marked to severe stenosis at L3-4 and very severe stenosis at L4-5, with a grade 1 dynamic degenerative spondylolisthesis at L4-5, and is admitted for lumbar decompression and stabilization.  I've discussed with the patient the nature of his condition, the nature the surgical procedure, the typical length of surgery, hospital stay, and overall recuperation, the limitations postoperatively, and risks of surgery. I discussed risks including risks of infection, bleeding, possibly need for transfusion, the risk of nerve root dysfunction with pain, weakness, numbness, or paresthesias, the risk of dural tear and CSF leakage and possible need for further surgery, the risk of failure of the arthrodesis and possibly for further surgery, the risk of anesthetic complications including myocardial infarction, stroke, pneumonia, and death. We discussed the need for postoperative immobilization in a lumbar brace. Understanding all this the patient does wish to proceed with surgery and is admitted for such.     Hosie Spangle, MD 09/05/2015 9:55 AM

## 2015-09-05 NOTE — Progress Notes (Signed)
Filed Vitals:   09/05/15 1702 09/05/15 1706 09/05/15 1719 09/05/15 1938  BP: 134/53  143/52 131/52  Pulse: 55  60 62  Temp:  97.9 F (36.6 C) 97.6 F (36.4 C) 97.7 F (36.5 C)  TempSrc:    Oral  Resp: 24  18 18   Weight:      SpO2: 99%  95% 97%    Patient resting in bed, has ambulated in the halls with the staff. Dressing clean and dry. Foley to straight drainage, staff to DC at 0600.  Lower extremities feeling better following surgery.  Plan: Encouraged to continue with ambulation with the staff. Will continue to progress through postoperative recovery.  Hosie Spangle, MD 09/05/2015, 9:03 PM

## 2015-09-05 NOTE — Op Note (Signed)
09/05/2015  3:42 PM  PATIENT:  Marcia Norris  70 y.o. female  PRE-OPERATIVE DIAGNOSIS:  Multilevel, multifactorial lumbar stenosis with neurogenic claudication, lumbar spondylosis, lumbar degenerative disc disease  POST-OPERATIVE DIAGNOSIS:  Multilevel, multifactorial lumbar stenosis with neurogenic claudication, lumbar spondylosis, lumbar degenerative disc disease  PROCEDURE:  Procedure(s):  L3, L4, and L5 decompressive lumbar laminectomy, bilateral L3-4 and L4-5 facetectomies, and bilateral L3, L4, and L5 foraminotomies for decompression of central canal, lateral recess, and neural foraminal stenosis, with decompression of thecal sac and exiting L3, L4, and L5 nerve roots, with decompression beyond that required for interbody arthrodesis; bilateral L3-4 and L4-5 posterior lumbar interbody arthrodesis with AVS peek interbody implants, Vitoss BA with bone marrow aspirate, and infuse; bilateral L3-L5 posterior lateral arthrodesis with segmental radius posterior instrumentation, locally harvested morcellized autograft, Vitoss BA with bone marrow aspirate, and infuse  SURGEON:  Surgeon(s): Jovita Gamma, MD Leeroy Cha, MD  ASSISTANTS: Leeroy Cha, M.D.  ANESTHESIA:   general  EBL:  Total I/O In: 2150 [I.V.:2000; Blood:150] Out: 1110 [Urine:710; Blood:400]  BLOOD ADMINISTERED:150 CC CELLSAVER  COUNT: Correct per nursing staff  DICTATION: Patient was brought to the operating room placed under general endotracheal anesthesia. The patient was turned to prone position, the lumbar region was prepped with Betadine soap and solution and draped in a sterile fashion. The midline was infiltrated with local anesthesia with epinephrine. A localizing x-ray was taken and then a midline incision was made and carried down through the subcutaneous tissue, bipolar cautery and electrocautery were used to maintain hemostasis. Dissection was carried down to the lumbar fascia. The fascia was incised  bilaterally and the paraspinal muscles were dissected with a spinous process and lamina in a subperiosteal fashion. Another x-ray was taken for localization and the L3, L4, and L5 levels were localized. Dissection was then carried out laterally over the facet complexes and the transverse processes of L3, L4, and L5 were exposed and decorticated.   An L3, L4, and L5 decompressive lumbar laminectomy was performed using double-action rongeurs, the high-speed drill, and Kerrison punches. Very thickened ligamentum flavum was carefully mobilized, to decompress the thecal sac and nerve roots. Dissection was carried out laterally including bilateral L3-4 and L4-5 facetectomies and foraminotomies with decompression of the stenotic compression of the L3, L4, and L5 nerve roots. Once the decompression of the stenotic compression of the thecal sac and exiting nerve roots was completed we proceeded with the posterior lumbar interbody arthrodesis. The annulus at each level was incised bilaterally and the disc space entered. A thorough discectomy was performed using pituitary rongeurs and curettes. Once the discectomy was completed we began to prepare the endplate surfaces, removing the cartilaginous endplates surface with paddle curettes. We then measured the height of the intervertebral disc space. We selected 9 x 25 x 4 AVS peek interbody implants for the L3-4 level, and 10 x 25 x 4 AVS peek interbody implants for the L4-5 level.  The C-arm fluoroscope was then draped and brought in the field and we identified the pedicle entry points bilaterally at the L3, L4, and L5 levels. Each of the 6 pedicles was probed, we aspirated bone marrow aspirate from the vertebral bodies, this was injected over two 10 cc strips of Vitoss BA. Then each of the pedicles was examined with the ball probe, good bony surfaces were found and no bony cuts were found. Each of the pedicles was then tapped with a 5.25 mm tap, again examined with the  ball probe good threading  was found and no bony cuts were found. We then placed 5.75 by 45 millimeter screws bilaterally at the L3 level, 5.75 by 45 millimeter screws bilaterally at the L4 level, and 5.75 by 40 millimeter screws bilaterally at the L5 level.  We then packed the AVS peek interbody implants with Vitoss BA with bone marrow aspirate and infuse, and then placed the first implant at the L3-4 level on the right side, carefully retracting the thecal sac and nerve root medially. We then went back to the left side and packed the midline with additional Vitoss BA with bone marrow aspirate and infuse, and then placed a second implant on the left side again retracting the thecal sac and nerve root medially. Then at the L4-5 level, we placed the first implant on the right side, carefully retracting the thecal sac and nerve root medially. We then went back to the left side and packed the midline with additional Vitoss BA with bone marrow aspirate and infuse, and then placed a second implant on the left side, again retracting the thecal sac and nerve root medially.   We then packed the lateral gutter over the transverse processes and intertransverse space with locally harvested morcellized autograft, Vitoss BA with bone marrow aspirate, and infuse. We then selected 60 mm pre-lordosed rods, they were placed within the screw heads and secured with locking caps once all 6 locking caps were placed final tightening was performed against a counter torque. A variable medium cross connector was then secured to the rods on each side between the L4 and L5 screw heads. It was locked down to the rod, and then the center lock was tightened down.  The wound had been irrigated multiple times during the procedure with saline solution and bacitracin solution, good hemostasis was established with a combination of bipolar cautery and Gelfoam with thrombin. The Gelfoam was removed, and a thin layer of Surgifoam was applied. Once  good hemostasis was confirmed we proceeded with closure paraspinal muscles deep fascia and Scarpa's fascia were closed with interrupted undyed 1 Vicryl sutures the subcutaneous and subcuticular closed with interrupted inverted 2-0 undyed Vicryl sutures the skin edges were approximated with Dermabond. The wound was dressed with sterile gauze and Hypafix.  Following surgery the patient was turned back to the supine position to be reversed and the anesthetic extubated and transferred to the recovery room for further care.   PLAN OF CARE: Admit for overnight observation  PATIENT DISPOSITION:  PACU - hemodynamically stable.   Delay start of Pharmacological VTE agent (>24hrs) due to surgical blood loss or risk of bleeding:  yes

## 2015-09-05 NOTE — Transfer of Care (Signed)
Immediate Anesthesia Transfer of Care Note  Patient: Marcia Norris  Procedure(s) Performed: Procedure(s): POSTERIOR LUMBAR FUSION TWO LEVEL (N/A)  Patient Location: PACU  Anesthesia Type:General  Level of Consciousness: awake and alert   Airway & Oxygen Therapy: Patient Spontanous Breathing and Patient connected to nasal cannula oxygen  Post-op Assessment: Report given to RN and Post -op Vital signs reviewed and stable  Post vital signs: Reviewed and stable  Last Vitals:  Filed Vitals:   09/05/15 0654  BP: 189/64  Pulse: 68  Temp: 36.6 C  Resp: 20    Last Pain: There were no vitals filed for this visit.    Patients Stated Pain Goal: 4 ([id number])  Complications: No apparent anesthesia complications

## 2015-09-05 NOTE — Anesthesia Preprocedure Evaluation (Signed)
Anesthesia Evaluation  Patient identified by MRN, date of birth, ID band Patient awake    Airway Mallampati: II  TM Distance: >3 FB Neck ROM: Full    Dental   Pulmonary shortness of breath, asthma , sleep apnea ,    breath sounds clear to auscultation       Cardiovascular hypertension, + Peripheral Vascular Disease  + dysrhythmias + Valvular Problems/Murmurs  Rhythm:Regular Rate:Normal     Neuro/Psych    GI/Hepatic negative GI ROS, Neg liver ROS,   Endo/Other  diabetes  Renal/GU negative Renal ROS     Musculoskeletal  (+) Arthritis ,   Abdominal   Peds  Hematology   Anesthesia Other Findings   Reproductive/Obstetrics                             Anesthesia Physical Anesthesia Plan  ASA: III  Anesthesia Plan: General   Post-op Pain Management:    Induction: Intravenous  Airway Management Planned: Oral ETT  Additional Equipment:   Intra-op Plan:   Post-operative Plan:   Informed Consent: I have reviewed the patients History and Physical, chart, labs and discussed the procedure including the risks, benefits and alternatives for the proposed anesthesia with the patient or authorized representative who has indicated his/her understanding and acceptance.   Dental advisory given  Plan Discussed with: CRNA and Anesthesiologist  Anesthesia Plan Comments:         Anesthesia Quick Evaluation

## 2015-09-05 NOTE — Anesthesia Procedure Notes (Signed)
Procedure Name: Intubation Date/Time: 09/05/2015 10:07 AM Performed by: Shirlyn Goltz Pre-anesthesia Checklist: Patient identified, Emergency Drugs available, Suction available and Patient being monitored Patient Re-evaluated:Patient Re-evaluated prior to inductionOxygen Delivery Method: Circle system utilized Preoxygenation: Pre-oxygenation with 100% oxygen Intubation Type: IV induction Ventilation: Mask ventilation without difficulty Laryngoscope Size: Mac and 3 Grade View: Grade I Tube type: Oral Tube size: 7.0 mm Number of attempts: 1 Airway Equipment and Method: Stylet Placement Confirmation: ETT inserted through vocal cords under direct vision,  positive ETCO2 and breath sounds checked- equal and bilateral Secured at: 1 cm Tube secured with: Tape Dental Injury: Teeth and Oropharynx as per pre-operative assessment

## 2015-09-06 DIAGNOSIS — F411 Generalized anxiety disorder: Secondary | ICD-10-CM | POA: Diagnosis present

## 2015-09-06 DIAGNOSIS — J45909 Unspecified asthma, uncomplicated: Secondary | ICD-10-CM | POA: Diagnosis present

## 2015-09-06 DIAGNOSIS — E785 Hyperlipidemia, unspecified: Secondary | ICD-10-CM | POA: Diagnosis present

## 2015-09-06 DIAGNOSIS — Z79899 Other long term (current) drug therapy: Secondary | ICD-10-CM | POA: Diagnosis not present

## 2015-09-06 DIAGNOSIS — M47816 Spondylosis without myelopathy or radiculopathy, lumbar region: Secondary | ICD-10-CM | POA: Diagnosis present

## 2015-09-06 DIAGNOSIS — Z961 Presence of intraocular lens: Secondary | ICD-10-CM | POA: Diagnosis present

## 2015-09-06 DIAGNOSIS — Z90710 Acquired absence of both cervix and uterus: Secondary | ICD-10-CM | POA: Diagnosis not present

## 2015-09-06 DIAGNOSIS — Z885 Allergy status to narcotic agent status: Secondary | ICD-10-CM | POA: Diagnosis not present

## 2015-09-06 DIAGNOSIS — Z9841 Cataract extraction status, right eye: Secondary | ICD-10-CM | POA: Diagnosis not present

## 2015-09-06 DIAGNOSIS — M5136 Other intervertebral disc degeneration, lumbar region: Secondary | ICD-10-CM | POA: Diagnosis present

## 2015-09-06 DIAGNOSIS — I119 Hypertensive heart disease without heart failure: Secondary | ICD-10-CM | POA: Diagnosis present

## 2015-09-06 DIAGNOSIS — Z7982 Long term (current) use of aspirin: Secondary | ICD-10-CM | POA: Diagnosis not present

## 2015-09-06 DIAGNOSIS — M4316 Spondylolisthesis, lumbar region: Secondary | ICD-10-CM | POA: Diagnosis present

## 2015-09-06 DIAGNOSIS — Z9842 Cataract extraction status, left eye: Secondary | ICD-10-CM | POA: Diagnosis not present

## 2015-09-06 DIAGNOSIS — M4806 Spinal stenosis, lumbar region: Secondary | ICD-10-CM | POA: Diagnosis present

## 2015-09-06 LAB — GLUCOSE, CAPILLARY
Glucose-Capillary: 160 mg/dL — ABNORMAL HIGH (ref 65–99)
Glucose-Capillary: 182 mg/dL — ABNORMAL HIGH (ref 65–99)

## 2015-09-06 MED ORDER — CYCLOBENZAPRINE HCL 10 MG PO TABS: 5.0000 mg | ORAL_TABLET | Freq: Three times a day (TID) | ORAL | Status: DC | PRN

## 2015-09-06 MED ORDER — ACETAMINOPHEN 500 MG PO TABS: 1000.0000 mg | ORAL_TABLET | Freq: Four times a day (QID) | ORAL | Status: DC | PRN

## 2015-09-06 MED ORDER — HYDROCODONE-ACETAMINOPHEN 5-325 MG PO TABS: 1.0000 | ORAL_TABLET | ORAL | Status: DC | PRN

## 2015-09-06 NOTE — Discharge Summary (Signed)
Physician Discharge Summary  Patient ID: Marcia Norris MRN: [id number] DOB/AGE: June 16, 1945 70 y.o.  Admit date: 09/05/2015 Discharge date: 09/06/2015  Admission Diagnoses:  Multilevel, multifactorial lumbar stenosis with neurogenic claudication, lumbar spondylosis, lumbar degenerative disc disease  Discharge Diagnoses:  Multilevel, multifactorial lumbar stenosis with neurogenic claudication, lumbar spondylosis, lumbar degenerative disc disease Active Problems:   Lumbar stenosis with neurogenic claudication   Discharged Condition: good  Hospital Course: She was admitted, underwent an L3-L5 decompressive lumbar laminectomy, and L3-4 and L4-5 placed, an L3-L5 posterior lateral arthrodesis. Postoperative she is done well. She is up and ambulate actively in the halls. Her Foley was DC'd, and she is voiding well. Her dressing was removed, and the incision is healing nicely there there is no erythema, ecchymosis, swelling, or drainage. She is being discharged to home with instructions regarding wound care and activities which have been given both the patient and her husband. She is already scheduled to follow-up with me in the office in 3 weeks, but she is to let me know of any other concerns or issues arise.  Discharge Exam: Blood pressure 105/40, pulse 96, temperature 98.6 F (37 C), temperature source Oral, resp. rate 18, weight 78.926 kg (174 lb), SpO2 93 %.  Disposition: 01-Home or Self Care     Medication List    TAKE these medications        aspirin 81 MG tablet  Take 81 mg by mouth every morning.     atorvastatin 40 MG tablet  Commonly known as:  LIPITOR  Take 40 mg by mouth daily.     azelastine 0.1 % nasal spray  Commonly known as:  ASTELIN  Place 1 spray into the nose daily as needed for rhinitis.     Calcium Carbonate-Vitamin D 600-400 MG-UNIT tablet  Take 1 tablet by mouth daily.     cyclobenzaprine 10 MG tablet  Commonly known as:  FLEXERIL  Take 0.5-1 tablets (5-10  mg total) by mouth 3 (three) times daily as needed for muscle spasms.     furosemide 20 MG tablet  Commonly known as:  LASIX  Take 20 mg by mouth daily as needed for edema. Every other day     gabapentin 100 MG capsule  Commonly known as:  NEURONTIN  Take 100-300 mg by mouth at bedtime.     HYDROcodone-acetaminophen 5-325 MG tablet  Commonly known as:  NORCO/VICODIN  Take 1-2 tablets by mouth every 4 (four) hours as needed (mild pain).     loratadine 10 MG tablet  Commonly known as:  CLARITIN  Take 10 mg by mouth daily as needed for allergies.     losartan 25 MG tablet  Commonly known as:  COZAAR  Take 1 tablet (25 mg total) by mouth daily.     ONE-A-DAY 50 PLUS PO  Take 1 tablet by mouth daily.     potassium chloride SA 20 MEQ tablet  Commonly known as:  K-DUR,KLOR-CON  Take 20 mEq by mouth daily as needed (For when you take Lasix).     PROAIR HFA 108 (90 Base) MCG/ACT inhaler  Generic drug:  albuterol  Inhale 1 Inhaler into the lungs daily as needed. For wheezing and SOB     SYMBICORT 160-4.5 MCG/ACT inhaler  Generic drug:  budesonide-formoterol  Inhale 2 puffs into the lungs daily as needed (for allergies and SOB).     Vitamin D3 5000 units Chew  Chew 1 each by mouth daily.     XANAX 0.5 MG tablet  Generic drug:  ALPRAZolam  Take 0.5 mg by mouth as needed for anxiety (1-2 times a week for sleep).         SignedHosie Spangle 09/06/2015, 6:34 PM

## 2015-09-06 NOTE — Discharge Instructions (Signed)

## 2015-09-06 NOTE — Progress Notes (Signed)
Patient alert and oriented, mae's well, voiding adequate amount of urine, swallowing without difficulty, c/o  Mild pain. Patient discharged home with spouse. Script and discharged instructions given to patient. Patient and spouse stated understanding of d/c instructions given and has an appointment with MD.

## 2015-09-06 NOTE — Anesthesia Postprocedure Evaluation (Signed)
Anesthesia Post Note  Patient: Marcia Norris  Procedure(s) Performed: Procedure(s) (LRB): POSTERIOR LUMBAR FUSION TWO LEVEL (N/A)  Patient location during evaluation: PACU Anesthesia Type: General Level of consciousness: awake Respiratory status: spontaneous breathing Cardiovascular status: blood pressure returned to baseline Anesthetic complications: no    Last Vitals:  Filed Vitals:   09/06/15 0811 09/06/15 1127  BP: 107/35 121/50  Pulse: 80 85  Temp: 36.8 C 36.6 C  Resp: 18 18    Last Pain:  Filed Vitals:   09/06/15 1145  PainSc: Asleep                 EDWARDS,Kyann Heydt

## 2015-09-06 NOTE — Progress Notes (Signed)
Filed Vitals:   09/05/15 1938 09/05/15 2300 09/06/15 0047 09/06/15 0532  BP: 131/52 97/44 131/58 101/43  Pulse: 62 59 82 78  Temp: 97.7 F (36.5 C) 98 F (36.7 C)  97.6 F (36.4 C)  TempSrc: Oral Oral  Oral  Resp: 18 18  18   Weight:      SpO2: 97% 96%  96%    Patient resting in bed, but has ambulated in the halls with a staff this morning already. Dressing clean and dry. Foley removed, no void yet, nursing staff to monitor voiding function. Little discomfort, has only used Tylenol.  Plan:  Encouraged to ambulate in the halls. Continue to progress through postoperative recovery.  Hosie Spangle, MD 09/06/2015, 8:12 AM

## 2015-09-27 DIAGNOSIS — Z981 Arthrodesis status: Secondary | ICD-10-CM | POA: Insufficient documentation

## 2015-10-28 ENCOUNTER — Ambulatory Visit (INDEPENDENT_AMBULATORY_CARE_PROVIDER_SITE_OTHER): Payer: Medicare Other | Admitting: Pulmonary Disease

## 2015-10-28 ENCOUNTER — Encounter: Payer: Self-pay | Admitting: Pulmonary Disease

## 2015-10-28 VITALS — BP 124/70 | HR 84 | Ht 66.5 in | Wt 173.0 lb

## 2015-10-28 DIAGNOSIS — G4733 Obstructive sleep apnea (adult) (pediatric): Secondary | ICD-10-CM

## 2015-10-28 DIAGNOSIS — Z9989 Dependence on other enabling machines and devices: Principal | ICD-10-CM

## 2015-10-28 NOTE — Progress Notes (Signed)
Current Outpatient Prescriptions on File Prior to Visit  Medication Sig  . albuterol (PROAIR HFA) 108 (90 Base) MCG/ACT inhaler Inhale 1 Inhaler into the lungs daily as needed. For wheezing and SOB  . ALPRAZolam (XANAX) 0.5 MG tablet Take 0.5 mg by mouth as needed for anxiety (1-2 times a week for sleep).  Marland Kitchen aspirin 81 MG tablet Take 81 mg by mouth every morning.   [name]    Marland Kitchen azelastine (ASTELIN) 137 MCG/SPRAY nasal spray Place 1 spray into the nose daily as needed for rhinitis.   . budesonide-formoterol (SYMBICORT) 160-4.5 MCG/ACT inhaler Inhale 2 puffs into the lungs daily as needed (for allergies and SOB).  . Calcium Carbonate-Vitamin D 600-400 MG-UNIT tablet Take 1 tablet by mouth daily.  . Cholecalciferol (VITAMIN D3) 5000 units CHEW Chew 1 each by mouth daily.  . cyclobenzaprine (FLEXERIL) 10 MG tablet Take 0.5-1 tablets (5-10 mg total) by mouth 3 (three) times daily as needed for muscle spasms.  . furosemide (LASIX) 20 MG tablet Take 20 mg by mouth daily as needed for edema. Every other day  . gabapentin (NEURONTIN) 100 MG capsule Take 100-300 mg by mouth at bedtime.  Marland Kitchen HYDROcodone-acetaminophen (NORCO/VICODIN) 5-325 MG tablet Take 1-2 tablets by mouth every 4 (four) hours as needed (mild pain).  Marland Kitchen loratadine (CLARITIN) 10 MG tablet Take 10 mg by mouth daily as needed for allergies.   Marland Kitchen losartan (COZAAR) 25 MG tablet Take 1 tablet (25 mg total) by mouth daily.  . Multiple Vitamins-Minerals (ONE-A-DAY 50 PLUS PO) Take 1 tablet by mouth daily.    . potassium chloride SA (K-DUR,KLOR-CON) 20 MEQ tablet Take 20 mEq by mouth daily as needed (For when you take Lasix).   No current facility-administered medications on file prior to visit.     Chief Complaint  Patient presents with  . Follow-up    Pt reports she is unable to get her CPAP mask on correctly. Pt has not been able to wear CPAP since back surgery 6 weeks ago.     Sleep  tests PSG 03/08/12 >> AHI 10.6, SpO2 low 82%  Cardiac tests: Echo 04/03/11 >> EF 60 to [id number], grade 1 diastolic CHF, mild MR  Past medical history Asthma, A flutter, DM, HLD, HTN  Past surgical history, Family history, Social history, Allergies reviewed.  Vital signs BP 124/70 (BP Location: Left Arm, Cuff Size: Normal)   Pulse 84   Ht 5' 6.5" (1.689 m)   Wt 173 lb (78.5 kg)   SpO2 97%   BMI 27.50 kg/m    History of Present Illness: Marcia Norris is a 70 y.o. female with OSA.  She has back surgery 6 weeks ago.  She hasn't been able to use CPAP as a result.  Prior to this she would use CPAP nightly.  She feels CPAP helps her sleep and daytime alertness.  She has full face mask >> mask comes off when she is asleep.  She is not sure she is putting mask on correctly.  Physical Exam:  General - No distress ENT - No sinus tenderness, MP 4, enlarged tongue, low soft palate, no oral exudate, no LAN Cardiac - s1s2 regular, no murmur Chest - No wheeze/rales/dullness Back - No focal tenderness Abd - Soft, non-tender Ext - No edema Neuro - Normal strength Skin - No rashes Psych - normal mood, and behavior   Assessment/Plan:  Obstructive sleep apnea. - continue auto CPAP -  demonstrated proper fit for her mask - will arrange for new supplies   Patient Instructions  Will arrange for new CPAP mask and supplies  Follow up in 6 months    Chesley Mires, MD [facility] Pager:  [phone] 10/28/2015, 11:00 AM

## 2015-10-28 NOTE — Patient Instructions (Addendum)
Will arrange for new CPAP mask and supplies  Follow up in 6 months

## 2016-01-14 ENCOUNTER — Encounter: Payer: Self-pay | Admitting: Cardiology

## 2016-01-18 ENCOUNTER — Encounter (HOSPITAL_COMMUNITY): Payer: Self-pay | Admitting: Oncology

## 2016-01-18 ENCOUNTER — Emergency Department (HOSPITAL_COMMUNITY): Payer: Medicare Other

## 2016-01-18 ENCOUNTER — Emergency Department (HOSPITAL_COMMUNITY)
Admission: EM | Admit: 2016-01-18 | Discharge: 2016-01-18 | Disposition: A | Discharge status: Home / Self Care | Payer: Medicare Other | Attending: Emergency Medicine | Admitting: Emergency Medicine

## 2016-01-18 DIAGNOSIS — Z7982 Long term (current) use of aspirin: Secondary | ICD-10-CM | POA: Diagnosis not present

## 2016-01-18 DIAGNOSIS — Z79899 Other long term (current) drug therapy: Secondary | ICD-10-CM | POA: Insufficient documentation

## 2016-01-18 DIAGNOSIS — M79602 Pain in left arm: Secondary | ICD-10-CM | POA: Insufficient documentation

## 2016-01-18 DIAGNOSIS — J45909 Unspecified asthma, uncomplicated: Secondary | ICD-10-CM | POA: Insufficient documentation

## 2016-01-18 DIAGNOSIS — R110 Nausea: Secondary | ICD-10-CM | POA: Diagnosis not present

## 2016-01-18 DIAGNOSIS — E119 Type 2 diabetes mellitus without complications: Secondary | ICD-10-CM | POA: Insufficient documentation

## 2016-01-18 DIAGNOSIS — I10 Essential (primary) hypertension: Secondary | ICD-10-CM | POA: Insufficient documentation

## 2016-01-18 LAB — BASIC METABOLIC PANEL
Anion gap: 8 (ref 5–15)
BUN: 16 mg/dL (ref 6–20)
CHLORIDE: 105 mmol/L (ref 101–111)
CO2: 26 mmol/L (ref 22–32)
CREATININE: 0.76 mg/dL (ref 0.44–1.00)
Calcium: 8.7 mg/dL — ABNORMAL LOW (ref 8.9–10.3)
GFR calc Af Amer: >60 mL/min (ref >60)
GFR calc non Af Amer: >60 mL/min (ref >60)
Glucose, Bld: 144 mg/dL — ABNORMAL HIGH (ref 65–99)
POTASSIUM: 3.8 mmol/L (ref 3.5–5.1)
SODIUM: 139 mmol/L (ref 135–145)

## 2016-01-18 LAB — CBC
HEMATOCRIT: 40.9 % (ref 36.0–46.0)
Hemoglobin: 13.4 g/dL (ref 12.0–15.0)
MCH: 29.5 pg (ref 26.0–34.0)
MCHC: 32.8 g/dL (ref 30.0–36.0)
MCV: 89.9 fL (ref 78.0–100.0)
PLATELETS: 211 10*3/uL (ref 150–400)
RBC: 4.55 MIL/uL (ref 3.87–5.11)
RDW: 14 % (ref 11.5–15.5)
WBC: 4.5 10*3/uL (ref 4.0–10.5)

## 2016-01-18 LAB — I-STAT CHEM 8, ED
BUN: 17 mg/dL (ref 6–20)
CALCIUM ION: 1.12 mmol/L — AB (ref 1.15–1.40)
CHLORIDE: 103 mmol/L (ref 101–111)
CREATININE: 0.7 mg/dL (ref 0.44–1.00)
GLUCOSE: 137 mg/dL — AB (ref 65–99)
HCT: 43 % (ref 36.0–46.0)
Hemoglobin: 14.6 g/dL (ref 12.0–15.0)
Potassium: 3.8 mmol/L (ref 3.5–5.1)
Sodium: 142 mmol/L (ref 135–145)
TCO2: 29 mmol/L (ref 0–100)

## 2016-01-18 LAB — I-STAT TROPONIN, ED
Troponin i, poc: 0 ng/mL (ref 0.00–0.08)
Troponin i, poc: 0 ng/mL (ref 0.00–0.08)

## 2016-01-18 MED ORDER — KETOROLAC TROMETHAMINE 15 MG/ML IJ SOLN
15.0000 mg | Freq: Once | INTRAMUSCULAR | Status: AC
  Administered 2016-01-18: 15 mg via INTRAVENOUS
  Filled 2016-01-18: qty 1

## 2016-01-18 NOTE — ED Triage Notes (Signed)
Pt c/o left arm pain that she noticed when she woke up.  Pt then developed nausea which concerned her.  Pt is A&O x 4.  Denies CP, Shob.

## 2016-01-18 NOTE — Discharge Instructions (Signed)
All of your workup has been negative here in the ED. You need to follow up with your cardiologist in the outpatient setting. He may also want to schedule an appointment with your primary care doctor if your symptoms persist. Return to the ED if you develop chest pain, shortness of breath, vomiting or for any other reason.

## 2016-01-18 NOTE — ED Provider Notes (Signed)
[facility] DEPT Provider Note   CSN: [id number] Arrival date & time: 01/18/16  [id number]     History   Chief Complaint Chief Complaint  Patient presents with  . Left Arm Pain    HPI Marcia Norris is a 70 y.o. female.  Presented to Caucasian female past medical history significant for a flutter, diabetes, hypertension, chronic left arm pain, lumbar stenosis, anxiety presents to the ED today with left arm pain and nausea. Patient states that she woke up approximately 3 AM this morning with left arm pain. She thought "she most left on her arm wrong". She got up and sat in the chair see that relieved the pain that time she started developing nausea. The patient states that she has history of a flutter and she was concerned that this might be "her a flutter returning". The pain was constant. Nothing makes better or worse. It was not associated with diaphoresis or shortness of breath. She denies any chest pain. Patient took 2 baby aspirin prior to arrival which relieved her arm pain. Moving makes the arm pain worse. Patient sees Dr. Aundra Dubin for cardiology. She denies any fever, chills, headache vision changes, lightheadedness, dizziness, chest pain, shortness of breath, abdominal pain, emesis, urinary symptoms, change in bowel habits, numbness/tingling, lower extremitiy edema.      Past Medical History:  Diagnosis Date  . Arthritis   . Asthma   . Atrial flutter ([place])   . Diabetes mellitus without complication (HCC)    borderline, diet controlled, no meds  . Diverticulosis   . Exertional shortness of breath   . Heart murmur   . Hyperglycemia   . Hyperlipidemia   . Hypertension   . OSA on CPAP 02/16/2012   wears CPAP machine at night, but takes it off during the night; pt stated "I was told I have mild sleep apnea"  . Seasonal allergies   . Varicose veins   . Vitamin D deficiency     Patient Active Problem List   Diagnosis Date Noted  . Lumbar stenosis with neurogenic  claudication 09/05/2015  . Dependent edema 05/09/2014  . Varicose veins of lower extremities with other complications 0000000  . Accumulation of fluid in tissues 08/02/2013  . RLQ abdominal pain 03/17/2013  . Paroxysmal supraventricular tachycardia ([place]) 01/27/2013  . Depression, major, recurrent, moderate ([place]) 01/10/2013  . Atrial flutter with rapid ventricular response ([place]) 12/22/2012  . LBBB (left bundle branch block) 10/22/2012  . Burning sensation of the foot 10/06/2012  . Arm pain 04/22/2012  . Mitral regurgitation 04/07/2012  . OSA (obstructive sleep apnea) 02/16/2012  . Cardiac murmur 04/24/2011  . Intermittent tremor 04/24/2011  . Urge incontinence 04/24/2011  . Murmur 03/30/2011  . Tremor 03/30/2011  . LBP (low back pain) 06/17/2010  . Bone/cartilage disorder 02/17/2010  . Elevated fasting blood sugar 02/17/2010  . Essential (primary) hypertension 02/17/2010  . Avitaminosis D 02/17/2010  . Carotid stenosis 12/19/2009  . ABNORMAL EKG 12/19/2009  . Abnormal ECG 12/19/2009  . Clinical depression 11/15/2009  . Anxiety, generalized 11/15/2009  . Cannot sleep 04/26/2009  . CHEST PAIN-UNSPECIFIED 01/17/2009  . PALPITATIONS, RECURRENT 08/01/2008  . SHORTNESS OF BREATH 08/01/2008  . BLADDER CANCER 05/28/2008  . Hypertensive heart disease 05/28/2008  . Chronic venous insufficiency 03/21/2008  . Colon, diverticulosis 09/05/2007  . Cardiovascular symptoms 09/05/2007  . HYPERLIPIDEMIA 01/04/2007  . ALLERGIC RHINITIS 01/04/2007  . ASTHMA 01/04/2007  . COUGH, CHRONIC 01/04/2007    Past Surgical History:  Procedure Laterality Date  .  ABDOMINAL HYSTERECTOMY  1990's   partial  . ATRIAL FLUTTER ABLATION  01/27/2013  . ATRIAL FLUTTER ABLATION N/A 01/27/2013   Procedure: ATRIAL FLUTTER ABLATION;  Surgeon: Evans Lance, MD;  Location: [facility] CATH LAB;  Service: Cardiovascular;  Laterality: N/A;  . BACK SURGERY  04/2013  . CATARACT EXTRACTION W/ INTRAOCULAR LENS  IMPLANT,  BILATERAL Bilateral 10-12/2012  . COLONOSCOPY    . CYSTOSCOPY     "multiple after bladder tumor removed; released in 2013" (01/27/2013)  . TRANSURETHRAL RESECTION OF BLADDER TUMOR  ~ 2004    OB History    No data available       Home Medications    Prior to Admission medications   Medication Sig Start Date End Date Taking? Authorizing Provider  albuterol (PROAIR HFA) 108 (90 Base) MCG/ACT inhaler Inhale 1 Inhaler into the lungs daily as needed. For wheezing and SOB 06/19/15 06/18/16  Historical Provider, MD  ALPRAZolam Duanne Moron) 0.5 MG tablet Take 0.5 mg by mouth as needed for anxiety (1-2 times a week for sleep).    Historical Provider, MD  aspirin 81 MG tablet Take 81 mg by mouth every morning.     Historical Provider, MD  atorvastatin (LIPITOR) 40 MG tablet Take 40 mg by mouth daily.      Historical Provider, MD  azelastine (ASTELIN) 137 MCG/SPRAY nasal spray Place 1 spray into the nose daily as needed for rhinitis.  03/22/11   Historical Provider, MD  budesonide-formoterol (SYMBICORT) 160-4.5 MCG/ACT inhaler Inhale 2 puffs into the lungs daily as needed (for allergies and SOB).    Historical Provider, MD  Calcium Carbonate-Vitamin D 600-400 MG-UNIT tablet Take 1 tablet by mouth daily.    Historical Provider, MD  Cholecalciferol (VITAMIN D3) 5000 units CHEW Chew 1 each by mouth daily.    Historical Provider, MD  cyclobenzaprine (FLEXERIL) 10 MG tablet Take 0.5-1 tablets (5-10 mg total) by mouth 3 (three) times daily as needed for muscle spasms. 09/06/15   Jovita Gamma, MD  furosemide (LASIX) 20 MG tablet Take 20 mg by mouth daily as needed for edema. Every other day 07/21/13   Larey Dresser, MD  gabapentin (NEURONTIN) 100 MG capsule Take 100-300 mg by mouth at bedtime.    Historical Provider, MD  HYDROcodone-acetaminophen (NORCO/VICODIN) 5-325 MG tablet Take 1-2 tablets by mouth every 4 (four) hours as needed (mild pain). 09/06/15   Jovita Gamma, MD  loratadine (CLARITIN) 10 MG tablet  Take 10 mg by mouth daily as needed for allergies.     Historical Provider, MD  losartan (COZAAR) 25 MG tablet Take 1 tablet (25 mg total) by mouth daily. 04/17/15   Larey Dresser, MD  Multiple Vitamins-Minerals (ONE-A-DAY 50 PLUS PO) Take 1 tablet by mouth daily.      Historical Provider, MD  potassium chloride SA (K-DUR,KLOR-CON) 20 MEQ tablet Take 20 mEq by mouth daily as needed (For when you take Lasix).    Historical Provider, MD    Family History Family History  Problem Relation Age of Onset  . Cancer Mother   . Allergies Mother   . Varicose Veins Mother   . Bladder Cancer Father   . Heart disease Father   . Kidney disease Father   . Diabetes Father   . Hyperlipidemia Father   . Hypertension Father   . Diabetes Sister   . Colon cancer Neg Hx     Social History Social History  Substance Use Topics  . Smoking status: Never Smoker  . Smokeless  tobacco: Never Used  . Alcohol use 0.0 oz/week     Comment: Occasional glass of wine     Allergies   Codeine   Review of Systems Review of Systems  Constitutional: Negative for chills and fever.  HENT: Negative for congestion.   Respiratory: Negative for cough and shortness of breath.   Cardiovascular: Negative for chest pain and palpitations.  Gastrointestinal: Positive for nausea. Negative for abdominal pain and vomiting.  Genitourinary: Negative for frequency and urgency.  Musculoskeletal: Positive for arthralgias and myalgias. Negative for neck pain and neck stiffness.  Skin: Negative.   Neurological: Negative for dizziness, syncope, weakness, light-headedness, numbness and headaches.  All other systems reviewed and are negative.    Physical Exam Updated Vital Signs BP 132/61   Pulse 63   Temp 97.7 F (36.5 C) (Oral)   Resp 14   Ht 5' 6.5" (1.689 m)   Wt 77.1 kg   SpO2 98%   BMI 27.03 kg/m   Physical Exam  Constitutional: She is oriented to person, place, and time. She appears well-developed and  well-nourished. No distress.  HENT:  Head: Normocephalic and atraumatic.  Mouth/Throat: Oropharynx is clear and moist.  Eyes: Conjunctivae are normal. Pupils are equal, round, and reactive to light. Right eye exhibits no discharge. Left eye exhibits no discharge. No scleral icterus.  Neck: Normal range of motion. Neck supple. No thyromegaly present.  Cardiovascular: Normal rate, regular rhythm, normal heart sounds and intact distal pulses.  Exam reveals no gallop and no friction rub.   No murmur heard. Pulmonary/Chest: Effort normal and breath sounds normal. No respiratory distress. She has no decreased breath sounds. She has no wheezes. She has no rhonchi. She has no rales.  No chest wall tendneress.   Abdominal: Soft. Bowel sounds are normal. She exhibits no distension. There is no tenderness.  Musculoskeletal: Normal range of motion.  Patient with tenderness to palpation of the left bicep tendon. No erythema, ecchymosis, edema. Full range of motion. No crepitus or deformity noted. Radial pulses are 2+ bilaterally. Sensation intact. Cap refill normal.No effusion noted. No bony tenderness. Moving all four extremities without any difficulty.  Lymphadenopathy:    She has no cervical adenopathy.  Neurological: She is alert and oriented to person, place, and time.  Skin: Skin is warm and dry. Capillary refill takes less than 2 seconds.  Nursing note and vitals reviewed.    ED Treatments / Results  Labs (all labs ordered are listed, but only abnormal results are displayed) Labs Reviewed  BASIC METABOLIC PANEL - Abnormal; Notable for the following:       Result Value   Glucose, Bld 144 (*)    Calcium 8.7 (*)    All other components within normal limits  I-STAT CHEM 8, ED - Abnormal; Notable for the following:    Glucose, Bld 137 (*)    Calcium, Ion 1.12 (*)    All other components within normal limits  CBC  I-STAT TROPOININ, ED  I-STAT TROPOININ, ED    EKG  EKG  Interpretation  Date/Time:  Saturday January 18 2016 05:38:00 EST Ventricular Rate:  70 PR Interval:    QRS Duration: 137 QT Interval:  471 QTC Calculation: 509 R Axis:   36 Text Interpretation:  Sinus rhythm Left bundle branch block Confirmed by [facility]  MD, APRIL ([id number]) on 01/18/2016 5:45:01 AM       Radiology Dg Chest 2 View  Result Date: 01/18/2016 CLINICAL DATA:  Awakened with left-sided chest pain radiating  down the arm. EXAM: CHEST  2 VIEW COMPARISON:  12/21/2012 FINDINGS: The lungs are clear. The pulmonary vasculature is normal. Heart size is normal. Hilar and mediastinal contours are unremarkable. There is no pleural effusion. IMPRESSION: No active cardiopulmonary disease. Electronically Signed   By: Andreas Newport M.D.   On: 01/18/2016 06:14    Procedures Procedures (including critical care time)  Medications Ordered in ED Medications - No data to display   Initial Impression / Assessment and Plan / ED Course  I have reviewed the triage vital signs and the nursing notes.  Pertinent labs & imaging results that were available during my care of the patient were reviewed by me and considered in my medical decision making (see chart for details).  Clinical Course   Patient presents with left arm pain and nausea. EKG was without acute changes and unchanged from prior ekg. Heart pathway score a 4. Delta troponin were negative. She denies any cp or sob. Patient has history of heart palpitation. Exam is consistent with tendonitis. All labs have been unremarkable. Moving her left arms makes the pain worse. CXR was normal. I have a low suspicion for ACS at this time. Patient was seen and examined by Dr. Randal Buba who agrees with the above. She rec delta troponin, pain meds, and follow up in the outpatient setting. Toradol was given with sign relief of patient's left arm pain.  Patient encouraged to follow up with pcp and cardio next week. Pt is hemodynamically stable, in  NAD, & able to ambulate in the ED. Pain has been managed & has no complaints prior to dc. Pt is comfortable with above plan and is stable for discharge at this time. All questions were answered prior to disposition. Strict return precautions for f/u to the ED were discussed.   Final Clinical Impressions(s) / ED Diagnoses   Final diagnoses:  Left arm pain  Nausea    New Prescriptions New Prescriptions   No medications on file     Doristine Devoid, PA-C 01/19/16 1502    Doristine Devoid, PA-C 01/19/16 1503    Veatrice Kells, MD 01/19/16 2327

## 2016-02-12 ENCOUNTER — Ambulatory Visit (HOSPITAL_COMMUNITY)
Admission: RE | Admit: 2016-02-12 | Discharge: 2016-02-12 | Disposition: A | Discharge status: Home / Self Care | Payer: Medicare Other | Source: Ambulatory Visit | Attending: Cardiovascular Disease | Admitting: Cardiovascular Disease

## 2016-02-12 DIAGNOSIS — I10 Essential (primary) hypertension: Secondary | ICD-10-CM | POA: Insufficient documentation

## 2016-02-12 DIAGNOSIS — I6523 Occlusion and stenosis of bilateral carotid arteries: Secondary | ICD-10-CM

## 2016-02-12 DIAGNOSIS — E785 Hyperlipidemia, unspecified: Secondary | ICD-10-CM | POA: Diagnosis not present

## 2016-02-12 DIAGNOSIS — I6522 Occlusion and stenosis of left carotid artery: Secondary | ICD-10-CM | POA: Insufficient documentation

## 2016-02-17 ENCOUNTER — Encounter: Payer: Self-pay | Admitting: Cardiology

## 2016-05-13 ENCOUNTER — Other Ambulatory Visit (HOSPITAL_COMMUNITY): Payer: Self-pay | Admitting: Cardiology

## 2016-05-22 ENCOUNTER — Other Ambulatory Visit (HOSPITAL_COMMUNITY): Payer: Self-pay | Admitting: Cardiology

## 2016-05-22 NOTE — Telephone Encounter (Signed)
Medication Detail    Disp Refills Start End   losartan (COZAAR) 25 MG tablet 90 tablet 0 05/13/2016    Sig - Route: Take 1 tablet (25 mg total) by mouth daily. *Please call and schedule a one year follow up appointment* - Oral   E-Prescribing Status: Receipt confirmed by pharmacy (05/13/2016 3:58 PM EDT)   Pharmacy   [id number] - [place], [place] - [name]

## 2016-07-02 ENCOUNTER — Emergency Department (HOSPITAL_COMMUNITY)
Admission: EM | Admit: 2016-07-02 | Discharge: 2016-07-02 | Discharge status: Absent without leave | Payer: Medicare Other

## 2016-07-07 ENCOUNTER — Encounter: Payer: Self-pay | Admitting: Internal Medicine

## 2016-07-23 ENCOUNTER — Encounter: Payer: Self-pay | Admitting: Internal Medicine

## 2016-07-23 ENCOUNTER — Ambulatory Visit (INDEPENDENT_AMBULATORY_CARE_PROVIDER_SITE_OTHER): Payer: Medicare Other | Admitting: Internal Medicine

## 2016-07-23 VITALS — BP 122/82 | HR 74 | Ht 66.5 in | Wt 174.4 lb

## 2016-07-23 DIAGNOSIS — Z8679 Personal history of other diseases of the circulatory system: Secondary | ICD-10-CM | POA: Diagnosis not present

## 2016-07-23 DIAGNOSIS — R600 Localized edema: Secondary | ICD-10-CM

## 2016-07-23 DIAGNOSIS — R0989 Other specified symptoms and signs involving the circulatory and respiratory systems: Secondary | ICD-10-CM | POA: Diagnosis not present

## 2016-07-23 DIAGNOSIS — M79605 Pain in left leg: Secondary | ICD-10-CM

## 2016-07-23 DIAGNOSIS — I340 Nonrheumatic mitral (valve) insufficiency: Secondary | ICD-10-CM

## 2016-07-23 NOTE — Patient Instructions (Signed)
Medication Instructions:  Your physician recommends that you continue on your current medications as directed. Please refer to the Current Medication list given to you today.   Labwork: None   Testing/Procedures: Your physician has requested that you have an echocardiogram. Echocardiography is a painless test that uses sound waves to create images of your heart. It provides your doctor with information about the size and shape of your heart and how well your heart's chambers and valves are working. This procedure takes approximately one hour. There are no restrictions for this procedure.  Your physician has requested that you have a lower  extremity arterial duplex. This test is an ultrasound of the arteries in the legs . It looks at arterial blood flow in the legs . Allow one hour for Lower Arterial scans. There are no restrictions or special instructions   Follow-Up: Your physician wants you to follow-up in: 1 year with Dr End. (May 2019).  You will receive a reminder letter in the mail two months in advance. If you don't receive a letter, please call our office to schedule the follow-up appointment.   Any Other Special Instructions Will Be Listed Below (If Applicable).     If you need a refill on your cardiac medications before your next appointment, please call your pharmacy.

## 2016-07-23 NOTE — Progress Notes (Signed)
Follow-up Outpatient Visit Date: 07/23/2016  Primary Care Provider: Vicenta Aly, [place] [address]  Chief Complaint: Follow-up SVT  HPI:  Marcia Norris is a 71 y.o. year-old female with history of SVT s/p AVRT/AVNRT ablation, chronic LBBB, HTN, HLD, OSA, and mild carotid artery stenosis who presents for follow-up of SVT. She was previously followed by Dr. Aundra Dubin and was last seen in our office in 08/2015. Today, [name] reports feeling relatively well. She has noticed occasional discomfort along her left shin, where she had injured herself last year. She describes the discomfort as a burning and stabbing sensation, which often occurs when touching the area. She does not have any pain with walking or activity. She denies palpitations and lightheadedness. She notes occasional dependent lower extremity edema. She has not had chest pain, shortness of breath, orthopnea, or PND. She wears CPAP approximately 50% of the time. She rarely uses as needed furosemide.  --------------------------------------------------------------------------------------------------  Cardiovascular History & Procedures: Cardiovascular Problems:  SVT s/p ablation  Chronic left bundle branch block  Mitral regurgitation  Risk Factors:  Hypertension, hyperlipidemia, carotid artery stenosis  Cath/PCI:  None  CV Surgery:  None  EP Procedures and Devices:  SVT ablation (01/27/13, Dr. Lovena Le): Radial frequency ablation of AVNRT using decremental conducting postero-septal accessory pathway along with AVNRT.  Non-Invasive Evaluation(s):  Carotid artery Doppler (02/12/16): Heterogeneous plaque on the left. Normal right carotid artery. Stable 2-53% LICA stenosis. Normal subclavian arteries, bilaterally. Patent vertebral arteries with antegrade flow.  TTE (10/11/12): Normal LV size with mild LVH. LVEF 55-65% with grade 1 diastolic dysfunction. Mild MR. Normal RV size and  function.  Pharmacologic MPI (04/25/12): Low risk study without ischemia or scar. Normal LV function (EF 62%).  Recent CV Pertinent Labs: Lab Results  Component Value Date   INR 0.99 12/22/2012   K 3.8 01/18/2016   MG 2.1 12/22/2012   BUN 17 01/18/2016   CREATININE 0.70 01/18/2016    Past medical and surgical history were reviewed and updated in EPIC.  Outpatient Encounter Prescriptions as of 07/23/2016  Medication Sig  . ALPRAZolam (XANAX) 0.5 MG tablet Take 0.5 mg by mouth at bedtime as needed for sleep.   Marland Kitchen aspirin 81 MG tablet Take 81 mg by mouth every morning.   [name]    . budesonide-formoterol (SYMBICORT) 160-4.5 MCG/ACT inhaler Inhale 2 puffs into the lungs daily as needed (for allergies and SOB).  . Calcium Carbonate-Vitamin D 600-400 MG-UNIT tablet Take 1 tablet by mouth daily.  . chlorpheniramine-HYDROcodone (TUSSIONEX) 10-8 MG/5ML SUER Take 5 mLs by mouth at bedtime as needed for cough.   . Cholecalciferol (VITAMIN D3) 5000 units CHEW Chew 5,000 Units by mouth daily.   . cyclobenzaprine (FLEXERIL) 10 MG tablet Take 0.5-1 tablets (5-10 mg total) by mouth 3 (three) times daily as needed for muscle spasms.  . furosemide (LASIX) 20 MG tablet Take 20 mg by mouth daily as needed for edema. Every other day  . losartan (COZAAR) 25 MG tablet Take 1 tablet (25 mg total) by mouth daily. *Please call and schedule a one year follow up appointment*  . Multiple Vitamins-Minerals (ONE-A-DAY 50 PLUS PO) Take 1 tablet by mouth daily.    . mupirocin cream (BACTROBAN) 2 % Apply 1 application topically daily as needed (use once daily as needed for burn scar (itching)).   . potassium chloride SA (K-DUR,KLOR-CON) 20 MEQ tablet Take 20 mEq by  mouth daily as needed (For when you take Lasix).  . [DISCONTINUED] gabapentin (NEURONTIN) 100 MG capsule Take 200 mg by mouth at bedtime.   . [DISCONTINUED] HYDROcodone-acetaminophen (NORCO/VICODIN)  5-325 MG tablet Take 1-2 tablets by mouth every 4 (four) hours as needed (mild pain).   No facility-administered encounter medications on file as of 07/23/2016.     Allergies: Codeine  Social History   Social History  . Marital status: Married    Spouse name: N/A  . Number of children: 2  . Years of education: 13   Occupational History  . retired    Social History Main Topics  . Smoking status: Never Smoker  . Smokeless tobacco: Never Used  . Alcohol use 0.0 oz/week     Comment: Occasional glass of wine  . Drug use: No  . Sexual activity: Yes   Other Topics Concern  . Not on file   Social History Narrative   Lives at home with her husband.   Right-handed.   1 cups caffeine/day.          Family History  Problem Relation Age of Onset  . Cancer Mother   . Allergies Mother   . Varicose Veins Mother   . Bladder Cancer Father   . Heart disease Father   . Kidney disease Father   . Diabetes Father   . Hyperlipidemia Father   . Hypertension Father   . Diabetes Sister   . Colon cancer Neg Hx     Review of Systems: A 12-system review of systems was performed and was negative except as noted in the HPI.  --------------------------------------------------------------------------------------------------  Physical Exam: BP 122/82   Pulse 74   Ht 5' 6.5" (1.689 m)   Wt 174 lb 6.4 oz (79.1 kg)   BMI 27.73 kg/m   General:  Well-developed, well-nourished woman, seated comfortably in the exam room. HEENT: No conjunctival pallor or scleral icterus.  Moist mucous membranes.  OP clear. Neck: Supple without lymphadenopathy, thyromegaly, JVD, or HJR.  Soft bilateral carotid bruits noted. Lungs: Normal work of breathing.  Clear to auscultation bilaterally without wheezes or crackles. Heart: Regular rate and rhythm with 2/6 systolic murmur heard across the precordium (question radiation to the carotids). No rubs or gallops.  Non-displaced PMI. Abd: Bowel sounds present.   Soft, NT/ND without hepatosplenomegaly Ext: Trace pretibial edema bilaterally with evidence of varicose vein. 2+ radial pulses bilaterally. 2+ right posterior tibial and dorsalis pedis pulses. Trace dorsalis pedis and 1+ tibial pulses left. Skin: warm and dry without rash  EKG:  Normal sinus rhythm with left bundle branch block.  Lab Results  Component Value Date   WBC 4.5 01/18/2016   HGB 14.6 01/18/2016   HCT 43.0 01/18/2016   MCV 89.9 01/18/2016   PLT 211 01/18/2016    Lab Results  Component Value Date   NA 142 01/18/2016   K 3.8 01/18/2016   CL 103 01/18/2016   CO2 26 01/18/2016   BUN 17 01/18/2016   CREATININE 0.70 01/18/2016   GLUCOSE 137 (H) 01/18/2016   ALT 52 (H) 03/17/2013    No results found for: CHOL, HDL, LDLCALC, LDLDIRECT, TRIG, CHOLHDL   Outside lipid panel (05/01/16): Total cholesterol 156, triglyceride 115, HDL 44, LDL 89  --------------------------------------------------------------------------------------------------  ASSESSMENT AND PLAN: History of supraventricular tachycardia No recurrence of palpitations. Continue current medications.  Lower extremity edema This has been a chronic problem and seems dependent. I encouraged Ms. Mcphail to elevate her legs and wear compression stockings when possible.  Mitral regurgitation The most recent echo 2014 showed mild MR. 2/6 murmur heard across the precordium today. She does not have any heart failure symptoms, though I question whether some of her leg edema may also be related to cardiac pathology, particularly given intermittent CPAP use. We have agreed to repeat an echocardiogram for further evaluation.  Left shin pain and diminished pedal pulses Pain is most likely neuropathic following prior injury to this area. However, left pedal pulses are diminished. We have agreed to obtain ABIs for further characterization.  Carotid artery stenosis Mild left internal carotid artery stenosis noted. We will continue  with medical therapy, including aspirin and atorvastatin. Most recent LDL at her PCPs office was reasonable at 89 on 05/01/16.  Follow-up: Return to clinic in 1 year.  Nelva Bush, MD 07/23/2016 12:48 PM

## 2016-08-03 ENCOUNTER — Other Ambulatory Visit: Payer: Self-pay | Admitting: Internal Medicine

## 2016-08-03 DIAGNOSIS — R0989 Other specified symptoms and signs involving the circulatory and respiratory systems: Secondary | ICD-10-CM

## 2016-08-03 DIAGNOSIS — M79605 Pain in left leg: Secondary | ICD-10-CM

## 2016-08-04 ENCOUNTER — Ambulatory Visit (HOSPITAL_COMMUNITY): Payer: Medicare Other | Attending: Cardiovascular Disease

## 2016-08-04 ENCOUNTER — Other Ambulatory Visit: Payer: Self-pay

## 2016-08-04 DIAGNOSIS — I447 Left bundle-branch block, unspecified: Secondary | ICD-10-CM | POA: Diagnosis not present

## 2016-08-04 DIAGNOSIS — I340 Nonrheumatic mitral (valve) insufficiency: Secondary | ICD-10-CM | POA: Insufficient documentation

## 2016-08-04 DIAGNOSIS — I119 Hypertensive heart disease without heart failure: Secondary | ICD-10-CM | POA: Diagnosis not present

## 2016-08-04 DIAGNOSIS — M79605 Pain in left leg: Secondary | ICD-10-CM | POA: Diagnosis not present

## 2016-08-04 DIAGNOSIS — E785 Hyperlipidemia, unspecified: Secondary | ICD-10-CM | POA: Diagnosis not present

## 2016-08-04 DIAGNOSIS — R600 Localized edema: Secondary | ICD-10-CM | POA: Insufficient documentation

## 2016-08-04 DIAGNOSIS — G4733 Obstructive sleep apnea (adult) (pediatric): Secondary | ICD-10-CM | POA: Insufficient documentation

## 2016-08-04 DIAGNOSIS — R0989 Other specified symptoms and signs involving the circulatory and respiratory systems: Secondary | ICD-10-CM | POA: Diagnosis not present

## 2016-08-12 ENCOUNTER — Ambulatory Visit (HOSPITAL_COMMUNITY)
Admission: RE | Admit: 2016-08-12 | Discharge: 2016-08-12 | Disposition: A | Discharge status: Home / Self Care | Payer: Medicare Other | Source: Ambulatory Visit | Attending: Cardiovascular Disease | Admitting: Cardiovascular Disease

## 2016-08-12 DIAGNOSIS — R0989 Other specified symptoms and signs involving the circulatory and respiratory systems: Secondary | ICD-10-CM

## 2016-08-12 DIAGNOSIS — M79605 Pain in left leg: Secondary | ICD-10-CM

## 2016-08-13 ENCOUNTER — Other Ambulatory Visit (HOSPITAL_COMMUNITY): Payer: Self-pay | Admitting: Cardiology

## 2017-03-01 DIAGNOSIS — H903 Sensorineural hearing loss, bilateral: Secondary | ICD-10-CM | POA: Insufficient documentation

## 2017-05-05 DIAGNOSIS — L280 Lichen simplex chronicus: Secondary | ICD-10-CM | POA: Insufficient documentation

## 2017-08-04 ENCOUNTER — Ambulatory Visit: Payer: Medicare Other | Admitting: Internal Medicine

## 2017-08-04 VITALS — BP 130/68 | HR 83 | Ht 66.5 in | Wt 168.0 lb

## 2017-08-04 DIAGNOSIS — I6522 Occlusion and stenosis of left carotid artery: Secondary | ICD-10-CM

## 2017-08-04 DIAGNOSIS — I340 Nonrheumatic mitral (valve) insufficiency: Secondary | ICD-10-CM | POA: Diagnosis not present

## 2017-08-04 DIAGNOSIS — I471 Supraventricular tachycardia, unspecified: Secondary | ICD-10-CM

## 2017-08-04 DIAGNOSIS — I10 Essential (primary) hypertension: Secondary | ICD-10-CM

## 2017-08-04 DIAGNOSIS — G8929 Other chronic pain: Secondary | ICD-10-CM | POA: Diagnosis not present

## 2017-08-04 DIAGNOSIS — M79605 Pain in left leg: Secondary | ICD-10-CM

## 2017-08-04 NOTE — Patient Instructions (Addendum)
Medication Instructions:  Your physician recommends that you continue on your current medications as directed. Please refer to the Current Medication list given to you today.  -- If you need a refill on your cardiac medications before your next appointment, please call your pharmacy. --  Labwork: None ordered  Testing/Procedures: None ordered  Follow-Up: Your physician wants you to follow-up in: 1 year with Dr. Saunders Revel.    You will receive a reminder letter in the mail two months in advance. If you don't receive a letter, please call our office to schedule the follow-up appointment.  Thank you for choosing [facility]!!    Any Other Special Instructions Will Be Listed Below (If Applicable).  Monitor BP at home and call us if consistently stays above 130/80

## 2017-08-04 NOTE — Progress Notes (Signed)
Follow-up Outpatient Visit Date: 08/04/2017  Primary Care Provider: Vicenta Aly, [place] [address]  Chief Complaint: Follow-up SVT and leg pain  HPI:  Ms. Garritano is a 72 y.o. year-old female with history of SVT s/p AVRT/AVNRT ablation, chronic LBBB, HTN, HLD, OSA, and mild carotid artery stenosis, who presents for follow-up of SVT.  I last saw her a year ago, at which time she was most concerned about pain overlying the left shin.  Though the pain was most suggestive of neuropathy, we subsequently obtained ABIs.  The study showed elevated ABIs due to noncompressible arteries but normal TBI's.  Further work-up was not recommended.  Today, Ms. Sobh reports feeling fairly well.  Left leg pain and sensitivity have improved a little bit since last year.  She was diagnosed with diabetes and started on metformin since her last visit.  Blood sugar has been well controlled.  She denies palpitations as well as chest pain, shortness of breath, and edema.  She notes rare orthostatic lightheadedness without syncope or falls.  She is walking regularly at home.  Blood pressure on home checks is somewhat variable but typically is less than 130/80.  --------------------------------------------------------------------------------------------------  Cardiovascular History & Procedures: Cardiovascular Problems:  SVT s/p ablation  Chronic left bundle branch block  Mitral regurgitation  Risk Factors:  Hypertension, hyperlipidemia, carotid artery stenosis  Cath/PCI:  None  CV Surgery:  None  EP Procedures and Devices:  SVT ablation (01/27/13, Dr. Lovena Le): Radial frequency ablation of AVNRT using decremental conducting postero-septal accessory pathway along with AVNRT.  Non-Invasive Evaluation(s):  ABIs (08/12/2016): ABIs right 1.3, left 1.4.  TBI right 0.85, left 0.83.  TTE (08/04/2016): Normal LV size with mild focal basal hypertrophy of the  septum.  LVEF 55-60%.  Grade 1 diastolic dysfunction.  Mild MR.  Normal RV size and function.  Carotid artery Doppler (02/12/16): Heterogeneous plaque on the left. Normal right carotid artery. Stable 0-34% LICA stenosis. Normal subclavian arteries, bilaterally. Patent vertebral arteries with antegrade flow.  TTE (10/11/12): Normal LV size with mild LVH. LVEF 55-65% with grade 1 diastolic dysfunction. Mild MR. Normal RV size and function.  Pharmacologic MPI (04/25/12): Low risk study without ischemia or scar. Normal LV function (EF 62%).   Recent CV Pertinent Labs: Lab Results  Component Value Date   INR 0.99 12/22/2012   K 3.8 01/18/2016   MG 2.1 12/22/2012   BUN 17 01/18/2016   CREATININE 0.70 01/18/2016    Past medical and surgical history were reviewed and updated in EPIC.  Current Meds  Medication Sig  . ALPRAZolam (XANAX) 0.5 MG tablet Take 0.5 mg by mouth at bedtime as needed for sleep.   Marland Kitchen aspirin 81 MG tablet Take 81 mg by mouth every morning.   [name]    . budesonide-formoterol (SYMBICORT) 160-4.5 MCG/ACT inhaler Inhale 2 puffs into the lungs daily as needed (for allergies and SOB).  . chlorpheniramine-HYDROcodone (TUSSIONEX) 10-8 MG/5ML SUER Take 5 mLs by mouth at bedtime as needed for cough.   . cyclobenzaprine (FLEXERIL) 10 MG tablet Take 0.5-1 tablets (5-10 mg total) by mouth 3 (three) times daily as needed for muscle spasms.  . furosemide (LASIX) 20 MG tablet Take 20 mg by mouth daily as needed for edema. Every other day  . losartan (COZAAR) 25 MG tablet TAKE 1 TABLET DAILY  . Multiple Vitamins-Minerals (ONE-A-DAY 50 PLUS PO) Take 1 tablet by mouth daily.    Marland Kitchen  mupirocin cream (BACTROBAN) 2 % Apply 1 application topically daily as needed (use once daily as needed for burn scar (itching)).   . potassium chloride SA (K-DUR,KLOR-CON) 20 MEQ tablet Take 20 mEq by mouth daily as needed (For when you take Lasix).  .  [DISCONTINUED] Calcium Carbonate-Vitamin D 600-400 MG-UNIT tablet Take 1 tablet by mouth daily.  . [DISCONTINUED] Cholecalciferol (VITAMIN D3) 5000 units CHEW Chew 5,000 Units by mouth daily.     Allergies: Codeine  Social History   Tobacco Use  . Smoking status: Never Smoker  . Smokeless tobacco: Never Used  Substance Use Topics  . Alcohol use: Yes    Alcohol/week: 0.0 oz    Comment: Occasional glass of wine  . Drug use: No    Family History  Problem Relation Age of Onset  . Cancer Mother   . Allergies Mother   . Varicose Veins Mother   . Bladder Cancer Father   . Heart disease Father   . Kidney disease Father   . Diabetes Father   . Hyperlipidemia Father   . Hypertension Father   . Diabetes Sister   . Colon cancer Neg Hx     Review of Systems: A 12-system review of systems was performed and was negative except as noted in the HPI.  --------------------------------------------------------------------------------------------------  Physical Exam: BP 130/68   Pulse 83   Ht 5' 6.5" (1.689 m)   Wt 168 lb (76.2 kg)   BMI 26.71 kg/m   General: NAD. HEENT: No conjunctival pallor or scleral icterus. Moist mucous membranes.  OP clear. Neck: Supple without lymphadenopathy, thyromegaly, JVD, or HJR. Lungs: Normal work of breathing. Clear to auscultation bilaterally without wheezes or crackles. Heart: Regular rate and rhythm with 2/6 holosystolic murmur loudest at the left lower sternal border. Non-displaced PMI. Abd: Bowel sounds present. Soft, NT/ND without hepatosplenomegaly Ext: No lower extremity edema. Skin: Warm and dry without rash.  EKG:  NSR with LBBB (old).  Outside labs: CMP (02/10/2017): Sodium 141, potassium 4.2, chloride 103, CO2 26, BUN 12, creatinine 0.6, AST 19, ALT 21, alkaline phosphatase 88, total bilirubin 0.6, total protein 6.8, albumin 4.3  Lipid panel (02/10/2017): Total cholesterol 136, triglycerides 169, HDL 40, LDL 62  TSH (02/10/2017):  4.47  --------------------------------------------------------------------------------------------------  ASSESSMENT AND PLAN: History of SVT No palpitations or other symptoms to suggest recurrence.  EKG stable with sinus rhythm and left bundle branch block.  Continue current medications.  Mitral regurgitation  No symptoms to suggest worsening valve function.  Echo last year showed mild MR with preserved LV function.  No medication changes at this time.  Continue clinical follow-up.  Left lower extremity pain Improved since last year.  Vascular studies without evidence of significant arterial disease.  Question underlying neuropathy, particularly given interval diagnosis of diabetes.  I will defer further management to Ms. Anderson.  Hypertension Blood pressure borderline elevated today (goal less than 130/80).  I have asked Ms. Harle Battiest to monitor her blood pressure at home and to let us know if it is consistently above 130/80.  I also encouraged a low-sodium diet.  No medication changes today.  Carotid artery stenosis Mild plaquing noted in left ICA on most recently study in 01/2016.  Continue primary prevention, including blood pressure and lipid control.  LDL at goal.  Follow-up: Return to clinic in 1 year.  Nelva Bush, MD 08/05/2017 7:39 AM

## 2017-08-05 ENCOUNTER — Encounter: Payer: Self-pay | Admitting: Internal Medicine

## 2017-08-05 DIAGNOSIS — M79605 Pain in left leg: Secondary | ICD-10-CM

## 2017-08-05 DIAGNOSIS — G8929 Other chronic pain: Secondary | ICD-10-CM | POA: Insufficient documentation

## 2017-11-12 ENCOUNTER — Other Ambulatory Visit (INDEPENDENT_AMBULATORY_CARE_PROVIDER_SITE_OTHER): Payer: Self-pay | Admitting: Otolaryngology

## 2017-11-12 DIAGNOSIS — H918X9 Other specified hearing loss, unspecified ear: Secondary | ICD-10-CM

## 2017-11-21 ENCOUNTER — Other Ambulatory Visit (INDEPENDENT_AMBULATORY_CARE_PROVIDER_SITE_OTHER): Payer: Self-pay | Admitting: Otolaryngology

## 2017-11-21 ENCOUNTER — Ambulatory Visit
Admission: RE | Admit: 2017-11-21 | Discharge: 2017-11-21 | Disposition: A | Discharge status: Home / Self Care | Payer: Medicare Other | Source: Ambulatory Visit | Attending: Otolaryngology | Admitting: Otolaryngology

## 2017-11-21 DIAGNOSIS — H918X9 Other specified hearing loss, unspecified ear: Secondary | ICD-10-CM

## 2017-11-21 MED ORDER — GADOBENATE DIMEGLUMINE 529 MG/ML IV SOLN
15.0000 mL | Freq: Once | INTRAVENOUS | Status: AC | PRN
  Administered 2017-11-21: 15 mL via INTRAVENOUS

## 2018-03-11 ENCOUNTER — Encounter: Payer: Self-pay | Admitting: Cardiology

## 2018-03-11 ENCOUNTER — Ambulatory Visit: Payer: Medicare Other | Admitting: Cardiology

## 2018-03-11 VITALS — BP 132/74 | HR 76 | Ht 66.5 in | Wt 171.8 lb

## 2018-03-11 DIAGNOSIS — I10 Essential (primary) hypertension: Secondary | ICD-10-CM | POA: Diagnosis not present

## 2018-03-11 DIAGNOSIS — E785 Hyperlipidemia, unspecified: Secondary | ICD-10-CM

## 2018-03-11 DIAGNOSIS — I4892 Unspecified atrial flutter: Secondary | ICD-10-CM | POA: Diagnosis not present

## 2018-03-11 NOTE — Progress Notes (Addendum)
Cardiology Office Note:    Date:  03/11/2018   ID:  MALANIA GAWTHROP, DOB 06-05-1945, MRN [id number]  PCP:  Vicenta Aly, [place]  Cardiologist:  Nelva Bush, MD  Electrophysiologist:  None   Referring MD: Vicenta Aly, [place]   Reason for visit:  History of Present Illness:    Marcia Norris is a 73 y.o. female with a hx of SVTs/p AVRT/AVNRTablation, chronic LBBB,HTN, HLD, OSA, and mild carotid artery stenosis, who presents for a follow-up, she used to follow with Dr. and but he now moved to [place].  She states that since the last visit she has been feeling well, [place], and denies any chest pain shortness of breath no claudications no dizziness presyncope or syncope.  She has occasional orthostatic hypotension otherwise no complaints.  She is experiencing mild tingling in her right hand and foot.  She is scheduled to see her neurologist.  Past Medical History:  Diagnosis Date  . Arthritis   . Asthma   . Atrial flutter ([place])   . Diabetes mellitus without complication (HCC)    borderline, diet controlled, no meds  . Diverticulosis   . Exertional shortness of breath   . Heart murmur   . Hyperglycemia   . Hyperlipidemia   . Hypertension   . OSA on CPAP 02/16/2012   wears CPAP machine at night, but takes it off during the night; pt stated "I was told I have mild sleep apnea"  . Seasonal allergies   . Varicose veins   . Vitamin D deficiency     Past Surgical History:  Procedure Laterality Date  . ABDOMINAL HYSTERECTOMY  1990's   partial  . ATRIAL FLUTTER ABLATION  01/27/2013  . ATRIAL FLUTTER ABLATION N/A 01/27/2013   Procedure: ATRIAL FLUTTER ABLATION;  Surgeon: Evans Lance, MD;  Location: [facility] CATH LAB;  Service: Cardiovascular;  Laterality: N/A;  . BACK SURGERY  04/2013  . CATARACT EXTRACTION W/ INTRAOCULAR LENS  IMPLANT, BILATERAL Bilateral 10-12/2012  . COLONOSCOPY    . CYSTOSCOPY     "multiple after bladder tumor removed; released in  2013" (01/27/2013)  . TRANSURETHRAL RESECTION OF BLADDER TUMOR  ~ 2004    Current Medications: Current Meds  Medication Sig  . ALPRAZolam (XANAX) 0.5 MG tablet Take 0.5 mg by mouth at bedtime as needed for sleep.   Marland Kitchen aspirin 81 MG tablet Take 81 mg by mouth every morning.   [name]    Marland Kitchen losartan (COZAAR) 25 MG tablet TAKE 1 TABLET DAILY  . Multiple Vitamins-Minerals (ONE-A-DAY 50 PLUS PO) Take 1 tablet by mouth daily.      Allergies:   Codeine   Social History   Socioeconomic History  . Marital status: Married    Spouse name: Not on file  . Number of children: 2  . Years of education: 13  . Highest education level: Not on file  Occupational History  . Occupation: retired  Scientific laboratory technician  . Financial resource strain: Not on file  . Food insecurity:    Worry: Not on file    Inability: Not on file  . Transportation needs:    Medical: Not on file    Non-medical: Not on file  Tobacco Use  . Smoking status: Never Smoker  . Smokeless tobacco: Never Used  Substance and Sexual Activity  . Alcohol use: Yes    Alcohol/week: 0.0 standard drinks    Comment:  Occasional glass of wine  . Drug use: No  . Sexual activity: Yes  Lifestyle  . Physical activity:    Days per week: Not on file    Minutes per session: Not on file  . Stress: Not on file  Relationships  . Social connections:    Talks on phone: Not on file    Gets together: Not on file    Attends religious service: Not on file    Active member of club or organization: Not on file    Attends meetings of clubs or organizations: Not on file    Relationship status: Not on file  Other Topics Concern  . Not on file  Social History Narrative   Lives at home with her husband.   Right-handed.   1 cups caffeine/day.       Family History: The patient's family history includes Allergies in her mother; Bladder Cancer in her father; Cancer in her mother; Diabetes in her  father and sister; Heart disease in her father; Hyperlipidemia in her father; Hypertension in her father; Kidney disease in her father; Varicose Veins in her mother. There is no history of Colon cancer.  ROS:   Please see the history of present illness.    All other systems reviewed and are negative.  EKGs/Labs/Other Studies Reviewed:    The following studies were reviewed today:  EKG:  EKG is ordered today.  The ekg ordered today demonstrates minus rhythm with left bundle branch block.  Unchanged from prior.  Personally reviewed.  Recent Labs: No results found for requested labs within last 8760 hours.  Recent Lipid Panel No results found for: CHOL, TRIG, HDL, CHOLHDL, VLDL, LDLCALC, LDLDIRECT  Physical Exam:    VS:  BP 132/74   Pulse 76   Ht 5' 6.5" (1.689 m)   Wt 171 lb 12.8 oz (77.9 kg)   SpO2 96%   BMI 27.31 kg/m     Wt Readings from Last 3 Encounters:  03/11/18 171 lb 12.8 oz (77.9 kg)  08/04/17 168 lb (76.2 kg)  07/23/16 174 lb 6.4 oz (79.1 kg)    GEN:  Well nourished, well developed in no acute distress HEENT: Normal NECK: No JVD; No carotid bruits LYMPHATICS: No lymphadenopathy CARDIAC: RRR, no murmurs, rubs, gallops RESPIRATORY:  Clear to auscultation without rales, wheezing or rhonchi  ABDOMEN: Soft, non-tender, non-distended MUSCULOSKELETAL:  No edema; No deformity  SKIN: Warm and dry NEUROLOGIC:  Alert and oriented x 3 PSYCHIATRIC:  Normal affect     ASSESSMENT:    1. Essential hypertension   2. Hyperlipidemia, unspecified hyperlipidemia type   3. Atrial flutter with rapid ventricular response (HCC)    PLAN:    In order of problems listed above:  History of atrial flutter, status post ablation -No recurrent symptoms.  Hypertension  -Well-controlled.  Hyperlipidemia -Followed by primary care physician and atorvastatin well. -I have reviewed labs from her primary care physician and her triglycerides are 110, HDL 47, LDL 75, her LFTs are  normal.  Medication Adjustments/Labs and Tests Ordered: Current medicines are reviewed at length with the patient today.  Concerns regarding medicines are outlined above.  Orders Placed This Encounter  Procedures  . EKG 12-Lead   No orders of the defined types were placed in this encounter.   Patient Instructions  Medication Instructions:   Your physician recommends that you continue on your current medications as directed. Please refer to the Current Medication list given to you today.  If you need a  refill on your cardiac medications before your next appointment, please call your pharmacy.     Follow-Up: At [facility], you and your health needs are our priority.  As part of our continuing mission to provide you with exceptional heart care, we have created designated Provider Care Teams.  These Care Teams include your primary Cardiologist (physician) and Advanced Practice Providers (APPs -  Physician Assistants and Nurse Practitioners) who all work together to provide you with the care you need, when you need it. You will need a follow up appointment in 12 months with Dr [name].  Please call our office 2 months in advance to schedule this appointment.  You may see Dr. [name] or one of the following Advanced Practice Providers on your designated Care Team:   [place], PA-C Melina Copa, PA-C . Ermalinda Barrios, PA-C        Signed, Ena Dawley, MD  03/11/2018 11:16 AM    [place]

## 2018-03-11 NOTE — Patient Instructions (Signed)
Medication Instructions:   Your physician recommends that you continue on your current medications as directed. Please refer to the Current Medication list given to you today.  If you need a refill on your cardiac medications before your next appointment, please call your pharmacy.     Follow-Up: At [facility], you and your health needs are our priority.  As part of our continuing mission to provide you with exceptional heart care, we have created designated Provider Care Teams.  These Care Teams include your primary Cardiologist (physician) and Advanced Practice Providers (APPs -  Physician Assistants and Nurse Practitioners) who all work together to provide you with the care you need, when you need it. You will need a follow up appointment in 12 months with Dr [name].  Please call our office 2 months in advance to schedule this appointment.  You may see Dr. [name] or one of the following Advanced Practice Providers on your designated Care Team:   [place], PA-C Melina Copa, PA-C . Ermalinda Barrios, PA-C

## 2018-04-05 DIAGNOSIS — M5030 Other cervical disc degeneration, unspecified cervical region: Secondary | ICD-10-CM | POA: Insufficient documentation

## 2019-03-03 DIAGNOSIS — C801 Malignant (primary) neoplasm, unspecified: Secondary | ICD-10-CM

## 2019-03-03 HISTORY — DX: Malignant (primary) neoplasm, unspecified: C80.1

## 2019-03-10 DIAGNOSIS — S41002A Unspecified open wound of left shoulder, initial encounter: Secondary | ICD-10-CM | POA: Insufficient documentation

## 2019-03-10 HISTORY — PX: WOUND DEBRIDEMENT: SHX247

## 2019-03-23 ENCOUNTER — Telehealth: Payer: Self-pay

## 2019-03-23 NOTE — Telephone Encounter (Signed)
Attempted to contact pt to obtain consent as well as verify medications, allergies, and pharmacy.

## 2019-03-23 NOTE — Telephone Encounter (Signed)
Patient was read the consent and agrees to the telemedicine visit. She has updated all of her medications on MyChart as well

## 2019-03-23 NOTE — Telephone Encounter (Addendum)
YOUR CARDIOLOGY TEAM HAS ARRANGED FOR AN E-VISIT FOR YOUR APPOINTMENT - PLEASE REVIEW IMPORTANT INFORMATION BELOW SEVERAL DAYS PRIOR TO YOUR APPOINTMENT  Due to the recent COVID-19 pandemic, we are transitioning in-person office visits to tele-medicine visits in an effort to decrease unnecessary exposure to our patients, their families, and staff. These visits are billed to your insurance just like a normal visit is. We also encourage you to sign up for MyChart if you have not already done so. You will need a smartphone if possible. For patients that do not have this, we can still complete the visit using a regular telephone but do prefer a smartphone to enable video when possible. You may have a family member that lives with you that can help. If possible, we also ask that you have a blood pressure cuff and scale at home to measure your blood pressure, heart rate and weight prior to your scheduled appointment. Patients with clinical needs that need an in-person evaluation and testing will still be able to come to the office if absolutely necessary. If you have any questions, feel free to call our office.     YOUR PROVIDER WILL BE USING THE FOLLOWING PLATFORM TO COMPLETE YOUR VISIT:   THE DAY OF YOUR APPOINTMENT  Approximately 15 minutes prior to your scheduled appointment, you will receive a telephone call from one of [place] team - your caller ID may say "Unknown caller."  Our staff will confirm medications, vital signs for the day and any symptoms you may be experiencing. Please have this information available prior to the time of visit start. It may also be helpful for you to have a pad of paper and pen handy for any instructions given during your visit. They will also walk you through joining the smartphone meeting if this is a video visit.    CONSENT FOR TELE-HEALTH VISIT - PLEASE REVIEW  I hereby voluntarily request, consent and authorize [facility] and its employed or contracted  physicians, physician assistants, nurse practitioners or other licensed health care professionals (the Practitioner), to provide me with telemedicine health care services (the "Services") as deemed necessary by the treating Practitioner. I acknowledge and consent to receive the Services by the Practitioner via telemedicine. I understand that the telemedicine visit will involve communicating with the Practitioner through live audiovisual communication technology and the disclosure of certain medical information by electronic transmission. I acknowledge that I have been given the opportunity to request an in-person assessment or other available alternative prior to the telemedicine visit and am voluntarily participating in the telemedicine visit.  I understand that I have the right to withhold or withdraw my consent to the use of telemedicine in the course of my care at any time, without affecting my right to future care or treatment, and that the Practitioner or I may terminate the telemedicine visit at any time. I understand that I have the right to inspect all information obtained and/or recorded in the course of the telemedicine visit and may receive copies of available information for a reasonable fee.  I understand that some of the potential risks of receiving the Services via telemedicine include:  Marland Kitchen Delay or interruption in medical evaluation due to technological equipment failure or disruption; . Information transmitted may not be sufficient (e.g. poor resolution of images) to allow for appropriate medical decision making by the Practitioner; and/or  . In rare instances, security protocols could fail, causing a breach of personal health information.  Furthermore, I acknowledge that it is  my responsibility to provide information about my medical history, conditions and care that is complete and accurate to the best of my ability. I acknowledge that Practitioner's advice, recommendations, and/or decision  may be based on factors not within their control, such as incomplete or inaccurate data provided by me or distortions of diagnostic images or specimens that may result from electronic transmissions. I understand that the practice of medicine is not an exact science and that Practitioner makes no warranties or guarantees regarding treatment outcomes. I acknowledge that I will receive a copy of this consent concurrently upon execution via email to the email address I last provided but may also request a printed copy by calling the office of [facility].    I understand that my insurance will be billed for this visit.   I have read or had this consent read to me. . I understand the contents of this consent, which adequately explains the benefits and risks of the Services being provided via telemedicine.  . I have been provided ample opportunity to ask questions regarding this consent and the Services and have had my questions answered to my satisfaction. . I give my informed consent for the services to be provided through the use of telemedicine in my medical care  By participating in this telemedicine visit I agree to the above.  Patient Agrees

## 2019-03-23 NOTE — Telephone Encounter (Signed)
Updated this in appt notes.

## 2019-03-27 NOTE — Progress Notes (Signed)
Cardiology Telehealth Video Note:    Date:  03/28/2019   ID:  Marcia Norris, DOB 01-Apr-1945, MRN [id number]  PCP:  Vicenta Aly, FNP  Cardiologist:  Ena Dawley, MD  Electrophysiologist: Dr Crissie Sickles  Referring MD: Vicenta Aly, [place]   Reason for visit: 1 year follow up  History of Present Illness:    Marcia Norris is a 74 y.o. female with a hx of SVTs/p AVRT/AVNRTablation, chronic LBBB,HTN, HLD, OSA, and mild carotid artery stenosis, who presents for a follow-up.  She has been doing well, she denies any palpitations, no syncope or falls. She has been experiencing orthostatic hypotension when she gets up or walks across the room however no falls. She admits to not hydrating well. She is very active in summer but lately has not been exercising regularly. She tolerates her medications well.  Past Medical History:  Diagnosis Date  . Arthritis   . Asthma   . Atrial flutter ([place])   . Diabetes mellitus without complication (HCC)    borderline, diet controlled, no meds  . Diverticulosis   . Exertional shortness of breath   . Heart murmur   . Hyperglycemia   . Hyperlipidemia   . Hypertension   . OSA on CPAP 02/16/2012   wears CPAP machine at night, but takes it off during the night; pt stated "I was told I have mild sleep apnea"  . Seasonal allergies   . Varicose veins   . Vitamin D deficiency     Past Surgical History:  Procedure Laterality Date  . ABDOMINAL HYSTERECTOMY  1990's   partial  . ATRIAL FLUTTER ABLATION  01/27/2013  . ATRIAL FLUTTER ABLATION N/A 01/27/2013   Procedure: ATRIAL FLUTTER ABLATION;  Surgeon: Evans Lance, MD;  Location: [facility] CATH LAB;  Service: Cardiovascular;  Laterality: N/A;  . BACK SURGERY  04/2013  . CATARACT EXTRACTION W/ INTRAOCULAR LENS  IMPLANT, BILATERAL Bilateral 10-12/2012  . COLONOSCOPY    . CYSTOSCOPY     "multiple after bladder tumor removed; released in 2013" (01/27/2013)  . TRANSURETHRAL RESECTION OF BLADDER  TUMOR  ~ 2004    Current Medications: No outpatient medications have been marked as taking for the 03/28/19 encounter (Telemedicine) with Dorothy Spark, MD.    Allergies:   Codeine   Social History   Socioeconomic History  . Marital status: Married    Spouse name: Not on file  . Number of children: 2  . Years of education: 40  . Highest education level: Not on file  Occupational History  . Occupation: retired  Tobacco Use  . Smoking status: Never Smoker  . Smokeless tobacco: Never Used  Substance and Sexual Activity  . Alcohol use: Yes    Alcohol/week: 0.0 standard drinks    Comment: Occasional glass of wine  . Drug use: No  . Sexual activity: Yes  Other Topics Concern  . Not on file  Social History Narrative   Lives at home with her husband.   Right-handed.   1 cups caffeine/day.      Social Determinants of Health   Financial Resource Strain:   . Difficulty of Paying Living Expenses: Not on file  Food Insecurity:   . Worried About Charity fundraiser in the Last Year: Not on file  . Ran Out of Food in the Last Year: Not on file  Transportation Needs:   . Lack of Transportation (Medical): Not on file  . Lack of Transportation (Non-Medical): Not on file  Physical Activity:   .  Days of Exercise per Week: Not on file  . Minutes of Exercise per Session: Not on file  Stress:   . Feeling of Stress : Not on file  Social Connections:   . Frequency of Communication with Friends and Family: Not on file  . Frequency of Social Gatherings with Friends and Family: Not on file  . Attends Religious Services: Not on file  . Active Member of Clubs or Organizations: Not on file  . Attends Archivist Meetings: Not on file  . Marital Status: Not on file    Family History: The patient's family history includes Allergies in her mother; Bladder Cancer in her father; Cancer in her mother; Diabetes in her father and sister; Heart disease in her father; Hyperlipidemia in  her father; Hypertension in her father; Kidney disease in her father; Varicose Veins in her mother. There is no history of Colon cancer.  ROS:   Please see the history of present illness.    All other systems reviewed and are negative.  EKGs/Labs/Other Studies Reviewed:    The following studies were reviewed today:  EKG:  EKG is ordered today.  The ekg ordered today demonstrates minus rhythm with left bundle branch block.  Unchanged from prior.  Personally reviewed.  Recent Labs: No results found for requested labs within last 8760 hours.  Recent Lipid Panel No results found for: CHOL, TRIG, HDL, CHOLHDL, VLDL, LDLCALC, LDLDIRECT  Physical Exam:    VS:  BP 126/80   Pulse 94   Wt 168 lb (76.2 kg)   SpO2 96%   BMI 26.71 kg/m     Wt Readings from Last 3 Encounters:  03/28/19 168 lb (76.2 kg)  03/11/18 171 lb 12.8 oz (77.9 kg)  08/04/17 168 lb (76.2 kg)    GEN:  Well nourished, well developed in no acute distress HEENT: Normal NECK: No JVD; No carotid bruits LYMPHATICS: No lymphadenopathy CARDIAC: RRR, no murmurs, rubs, gallops RESPIRATORY:  Clear to auscultation without rales, wheezing or rhonchi  ABDOMEN: Soft, non-tender, non-distended MUSCULOSKELETAL:  No edema; No deformity  SKIN: Warm and dry NEUROLOGIC:  Alert and oriented x 3 PSYCHIATRIC:  Normal affect    ASSESSMENT:    1. Atrial flutter with rapid ventricular response ([place])   2. Essential hypertension   3. Hyperlipidemia, unspecified hyperlipidemia type    PLAN:    In order of problems listed above:  History of atrial flutter, status post ablation -Occasional short flutters.  Hypertension  -Well-controlled with some episodes of orthostatic hypotension, she is advised to hydrate well and start regular exercise 15 to 30 minutes 5 times a week..  Hyperlipidemia -Followed by primary care physician and atorvastatin well. -Her labs from June 2020 show normal kidney liver function, normal hemoglobin, HDL  43, triglycerides 179, LDL 86. Previous triglycerides 110. She is advised about proper diet.  Medication Adjustments/Labs and Tests Ordered: Current medicines are reviewed at length with the patient today.  Concerns regarding medicines are outlined above.  No orders of the defined types were placed in this encounter.  No orders of the defined types were placed in this encounter.   Patient Instructions  Medication Instructions:   Your physician recommends that you continue on your current medications as directed. Please refer to the Current Medication list given to you today.  *If you need a refill on your cardiac medications before your next appointment, please call your pharmacy*    Follow-Up: At [facility], you and your health needs are our priority.  As part of our continuing mission to provide you with exceptional heart care, we have created designated Provider Care Teams.  These Care Teams include your primary Cardiologist (physician) and Advanced Practice Providers (APPs -  Physician Assistants and Nurse Practitioners) who all work together to provide you with the care you need, when you need it.  Your physician wants you to follow-up in: 6 MONTHS WITH DR. Johann Capers will receive a reminder letter in the mail two months in advance. If you don't receive a letter, please call our office to schedule the follow-up appointment.      Signed, Ena Dawley, MD  03/28/2019 8:57 AM    [place]

## 2019-03-28 ENCOUNTER — Encounter: Payer: Self-pay | Admitting: Cardiology

## 2019-03-28 ENCOUNTER — Other Ambulatory Visit: Payer: Self-pay

## 2019-03-28 ENCOUNTER — Encounter: Payer: Self-pay | Admitting: *Deleted

## 2019-03-28 ENCOUNTER — Telehealth (INDEPENDENT_AMBULATORY_CARE_PROVIDER_SITE_OTHER): Payer: Medicare PPO | Admitting: Cardiology

## 2019-03-28 VITALS — BP 126/80 | HR 94 | Wt 168.0 lb

## 2019-03-28 DIAGNOSIS — E785 Hyperlipidemia, unspecified: Secondary | ICD-10-CM

## 2019-03-28 DIAGNOSIS — I4892 Unspecified atrial flutter: Secondary | ICD-10-CM | POA: Diagnosis not present

## 2019-03-28 DIAGNOSIS — I10 Essential (primary) hypertension: Secondary | ICD-10-CM | POA: Diagnosis not present

## 2019-03-28 NOTE — Patient Instructions (Signed)
Medication Instructions:   Your physician recommends that you continue on your current medications as directed. Please refer to the Current Medication list given to you today.  *If you need a refill on your cardiac medications before your next appointment, please call your pharmacy*    Follow-Up: At [facility], you and your health needs are our priority.  As part of our continuing mission to provide you with exceptional heart care, we have created designated Provider Care Teams.  These Care Teams include your primary Cardiologist (physician) and Advanced Practice Providers (APPs -  Physician Assistants and Nurse Practitioners) who all work together to provide you with the care you need, when you need it.  Your physician wants you to follow-up in: 6 MONTHS WITH DR. Johann Capers will receive a reminder letter in the mail two months in advance. If you don't receive a letter, please call our office to schedule the follow-up appointment.

## 2019-05-19 IMAGING — MR MR HEAD WO/W CM
10 series · 48 of 48 positions shown · IV contrast (multihance)
Comparison: None.

ADDENDUM:
The patient returned for dedicated thin slice IAC imaging.
Bilateral cisternal and intracanalicular 7th and 8th cranial nerve
segments are within normal limits. Symmetric and normal appearing T2
signal in the bilateral cochlea and vestibular structures. No
asymmetric or abnormal enhancement identified.
CLINICAL DATA: 72-year-old female with dizziness for 4 weeks,
ringing in ears. Asymmetric hearing loss.

EXAM:
MRI HEAD WITHOUT AND WITH CONTRAST
TECHNIQUE: Multiplanar, multiecho pulse sequences of the brain and surrounding
structures were obtained without and with intravenous contrast.
CONTRAST:  15mL MULTIHANCE GADOBENATE DIMEGLUMINE 529 MG/ML IV SOLN

[Series 2: T1 · sagittal · 5.0mm · 0.90mm/px · 1 of 27 slices shown (1 of 2)]
[im 1/27]
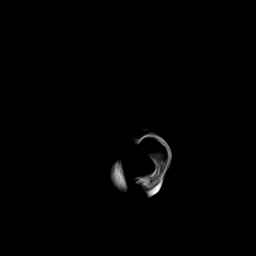

[Series 3: DWI · axial · 3.0mm · 1.88mm/px · z∈[-51,+102]mm · 9 of 95 slices shown (1 of 2)]
[im 1/95]
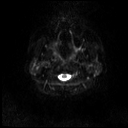
[im 12/95]
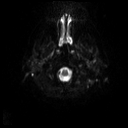
[im 24/95]
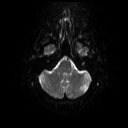
[im 36/95]
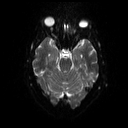
[im 48/95]
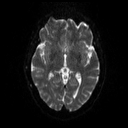
[im 59/95]
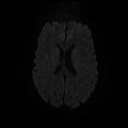
[im 71/95]
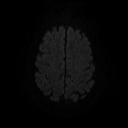
[im 83/95]
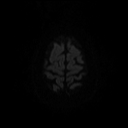
[im 95/95]
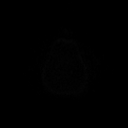

[Series 4: DWI · axial · 3.0mm · 1.88mm/px · z∈[-51,+102]mm · 4 of 48 slices shown (2 of 2)]
[im 1/48]
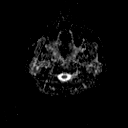
[im 16/48]
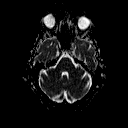
[im 32/48]
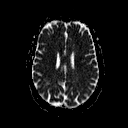
[im 48/48]
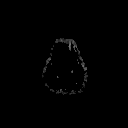

[Series 5: T2 · axial · 5.0mm · 0.69mm/px · z∈[-56,+103]mm · 3 of 28 slices shown (1 of 3)]
[im 1/28]
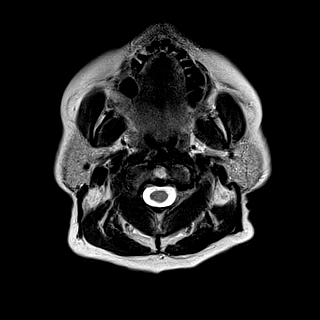
[im 14/28]
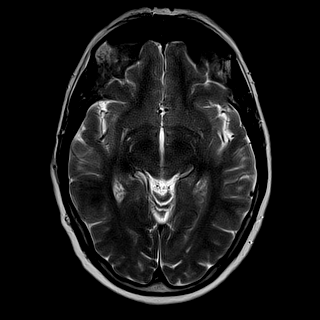
[im 28/28]
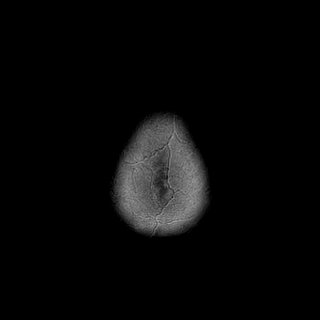

[Series 6: T2 · axial · 5.0mm · 0.43mm/px · z∈[-56,+103]mm · 3 of 28 slices shown (2 of 3)]
[im 1/28]
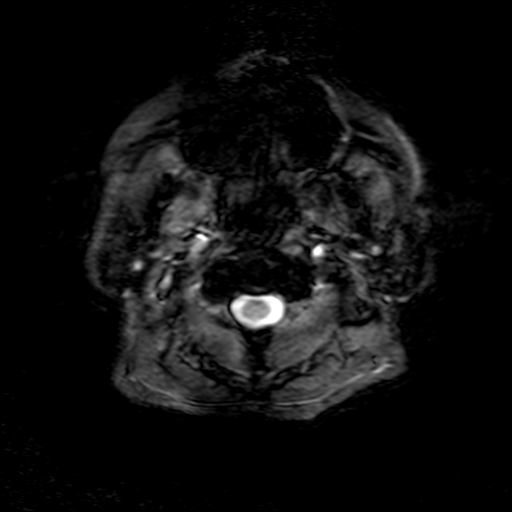
[im 14/28]
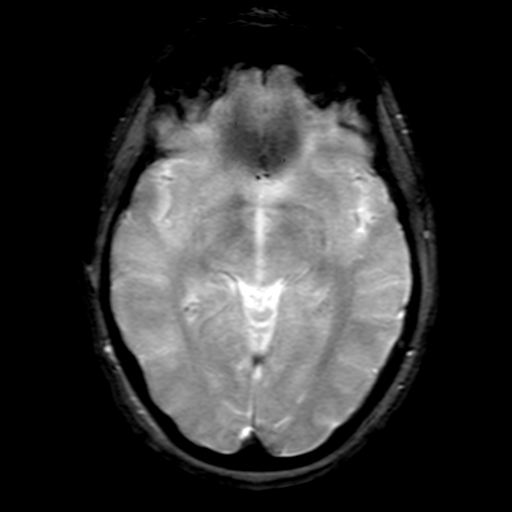
[im 28/28]
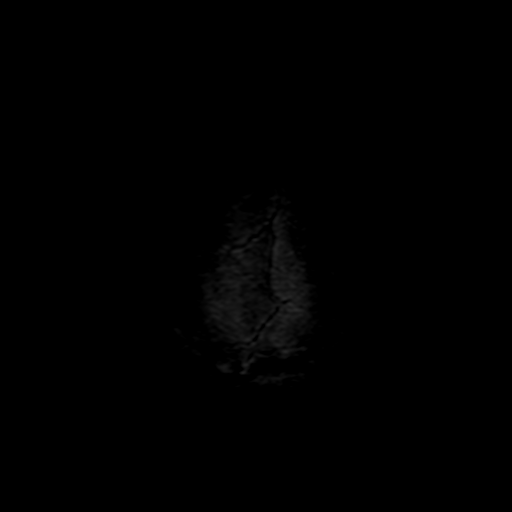

[Series 8: t1_3d_tra · axial · 2.0mm · 0.94mm/px · z∈[-64,+123]mm · 9 of 96 slices shown]
[im 1/96]
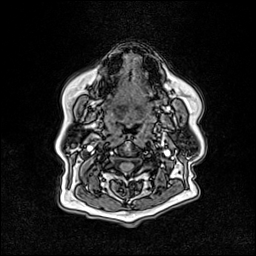
[im 12/96]
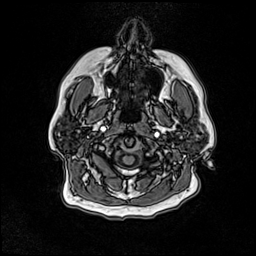
[im 24/96]
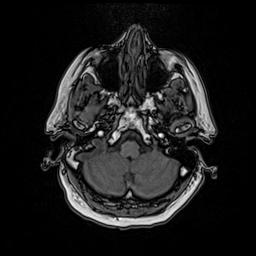
[im 36/96]
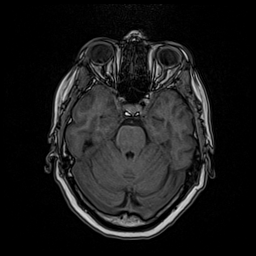
[im 48/96]
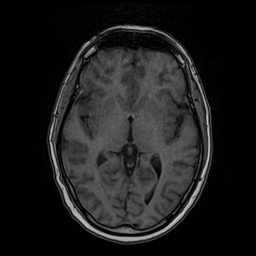
[im 60/96]
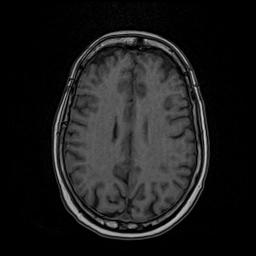
[im 72/96]
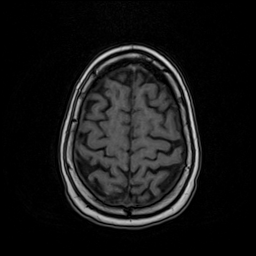
[im 84/96]
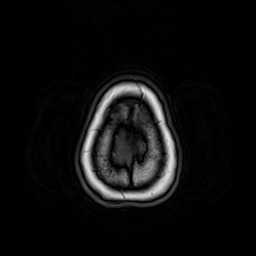
[im 96/96]
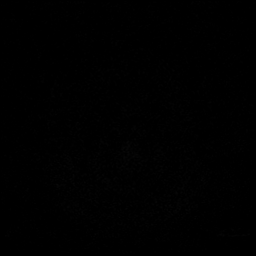

[Series 9: FLAIR · axial · 3.0mm · 0.86mm/px · z∈[-57,+104]mm · 4 of 42 slices shown]
[im 1/42]
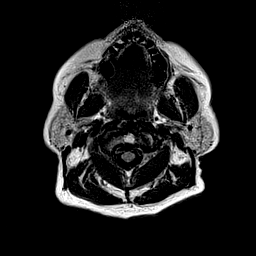
[im 14/42]
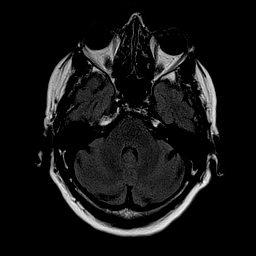
[im 28/42]
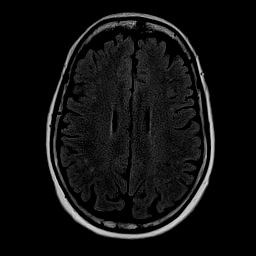
[im 42/42]
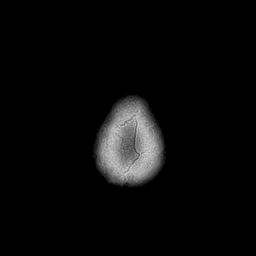

[Series 10: T2 · coronal · 5.0mm · 0.69mm/px · 3 of 30 slices shown (3 of 3)]
[im 1/30]
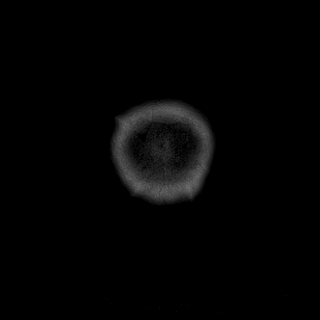
[im 15/30]
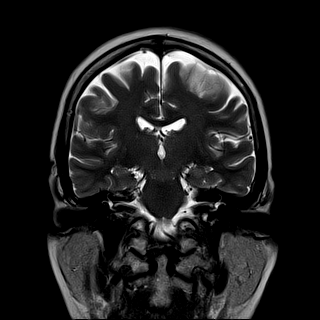
[im 30/30]
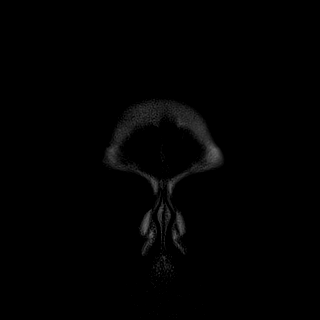

[Series 11: t1_3d_tra +c · axial · 2.0mm · 0.94mm/px · z∈[-64,+123]mm · 9 of 96 slices shown]
[im 1/96]
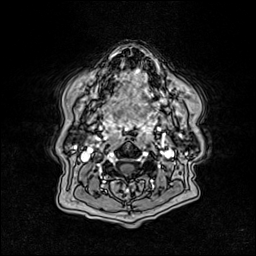
[im 12/96]
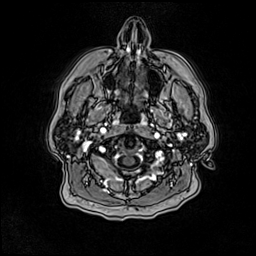
[im 24/96]
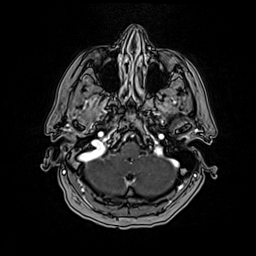
[im 36/96]
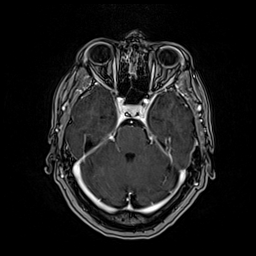
[im 48/96]
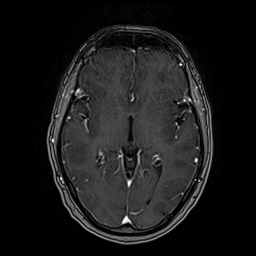
[im 60/96]
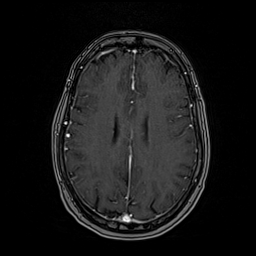
[im 72/96]
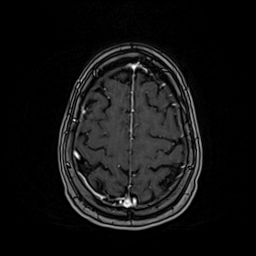
[im 84/96]
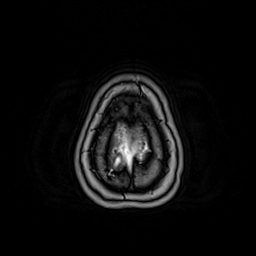
[im 96/96]
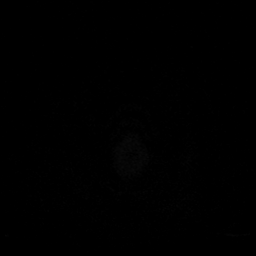

[Series 12: T1 · coronal · 5.0mm · 0.86mm/px · 3 of 30 slices shown (2 of 2)]
[im 1/30]
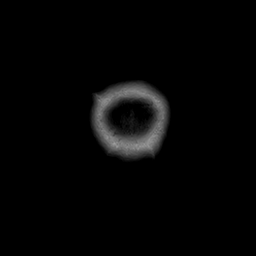
[im 15/30]
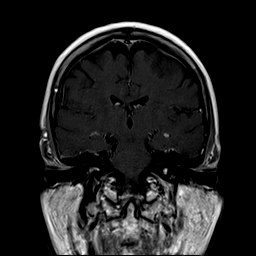
[im 30/30]
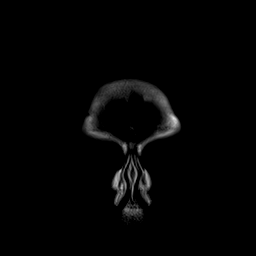

[48 of 48 positions shown; findings below may reference images not displayed]

FINDINGS: Brain: Cerebral volume is within normal limits for age. No
restricted diffusion to suggest acute infarction. No midline shift,
mass effect, evidence of mass lesion, ventriculomegaly, extra-axial
collection or acute intracranial hemorrhage. Cervicomedullary
junction and pituitary are within normal limits.

Minimal to mild for age scattered nonspecific cerebral white matter
T2 and FLAIR hyperintensity. No cortical encephalomalacia. No
chronic cerebral blood products. The deep gray matter nuclei,
brainstem, and cerebellum appear normal.

No abnormal enhancement identified.  No dural thickening.

Vascular: Major intracranial vascular flow voids are preserved. The
major dural venous sinuses are enhancing and appear patent.

Skull and upper cervical spine: Negative visible cervical spine;
mild degenerative ligamentous hypertrophy about the odontoid. Normal
bone marrow signal.

Sinuses/Orbits: Postoperative changes to both globes, otherwise
normal orbits soft tissues. The paranasal sinuses are clear.

Other: Dedicated thin slice T2 weighted/steady state IAC imaging was
not performed. The cerebellopontine angles and internal auditory
structures appear symmetric and within normal limits. There is no
abnormal enhancement on axial or coronal postcontrast images.
Bilateral mastoid air cells are clear. The stylomastoid foramina
appear normal. Parotid glands appear symmetric and normal.

Other visible scalp and face soft tissues appear negative.
IMPRESSION: 1. No acute intracranial abnormality and essentially normal for age
MRI appearance of the brain.
2. Visible internal auditory structures appear unremarkable,
although additional dedicated thin slice IAC protocol imaging is
being requested and will be reported as an addendum.

## 2019-07-12 DIAGNOSIS — L905 Scar conditions and fibrosis of skin: Secondary | ICD-10-CM | POA: Insufficient documentation

## 2019-08-01 DIAGNOSIS — Z859 Personal history of malignant neoplasm, unspecified: Secondary | ICD-10-CM

## 2019-08-01 DIAGNOSIS — Z9889 Other specified postprocedural states: Secondary | ICD-10-CM

## 2019-08-01 HISTORY — DX: Other specified postprocedural states: Z98.890

## 2019-08-01 HISTORY — DX: Other specified postprocedural states: Z85.9

## 2019-08-04 DIAGNOSIS — Z8679 Personal history of other diseases of the circulatory system: Secondary | ICD-10-CM | POA: Insufficient documentation

## 2019-08-04 DIAGNOSIS — E119 Type 2 diabetes mellitus without complications: Secondary | ICD-10-CM | POA: Insufficient documentation

## 2019-08-17 HISTORY — PX: REVISION OF SCAR: SHX2348

## 2019-09-01 ENCOUNTER — Other Ambulatory Visit: Payer: Self-pay

## 2019-09-01 ENCOUNTER — Ambulatory Visit: Payer: Medicare PPO | Admitting: Cardiology

## 2019-09-01 VITALS — BP 156/78 | HR 64 | Ht 66.5 in | Wt 168.0 lb

## 2019-09-01 DIAGNOSIS — E782 Mixed hyperlipidemia: Secondary | ICD-10-CM | POA: Diagnosis not present

## 2019-09-01 DIAGNOSIS — R600 Localized edema: Secondary | ICD-10-CM

## 2019-09-01 DIAGNOSIS — I10 Essential (primary) hypertension: Secondary | ICD-10-CM

## 2019-09-01 DIAGNOSIS — I4892 Unspecified atrial flutter: Secondary | ICD-10-CM

## 2019-09-01 MED ORDER — HYDROCHLOROTHIAZIDE 12.5 MG PO CAPS: 12.5000 mg | ORAL_CAPSULE | Freq: Every day | ORAL | 3 refills | Status: DC

## 2019-09-01 MED ORDER — VALSARTAN 80 MG PO TABS: 80.0000 mg | ORAL_TABLET | Freq: Every day | ORAL | 3 refills | Status: DC

## 2019-09-01 NOTE — Patient Instructions (Addendum)
Medication Instructions:  Your physician has recommended you make the following change in your medication:  1) STOP taking losartan (Cozaar) 2) START taking valsartan (Diovan) 80 mg daily 3) START taking hydrochlorothiazide 12.5 mg daily  *If you need a refill on your cardiac medications before your next appointment, please call your pharmacy*  Follow-Up: At [facility], you and your health needs are our priority.  As part of our continuing mission to provide you with exceptional heart care, we have created designated Provider Care Teams.  These Care Teams include your primary Cardiologist (physician) and Advanced Practice Providers (APPs -  Physician Assistants and Nurse Practitioners) who all work together to provide you with the care you need, when you need it.  Your next appointment:   6 month(s)  The format for your next appointment:   In Person  Provider:   You may see Ena Dawley, MD or one of the following Advanced Practice Providers on your designated Care Team:    Melina Copa, PA-C  Ermalinda Barrios, PA-C

## 2019-09-01 NOTE — Progress Notes (Signed)
Cardiology Office Note:    Date:  09/01/2019   ID:  Marcia Norris, DOB [birth year], MRN [id number]  PCP:  Vicenta Aly, [place]  Cardiologist:  Ena Dawley, MD  Electrophysiologist:  None   Referring MD: Vicenta Aly, [place]   Reason for visit: 6 months follow-up  History of Present Illness:    Marcia Norris is a 74 y.o. female with a hx of SVTs/p AVRT/AVNRTablation, chronic LBBB,HTN, HLD, OSA, and mild carotid artery stenosis, who presents for a follow-up, she used to follow with Dr. and but he now moved to [place].  She states that since the last visit she has been feeling well, [place], and denies any chest pain shortness of breath no claudications no dizziness presyncope or syncope.  She has occasional orthostatic hypotension otherwise no complaints.  She is experiencing mild tingling in her right hand and foot.  She is scheduled to see her neurologist.  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  She denies any chest pain or shortness of breath no palpitation dizziness or syncope.  She has noticed mild lower extremity edema around her ankles predominantly especially toward the end of days.  Past Medical History:  Diagnosis Date  . Arthritis   . Asthma   . Atrial flutter ([place])   . Diabetes mellitus without complication (HCC)    borderline, diet controlled, no meds  . Diverticulosis   . Exertional shortness of breath   . Heart murmur   . Hyperglycemia   . Hyperlipidemia   . Hypertension   . OSA on CPAP 02/16/2012   wears CPAP machine at night, but takes it off during the night; pt stated "I was told I have mild sleep apnea"  . Seasonal allergies   . Varicose veins   . Vitamin D deficiency    Past Surgical History:  Procedure Laterality Date  . ABDOMINAL HYSTERECTOMY  1990's   partial  . ATRIAL FLUTTER ABLATION  01/27/2013  . ATRIAL FLUTTER  ABLATION N/A 01/27/2013   Procedure: ATRIAL FLUTTER ABLATION;  Surgeon: Evans Lance, MD;  Location: [facility] CATH LAB;  Service: Cardiovascular;  Laterality: N/A;  . BACK SURGERY  04/2013  . CATARACT EXTRACTION W/ INTRAOCULAR LENS  IMPLANT, BILATERAL Bilateral 10-12/2012  . COLONOSCOPY    . CYSTOSCOPY     "multiple after bladder tumor removed; released in 2013" (01/27/2013)  . TRANSURETHRAL RESECTION OF BLADDER TUMOR  ~ 2004   Current Medications: Current Meds  Medication Sig  . ALPRAZolam (XANAX) 0.5 MG tablet Take 0.5 mg by mouth at bedtime as needed for sleep.   Marland Kitchen aspirin 81 MG tablet Take 81 mg by mouth every morning.   [name]    . metFORMIN (GLUCOPHAGE) 500 MG tablet Take 500 mg by mouth as directed. 1 tab in AM and 2 tabs in the PM  . Multiple Vitamins-Minerals (ONE-A-DAY 50 PLUS PO) Take 1 tablet by mouth daily.    . [DISCONTINUED] losartan (COZAAR) 25 MG tablet TAKE 1 TABLET DAILY    Allergies:   Codeine   Social History   Socioeconomic History  . Marital status: Married    Spouse name: Not on file  . Number of children: 2  . Years of education: 80  . Highest education level: Not on file  Occupational History  . Occupation: retired  Tobacco Use  . Smoking status: Never Smoker  . Smokeless tobacco: Never Used  Vaping Use  . Vaping Use: Never used  Substance and Sexual Activity  . Alcohol use: Yes    Alcohol/week: 0.0 standard drinks    Comment: Occasional glass of wine  . Drug use: No  . Sexual activity: Yes  Other Topics Concern  . Not on file  Social History Narrative   Lives at home with her husband.   Right-handed.   1 cups caffeine/day.      Social Determinants of Health   Financial Resource Strain:   . Difficulty of Paying Living Expenses:   Food Insecurity:   . Worried About Charity fundraiser in the Last Year:   . Arboriculturist in the Last Year:   Transportation Needs:   . Lexicographer (Medical):   Marland Kitchen Lack of Transportation (Non-Medical):   Physical Activity:   . Days of Exercise per Week:   . Minutes of Exercise per Session:   Stress:   . Feeling of Stress :   Social Connections:   . Frequency of Communication with Friends and Family:   . Frequency of Social Gatherings with Friends and Family:   . Attends Religious Services:   . Active Member of Clubs or Organizations:   . Attends Archivist Meetings:   Marland Kitchen Marital Status:     Family History: The patient's family history includes Allergies in her mother; Bladder Cancer in her father; Cancer in her mother; Diabetes in her father and sister; Heart disease in her father; Hyperlipidemia in her father; Hypertension in her father; Kidney disease in her father; Varicose Veins in her mother. There is no history of Colon cancer.  ROS:   Please see the history of present illness.    All other systems reviewed and are negative.  EKGs/Labs/Other Studies Reviewed:    The following studies were reviewed today:  EKG:  EKG is ordered today.  The ekg ordered today demonstrates minus rhythm with left bundle branch block.  Unchanged from prior.  Personally reviewed.  Recent Labs: No results found for requested labs within last 8760 hours.  Recent Lipid Panel No results found for: CHOL, TRIG, HDL, CHOLHDL, VLDL, LDLCALC, LDLDIRECT  Physical Exam:    VS:  BP (!) 156/78   Pulse 64   Ht 5' 6.5" (1.689 m)   Wt 168 lb (76.2 kg)   BMI 26.71 kg/m     Wt Readings from Last 3 Encounters:  09/01/19 168 lb (76.2 kg)  03/28/19 168 lb (76.2 kg)  03/11/18 171 lb 12.8 oz (77.9 kg)    GEN:  Well nourished, well developed in no acute distress HEENT: Normal NECK: No JVD; No carotid bruits LYMPHATICS: No lymphadenopathy CARDIAC: RRR, no murmurs, rubs, gallops RESPIRATORY:  Clear to auscultation without rales, wheezing or rhonchi  ABDOMEN: Soft, non-tender, non-distended MUSCULOSKELETAL:  No edema; No deformity   SKIN: Warm and dry NEUROLOGIC:  Alert and oriented x 3 PSYCHIATRIC:  Normal affect    ASSESSMENT:    1. Atrial flutter with rapid ventricular response ([place])   2. Essential hypertension   3. Mixed hyperlipidemia   4. Lower extremity edema      PLAN:    In order of problems listed above:  History of atrial flutter, status post ablation -Asymptomatic, last EKG on August 07, 2019 sinus rhythm with left bundle branch block, unchanged from prior.  Hypertension  -Uncontrolled, I will discontinue losartan 25  mg daily and start valsartan 40 mg daily and add hydrochlorothiazide 12.5 mg daily for lower extremity edema.  Lower extremity edema -Start hydrochlorothiazide 12.5 mg daily that would also help with her blood pressure.  Recent labs from April 2021 show creatinine 1.16 and potassium 4.4.  Hyperlipidemia -Followed by primary care physician and atorvastatin well. -Recent labs in April 2021 show triglycerides 128, HDL 47, LDL 68, normal LFTs.  Medication Adjustments/Labs and Tests Ordered: Current medicines are reviewed at length with the patient today.  Concerns regarding medicines are outlined above.  Orders Placed This Encounter  Procedures  . EKG 12-Lead   Meds ordered this encounter  Medications  . valsartan (DIOVAN) 80 MG tablet    Sig: Take 1 tablet (80 mg total) by mouth daily.    Dispense:  90 tablet    Refill:  3  . hydrochlorothiazide (MICROZIDE) 12.5 MG capsule    Sig: Take 1 capsule (12.5 mg total) by mouth daily.    Dispense:  90 capsule    Refill:  3    Patient Instructions  Medication Instructions:  Your physician has recommended you make the following change in your medication:  1) STOP taking losartan (Cozaar) 2) START taking valsartan (Diovan) 80 mg daily 3) START taking hydrochlorothiazide 12.5 mg daily  *If you need a refill on your cardiac medications before your next appointment, please call your pharmacy*  Follow-Up: At [facility], you  and your health needs are our priority.  As part of our continuing mission to provide you with exceptional heart care, we have created designated Provider Care Teams.  These Care Teams include your primary Cardiologist (physician) and Advanced Practice Providers (APPs -  Physician Assistants and Nurse Practitioners) who all work together to provide you with the care you need, when you need it.  Your next appointment:   6 month(s)  The format for your next appointment:   In Person  Provider:   You may see Ena Dawley, MD or one of the following Advanced Practice Providers on your designated Care Team:    Melina Copa, PA-C  Ermalinda Barrios, PA-C       Signed, Ena Dawley, MD  09/01/2019 8:58 AM    [place]

## 2020-04-01 DIAGNOSIS — M19011 Primary osteoarthritis, right shoulder: Secondary | ICD-10-CM | POA: Insufficient documentation

## 2020-05-15 NOTE — Progress Notes (Unsigned)
Cardiology Office Note   Date:  05/17/2020   ID:  SAIRA KRAMME, DOB [birth year], MRN [id number]  PCP:  Vicenta Aly, [place]  Cardiologist: Dr. [name] CC: Pre-Operative Cardiac Evaluation    History of Present Illness: Marcia Norris is a very pleasant  75 y.o. female who presents for ongoing assessment and management of history of SVT, status post AVRT/AVR NT ablation, chronic left bundle branch block, hypertension, hyperlipidemia, mild carotid artery stenosis, with history of OSA.  Was last seen by Dr. [name] on 09/01/2019 and was doing well without any cardiac complaints.  It was noted on that office visit that her blood pressure was not well controlled.  Losartan was discontinued and she was started on valsartan 40 mg daily with addition of hydrochlorothiazide 12.5 mg daily.  This would also help with some mild lower extremity edema noted on assessment.  She is also here for preoperative cardiac clearance as she is having planned reverse shoulder arthroplasty by Dr. Justice Britain.  She is due to have surgery on May 30, 2020.  She comes today without any cardiac complaints with the exception of lower extremity edema which has been chronic for her.  She does have significant varicosities.  She likes to work in her yard and is looking forward to being able to do so once her shoulder is repaired as the weather has gotten better.  Her husband has COPD and she often has to slow down to walk with hands but normally is very active.  She denies any chest pain, heart fluttering, rapid heart rate, dizziness, near syncope or dyspnea on exertion.  She has stopped HCTZ that was prescribed in the past that she was having a lot of dizziness and hypotension associated.  This has increased incidences of lower extremity edema at times.  Past Medical History:  Diagnosis Date  . Arthritis   . Asthma   . Atrial flutter ([place])   . Diabetes mellitus without complication (HCC)    borderline, diet controlled, no  meds  . Diverticulosis   . Exertional shortness of breath   . Heart murmur   . Hyperglycemia   . Hyperlipidemia   . Hypertension   . OSA on CPAP 02/16/2012   wears CPAP machine at night, but takes it off during the night; pt stated "I was told I have mild sleep apnea"  . Seasonal allergies   . Varicose veins   . Vitamin D deficiency     Past Surgical History:  Procedure Laterality Date  . ABDOMINAL HYSTERECTOMY  1990's   partial  . ATRIAL FLUTTER ABLATION  01/27/2013  . ATRIAL FLUTTER ABLATION N/A 01/27/2013   Procedure: ATRIAL FLUTTER ABLATION;  Surgeon: Evans Lance, MD;  Location: [facility] CATH LAB;  Service: Cardiovascular;  Laterality: N/A;  . BACK SURGERY  04/2013  . CATARACT EXTRACTION W/ INTRAOCULAR LENS  IMPLANT, BILATERAL Bilateral 10-12/2012  . COLONOSCOPY    . CYSTOSCOPY     "multiple after bladder tumor removed; released in 2013" (01/27/2013)  . TRANSURETHRAL RESECTION OF BLADDER TUMOR  ~ 2004     Current Outpatient Medications  Medication Sig Dispense Refill  . ALPRAZolam (XANAX) 0.5 MG tablet Take 0.25 mg by mouth at bedtime as needed for sleep.    Marland Kitchen aspirin 81 MG tablet Take 81 mg by mouth every morning.    Marland Kitchen atorvastatin (LIPITOR) 40 MG tablet Take 40 mg by mouth every evening.    . Cholecalciferol (VITAMIN D3 MAXIMUM STRENGTH) 125 MCG (5000 UT) capsule  Take 5,000 Units by mouth daily.    . metFORMIN (GLUCOPHAGE) 500 MG tablet Take 500-1,000 mg by mouth See admin instructions. Take 500 mg in the morning and 1000 mg in the evening    . Multiple Vitamins-Minerals (ONE-A-DAY 50 PLUS PO) Take 1 tablet by mouth 3 (three) times a week.    . valsartan (DIOVAN) 80 MG tablet Take 1 tablet (80 mg total) by mouth daily. (Patient taking differently: Take 80 mg by mouth at bedtime.) 90 tablet 3   No current facility-administered medications for this visit.    Allergies:   Latex and Codeine    Social History:  The patient  reports that she has never smoked. She has never  used smokeless tobacco. She reports current alcohol use. She reports that she does not use drugs.   Family History:  The patient's family history includes Allergies in her mother; Bladder Cancer in her father; Cancer in her mother; Diabetes in her father and sister; Heart disease in her father; Hyperlipidemia in her father; Hypertension in her father; Kidney disease in her father; Varicose Veins in her mother.    ROS: All other systems are reviewed and negative. Unless otherwise mentioned in H&P    PHYSICAL EXAM: VS:  BP 128/72   Pulse 84   Ht 5' 6.5" (1.689 m)   Wt 170 lb 3.2 oz (77.2 kg)   SpO2 97%   BMI 27.06 kg/m  , BMI Body mass index is 27.06 kg/m. GEN: Well nourished, well developed, in no acute distress HEENT: normal Neck: no JVD, carotid bruits, or masses Cardiac: RRR; 2/6 systolic murmurs, heard best at the right sternal border with some radiation into carotids, no rubs, or gallops,no edema significant varicosities in the lower extremity with mild 1+ dependent edema. Respiratory:  Clear to auscultation bilaterally, normal work of breathing GI: soft, nontender, nondistended, + BS MS: no deformity or atrophy unable to lift right arm above shoulder without pain.  No decrease in sensation or ataxia noted Skin: warm and dry, no rash Neuro:  Strength and sensation are intact Psych: euthymic mood, full affect   EKG: Sinus rhythm with left bundle branch block heart rate of 84 bpm (personally reviewed no change from prior EKG)  Recent Labs: No results found for requested labs within last 8760 hours.    Lipid Panel No results found for: CHOL, TRIG, HDL, CHOLHDL, VLDL, LDLCALC, LDLDIRECT    Wt Readings from Last 3 Encounters:  05/17/20 170 lb 3.2 oz (77.2 kg)  09/01/19 168 lb (76.2 kg)  03/28/19 168 lb (76.2 kg)      Other studies Reviewed:  Echocardiogram 08/04/2016  Left ventricle: The cavity size was normal. There was mild focal  basal hypertrophy of the septum.  Systolic function was normal.  The estimated ejection fraction was in the range of 55% to 60%.  Wall motion was normal; there were no regional wall motion  abnormalities. Doppler parameters are consistent with abnormal  left ventricular relaxation (grade 1 diastolic dysfunction).  - Ventricular septum: Septal motion showed paradox. These changes  are consistent with a left bundle branch block.  - Mitral valve: There was mild regurgitation directed centrally.    ASSESSMENT AND PLAN:  1.  Preop cardiac evaluation: She is doing well.  Chart reviewed as part of pre-operative protocol coverage. Given past medical history and time since last visit, based on ACC/AHA guidelines, Solae Norling Ihnen would be at acceptable risk for the planned procedure without further cardiovascular testing. This  will be route this recommendation to the requesting party via [facility] fax function and remove from pre-op pool.Please call with questions.  2. Aortic Murmur: This apparently has been longstanding and identified by Dr. Lia Foyer when he was following her in the past.  She has not had an echocardiogram since 2008, there was no mitral regurgitation or stenosis noted on that echo.  Due to the prominent aortic valve murmur I am going to order an echocardiogram for further evaluation of aortic valve.  Due to the radiation in his carotid artery most likely related to the aortic valve but with age and comorbidities I will also check a carotid ultrasound to be thorough.  No changes are made on her medication regimen at this time.: Continue medications as directed.  She continues to have some lower extremity edema likely related to varicosities.  She may use HCTZ as needed but watch for lower blood pressures or dizziness associated with its use.  She is unable to tolerate this daily.  I have advised her to wear support hose as had been advised to her in the past.  She verbalizes understanding.  4.  Type 2 diabetes: Being  followed by PCP and is due to see them within the month for labs.  She is also getting labs completed for preoperative clearance as well.  She will need to be established with Dr. Johney Frame on follow-up as her primary cardiologist, Dr. [name], is relocating.  Current medicines are reviewed at length with the patient today.  I have spent 25 minutes dedicated to the care of this patient on the date of this encounter to include pre-visit review of records, assessment, management and diagnostic testing,with shared decision making.  Labs/ tests ordered today include: Echo, carotid ultrasound.   Phill Myron. West Pugh, ANP, AACC   05/17/2020 9:25 AM    [place] [address] Office [phone] Fax [phone]  Notice: This dictation was prepared with Dragon dictation along with smaller phrase technology. Any transcriptional errors that result from this process are unintentional and may not be corrected upon review.

## 2020-05-17 ENCOUNTER — Ambulatory Visit: Payer: Medicare PPO | Admitting: Adult Health

## 2020-05-17 ENCOUNTER — Other Ambulatory Visit: Payer: Self-pay

## 2020-05-17 ENCOUNTER — Encounter: Payer: Self-pay | Admitting: Adult Health

## 2020-05-17 VITALS — BP 128/72 | HR 84 | Ht 66.5 in | Wt 170.2 lb

## 2020-05-17 DIAGNOSIS — I6522 Occlusion and stenosis of left carotid artery: Secondary | ICD-10-CM

## 2020-05-17 DIAGNOSIS — Z01818 Encounter for other preprocedural examination: Secondary | ICD-10-CM

## 2020-05-17 DIAGNOSIS — I351 Nonrheumatic aortic (valve) insufficiency: Secondary | ICD-10-CM

## 2020-05-17 DIAGNOSIS — E782 Mixed hyperlipidemia: Secondary | ICD-10-CM

## 2020-05-17 DIAGNOSIS — R0989 Other specified symptoms and signs involving the circulatory and respiratory systems: Secondary | ICD-10-CM

## 2020-05-17 DIAGNOSIS — I4892 Unspecified atrial flutter: Secondary | ICD-10-CM | POA: Diagnosis not present

## 2020-05-17 DIAGNOSIS — I10 Essential (primary) hypertension: Secondary | ICD-10-CM

## 2020-05-17 NOTE — Patient Instructions (Signed)
Medication Instructions:  Your physician recommends that you continue on your current medications as directed. Please refer to the Current Medication list given to you today.  *If you need a refill on your cardiac medications before your next appointment, please call your pharmacy*   Lab Work: None ordered  If you have labs (blood work) drawn today and your tests are completely normal, you will receive your results only by: Marland Kitchen MyChart Message (if you have MyChart) OR . A paper copy in the mail If you have any lab test that is abnormal or we need to change your treatment, we will call you to review the results.   Testing/Procedures: Your physician has requested that you have an echocardiogram. Echocardiography is a painless test that uses sound waves to create images of your heart. It provides your doctor with information about the size and shape of your heart and how well your heart's chambers and valves are working. This procedure takes approximately one hour. There are no restrictions for this procedure.   Your physician has requested that you have a carotid duplex. This test is an ultrasound of the carotid arteries in your neck. It looks at blood flow through these arteries that supply the brain with blood. Allow one hour for this exam. There are no restrictions or special instructions.     Follow-Up: At [facility], you and your health needs are our priority.  As part of our continuing mission to provide you with exceptional heart care, we have created designated Provider Care Teams.  These Care Teams include your primary Cardiologist (physician) and Advanced Practice Providers (APPs -  Physician Assistants and Nurse Practitioners) who all work together to provide you with the care you need, when you need it.  We recommend signing up for the patient portal called "MyChart".  Sign up information is provided on this After Visit Summary.  MyChart is used to connect with patients for  Virtual Visits (Telemedicine).  Patients are able to view lab/test results, encounter notes, upcoming appointments, etc.  Non-urgent messages can be sent to your provider as well.   To learn more about what you can do with MyChart, go to [email or web address].    Your next appointment:   6 week(s)  The format for your next appointment:   In Person  Provider:   Gwyndolyn Kaufman, MD  ([place] transfer)   Other Instructions  Echocardiogram An echocardiogram is a test that uses sound waves (ultrasound) to produce images of the heart. Images from an echocardiogram can provide important information about:  Heart size and shape.  The size and thickness and movement of your heart's walls.  Heart muscle function and strength.  Heart valve function or if you have stenosis. Stenosis is when the heart valves are too narrow.  If blood is flowing backward through the heart valves (regurgitation).  A tumor or infectious growth around the heart valves.  Areas of heart muscle that are not working well because of poor blood flow or injury from a heart attack.  Aneurysm detection. An aneurysm is a weak or damaged part of an artery wall. The wall bulges out from the normal force of blood pumping through the body. Tell a health care provider about:  Any allergies you have.  All medicines you are taking, including vitamins, herbs, eye drops, creams, and over-the-counter medicines.  Any blood disorders you have.  Any surgeries you have had.  Any medical conditions you have.  Whether you are pregnant or may be pregnant.  What are the risks? Generally, this is a safe test. However, problems may occur, including an allergic reaction to dye (contrast) that may be used during the test. What happens before the test? No specific preparation is needed. You may eat and drink normally. What happens during the test?  You will take off your clothes from the waist up and put on a hospital  gown.  Electrodes or electrocardiogram (ECG)patches may be placed on your chest. The electrodes or patches are then connected to a device that monitors your heart rate and rhythm.  You will lie down on a table for an ultrasound exam. A gel will be applied to your chest to help sound waves pass through your skin.  A handheld device, called a transducer, will be pressed against your chest and moved over your heart. The transducer produces sound waves that travel to your heart and bounce back (or "echo" back) to the transducer. These sound waves will be captured in real-time and changed into images of your heart that can be viewed on a video monitor. The images will be recorded on a computer and reviewed by your health care provider.  You may be asked to change positions or hold your breath for a short time. This makes it easier to get different views or better views of your heart.  In some cases, you may receive contrast through an IV in one of your veins. This can improve the quality of the pictures from your heart. The procedure may vary among health care providers and hospitals.   What can I expect after the test? You may return to your normal, everyday life, including diet, activities, and medicines, unless your health care provider tells you not to do that. Follow these instructions at home:  It is up to you to get the results of your test. Ask your health care provider, or the department that is doing the test, when your results will be ready.  Keep all follow-up visits. This is important. Summary  An echocardiogram is a test that uses sound waves (ultrasound) to produce images of the heart.  Images from an echocardiogram can provide important information about the size and shape of your heart, heart muscle function, heart valve function, and other possible heart problems.  You do not need to do anything to prepare before this test. You may eat and drink normally.  After the  echocardiogram is completed, you may return to your normal, everyday life, unless your health care provider tells you not to do that. This information is not intended to replace advice given to you by your health care provider. Make sure you discuss any questions you have with your health care provider. Document Revised: 10/10/2019 Document Reviewed: 10/10/2019 Elsevier Patient Education  2021 [employer].

## 2020-05-17 NOTE — Patient Instructions (Addendum)
DUE TO COVID-19 ONLY ONE VISITOR IS ALLOWED TO COME WITH YOU AND STAY IN THE WAITING ROOM ONLY DURING PRE OP AND PROCEDURE DAY OF SURGERY. THE 1 VISITOR  MAY VISIT WITH YOU AFTER SURGERY IN YOUR PRIVATE ROOM DURING VISITING HOURS ONLY!  YOU NEED TO HAVE A COVID 19 TEST ON_3/28______ @1 :45_______, THIS TEST MUST BE DONE BEFORE SURGERY,  COVID TESTING SITE [place] [address], IT IS ON THE RIGHT GOING OUT [address] APPROXIMATELY  2 MINUTES PAST [employer] ON THE RIGHT. ONCE YOUR COVID TEST IS COMPLETED,  PLEASE BEGIN THE QUARANTINE INSTRUCTIONS AS OUTLINED IN YOUR HANDOUT.                [place]   Your procedure is scheduled on: 05/30/20   Report to [facility] Main  Entrance   Report to admitting at  10:00 AM     Call this number if you have problems the morning of surgery [phone]   . BRUSH YOUR TEETH MORNING OF SURGERY AND RINSE YOUR MOUTH OUT, NO CHEWING GUM CANDY OR MINTS.  No food after midnight.    You may have clear liquid until 9:30 AM.   . Nothing by mouth after 9:30 AM.    Take these medicines the morning of surgery with A SIP OF WATER: none  Bring your mask and tubing to the hospital   How to Manage Your Diabetes Before and After Surgery  Why is it important to control my blood sugar before and after surgery? . Improving blood sugar levels before and after surgery helps healing and can limit problems. . A way of improving blood sugar control is eating a healthy diet by: o  Eating less sugar and carbohydrates o  Increasing activity/exercise o  Talking with your doctor about reaching your blood sugar goals . High blood sugars (greater than 180 mg/dL) can raise your risk of infections and slow your recovery, so you will need to focus on controlling your diabetes during the weeks before surgery. . Make sure that the doctor who takes care of your diabetes knows about your planned surgery including the date and  location.  How do I manage my blood sugar before surgery? . Check your blood sugar at least 4 times a day, starting 2 days before surgery, to make sure that the level is not too high or low. o Check your blood sugar the morning of your surgery when you wake up and every 2 hours until you get to the Short Stay unit. . If your blood sugar is less than 70 mg/dL, you will need to treat for low blood sugar: o Do not take insulin. o Treat a low blood sugar (less than 70 mg/dL) with  cup of clear juice (cranberry or apple), 4 glucose tablets, OR glucose gel. o Recheck blood sugar in 15 minutes after treatment (to make sure it is greater than 70 mg/dL). If your blood sugar is not greater than 70 mg/dL on recheck, call [phone] for further instructions. . Report your blood sugar to the short stay nurse when you get to Short Stay.  . If you are admitted to the hospital after surgery: o Your blood sugar will be checked by the staff and you will probably be given insulin after surgery (instead of oral diabetes medicines) to make sure you have good blood sugar levels. o The goal for blood sugar control after surgery is 80-180 mg/dL.   WHAT DO I  DO ABOUT MY DIABETES MEDICATION?  Marland Kitchen Do not take oral diabetes medicines (pills) the morning of surgery.               You may not have any metal on your body including hair pins and              piercings  Do not wear jewelry, make-up, lotions, powders or perfumes, deodorant             Do not wear nail polish on your fingernails.  Do not shave  48 hours prior to surgery.               Do not bring valuables to the hospital. [place].  Contacts, dentures or bridgework may not be worn into surgery.      Patients discharged the day of surgery will not be allowed to drive home.   IF YOU ARE HAVING SURGERY AND GOING HOME THE SAME DAY, YOU MUST HAVE AN ADULT TO DRIVE YOU HOME AND BE WITH YOU FOR 24  HOURS.   YOU MAY GO HOME BY TAXI OR UBER OR ORTHERWISE, BUT AN ADULT MUST ACCOMPANY YOU HOME AND STAY WITH YOU FOR 24 HOURS.  Name and phone number of your driver:  Special Instructions: N/A              Please read over the following fact sheets you were given: _____________________________________________________________________             [facility]- Preparing for Total Shoulder Arthroplasty    Before surgery, you can play an important role. Because skin is not sterile, your skin needs to be as free of germs as possible. You can reduce the number of germs on your skin by using the following products. . Benzoyl Peroxide Gel o Reduces the number of germs present on the skin o Applied twice a day to shoulder area starting two days before surgery    ==================================================================  Please follow these instructions carefully:  BENZOYL PEROXIDE 5% GEL  Please do not use if you have an allergy to benzoyl peroxide.   If your skin becomes reddened/irritated stop using the benzoyl peroxide.  Starting two days before surgery, apply as follows: 1. Apply benzoyl peroxide in the morning and at night. Apply after taking a shower. If you are not taking a shower clean entire shoulder front, back, and side along with the armpit with a clean wet washcloth.  2. Place a quarter-sized dollop on your shoulder and rub in thoroughly, making sure to cover the front, back, and side of your shoulder, along with the armpit.   2 days before ____ AM   ____ PM   (3/29)           1 day before ____ AM   ____ PM (3/30)                         3. Do this twice a day for two days.  (Last application is the night before surgery, AFTER using the CHG soap as described below).  4. Do NOT apply benzoyl peroxide gel on the day of surgery.   [place] - Preparing for Surgery Before surgery, you can play an important role.  Because skin is not sterile, your skin needs to be as  free of germs as possible.  You can  reduce the number of germs on your skin by washing with CHG (chlorahexidine gluconate) soap before surgery.  CHG is an antiseptic cleaner which kills germs and bonds with the skin to continue killing germs even after washing. Please DO NOT use if you have an allergy to CHG or antibacterial soaps.  If your skin becomes reddened/irritated stop using the CHG and inform your nurse when you arrive at Short Stay. Do not shave (including legs and underarms) for at least 48 hours prior to the first CHG shower.   Please follow these instructions carefully:  1.  Shower with CHG Soap the night before surgery and the  morning of Surgery.  2.  If you choose to wash your hair, wash your hair first as usual with your  normal  shampoo.  3.  After you shampoo, rinse your hair and body thoroughly to remove the  shampoo.                                        4.  Use CHG as you would any other liquid soap.  You can apply chg directly  to the skin and wash                       Gently with a scrungie or clean washcloth.  5.  Apply the CHG Soap to your body ONLY FROM THE NECK DOWN.   Do not use on face/ open                           Wound or open sores. Avoid contact with eyes, ears mouth and genitals (private parts).                       Wash face,  Genitals (private parts) with your normal soap.             6.  Wash thoroughly, paying special attention to the area where your surgery  will be performed.  7.  Thoroughly rinse your body with warm water from the neck down.  8.  DO NOT shower/wash with your normal soap after using and rinsing off  the CHG Soap.             9.  Pat yourself dry with a clean towel.            10.  Wear clean pajamas.            11.  Place clean sheets on your bed the night of your first shower and do not  sleep with pets. Day of Surgery : Do not apply any lotions/deodorants the morning of surgery.  Please wear clean clothes to the hospital/surgery  center.  FAILURE TO FOLLOW THESE INSTRUCTIONS MAY RESULT IN THE CANCELLATION OF YOUR SURGERY PATIENT SIGNATURE_________________________________  NURSE SIGNATURE__________________________________  ________________________________________________________________________   Adam Phenix  An incentive spirometer is a tool that can help keep your lungs clear and active. This tool measures how well you are filling your lungs with each breath. Taking long deep breaths may help reverse or decrease the chance of developing breathing (pulmonary) problems (especially infection) following:  A long period of time when you are unable to move or be active. BEFORE THE PROCEDURE   If the spirometer includes an indicator to show your best effort, your nurse or  respiratory therapist will set it to a desired goal.  If possible, sit up straight or lean slightly forward. Try not to slouch.  Hold the incentive spirometer in an upright position. INSTRUCTIONS FOR USE  1. Sit on the edge of your bed if possible, or sit up as far as you can in bed or on a chair. 2. Hold the incentive spirometer in an upright position. 3. Breathe out normally. 4. Place the mouthpiece in your mouth and seal your lips tightly around it. 5. Breathe in slowly and as deeply as possible, raising the piston or the ball toward the top of the column. 6. Hold your breath for 3-5 seconds or for as long as possible. Allow the piston or ball to fall to the bottom of the column. 7. Remove the mouthpiece from your mouth and breathe out normally. 8. Rest for a few seconds and repeat Steps 1 through 7 at least 10 times every 1-2 hours when you are awake. Take your time and take a few normal breaths between deep breaths. 9. The spirometer may include an indicator to show your best effort. Use the indicator as a goal to work toward during each repetition. 10. After each set of 10 deep breaths, practice coughing to be sure your lungs are  clear. If you have an incision (the cut made at the time of surgery), support your incision when coughing by placing a pillow or rolled up towels firmly against it. Once you are able to get out of bed, walk around indoors and cough well. You may stop using the incentive spirometer when instructed by your caregiver.  RISKS AND COMPLICATIONS  Take your time so you do not get dizzy or light-headed.  If you are in pain, you may need to take or ask for pain medication before doing incentive spirometry. It is harder to take a deep breath if you are having pain. AFTER USE  Rest and breathe slowly and easily.  It can be helpful to keep track of a log of your progress. Your caregiver can provide you with a simple table to help with this. If you are using the spirometer at home, follow these instructions: [place] IF:   You are having difficultly using the spirometer.  You have trouble using the spirometer as often as instructed.  Your pain medication is not giving enough relief while using the spirometer.  You develop fever of 100.5 F (38.1 C) or higher. SEEK IMMEDIATE MEDICAL CARE IF:   You cough up bloody sputum that had not been present before.  You develop fever of 102 F (38.9 C) or greater.  You develop worsening pain at or near the incision site. MAKE SURE YOU:   Understand these instructions.  Will watch your condition.  Will get help right away if you are not doing well or get worse. Document Released: 06/29/2006 Document Revised: 05/11/2011 Document Reviewed: 08/30/2006 [facility] Patient Information 2014 [place], [place].   ________________________________________________________________________

## 2020-05-20 ENCOUNTER — Encounter (HOSPITAL_COMMUNITY): Payer: Self-pay

## 2020-05-20 ENCOUNTER — Encounter (HOSPITAL_COMMUNITY)
Admission: RE | Admit: 2020-05-20 | Discharge: 2020-05-20 | Disposition: A | Discharge status: Home / Self Care | Payer: Medicare PPO | Source: Ambulatory Visit | Attending: Orthopedic Surgery | Admitting: Orthopedic Surgery

## 2020-05-20 ENCOUNTER — Other Ambulatory Visit: Payer: Self-pay

## 2020-05-20 DIAGNOSIS — Z01812 Encounter for preprocedural laboratory examination: Secondary | ICD-10-CM | POA: Diagnosis present

## 2020-05-20 HISTORY — DX: Malignant (primary) neoplasm, unspecified: C80.1

## 2020-05-20 LAB — BASIC METABOLIC PANEL
Anion gap: 10 (ref 5–15)
BUN: 15 mg/dL (ref 8–23)
CO2: 26 mmol/L (ref 22–32)
Calcium: 9.5 mg/dL (ref 8.9–10.3)
Chloride: 107 mmol/L (ref 98–111)
Creatinine, Ser: 0.74 mg/dL (ref 0.44–1.00)
GFR, Estimated: >60 mL/min (ref >60)
Glucose, Bld: 147 mg/dL — ABNORMAL HIGH (ref 70–99)
Potassium: 3.9 mmol/L (ref 3.5–5.1)
Sodium: 143 mmol/L (ref 135–145)

## 2020-05-20 LAB — CBC
HCT: 41 % (ref 36.0–46.0)
Hemoglobin: 13.4 g/dL (ref 12.0–15.0)
MCH: 30.9 pg (ref 26.0–34.0)
MCHC: 32.7 g/dL (ref 30.0–36.0)
MCV: 94.7 fL (ref 80.0–100.0)
Platelets: 220 10*3/uL (ref 150–400)
RBC: 4.33 MIL/uL (ref 3.87–5.11)
RDW: 13.1 % (ref 11.5–15.5)
WBC: 4.8 10*3/uL (ref 4.0–10.5)
nRBC: 0 % (ref 0.0–0.2)

## 2020-05-20 LAB — GLUCOSE, CAPILLARY: Glucose-Capillary: 200 mg/dL — ABNORMAL HIGH (ref 70–99)

## 2020-05-20 LAB — SURGICAL PCR SCREEN
MRSA, PCR: NEGATIVE
Staphylococcus aureus: NEGATIVE

## 2020-05-20 NOTE — Progress Notes (Signed)
COVID Vaccine Completed:Yes Date COVID Vaccine completed:04/06/19-booster 11/28/19 COVID vaccine manufacturer: Pfizer     PCP - Vicenta Aly FNP Cardiologist - Dr. Johney Frame  Chest x-ray - no EKG - 05/17/20-epic Stress Test - no ECHO - 08/04/16-epic Cardiac Cath - no Pacemaker/ICD device last checked:NA  Sleep Study - yes CPAP -yes   Fasting Blood Sugar - 120-140 Checks Blood Sugar _____ times a day pt has not been testing  Blood Thinner Instructions:NA Aspirin Instructions: Last Dose:  Anesthesia review:   Patient denies shortness of breath, fever, cough and chest pain at PAT appointment  yes Patient verbalized understanding of instructions that were given to them at the PAT appointment. Patient was also instructed that they will need to review over the PAT instructions again at home before surgery.yes Pt has a heart murmur and her next ECHO will be 06/13/20 She has no SOB climbing stairs, doing house work or with ADLs

## 2020-05-21 ENCOUNTER — Ambulatory Visit (HOSPITAL_COMMUNITY): Payer: Medicare PPO | Admitting: Certified Registered Nurse Anesthetist

## 2020-05-21 ENCOUNTER — Ambulatory Visit (HOSPITAL_COMMUNITY): Payer: Medicare PPO | Admitting: Physician Assistant

## 2020-05-21 NOTE — Progress Notes (Signed)
Anesthesia Chart Review   Case: [id number] Date/Time: 05/30/20 1215   Procedure: REVERSE SHOULDER ARTHROPLASTY (Right Shoulder) - 169min   Anesthesia type: General   Pre-op diagnosis: Right shoulder osteoarthritis, rotator cuff dysfunction   Location: [facility] ROOM 06 / [facility]   Surgeons: Justice Britain, MD      DISCUSSION:75 y.o. never smoker with h/o DM II, OSA w/CPAP, HTN, SVT s/p AVRT/AVR NT ablation, chronic LBBB, right shoulder OA, rotator cuff dysfunction scheduled for above procedure 05/30/2020 with Dr. Justice Britain.   Pt last seen by cardiology 05/17/2020. Per OV note, "Preop cardiac evaluation: She is doing well.  Chart reviewed as part of pre-operative protocol coverage. Given past medical history and time since last visit, based on ACC/AHA guidelines, Marcia Norris would be at acceptable risk for the planned procedure without further cardiovascular testing. This  will be route this recommendation to the requesting party via Epic fax function and remove from pre-op pool.Please call with questions."  Anticipate pt can proceed with planned procedure barring acute status change.   VS: BP (!) 143/62   Pulse 73   Temp 36.7 C (Oral)   Resp 20   Ht 5\' 6"  (1.676 m)   Wt 77.6 kg   SpO2 97%   BMI 27.60 kg/m   PROVIDERS: Vicenta Aly, FNP is PCP   Ena Dawley, MD is Cardiologist  LABS: Labs reviewed: Acceptable for surgery. (all labs ordered are listed, but only abnormal results are displayed)  Labs Reviewed  BASIC METABOLIC PANEL - Abnormal; Notable for the following components:      Result Value   Glucose, Bld 147 (*)    All other components within normal limits  GLUCOSE, CAPILLARY - Abnormal; Notable for the following components:   Glucose-Capillary 200 (*)    All other components within normal limits  SURGICAL PCR SCREEN  CBC     IMAGES:   EKG: 05/17/2020 Rate 84 bpm  NSR LBBB  CV: Echo 08/04/2016 Study Conclusions   - Left ventricle: The cavity size was  normal. There was mild focal  basal hypertrophy of the septum. Systolic function was normal.  The estimated ejection fraction was in the range of 55% to 60%.  Wall motion was normal; there were no regional wall motion  abnormalities. Doppler parameters are consistent with abnormal  left ventricular relaxation (grade 1 diastolic dysfunction).  - Ventricular septum: Septal motion showed paradox. These changes  are consistent with a left bundle branch block.  - Mitral valve: There was mild regurgitation directed centrally. Past Medical History:  Diagnosis Date  . Arthritis   . Asthma   . Atrial flutter ([place])   . 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 Diabetes mellitus without complication (HCC)    borderline, diet controlled, no meds  . Diverticulosis   . Exertional shortness of breath   . Heart murmur   . Hyperglycemia   . Hyperlipidemia   . Hypertension   . OSA on CPAP 02/16/2012   wears CPAP machine at night, but takes it off during the night; pt stated "I was told I have mild sleep apnea"  . Seasonal allergies   . Varicose veins   . Vitamin D deficiency     Past Surgical History:  Procedure Laterality Date  . ABDOMINAL HYSTERECTOMY  1990's   partial  . ATRIAL FLUTTER ABLATION  01/27/2013  . ATRIAL FLUTTER ABLATION N/A 01/27/2013   Procedure: ATRIAL FLUTTER ABLATION;  Surgeon: Evans Lance, MD;  Location:  [facility] CATH LAB;  Service: Cardiovascular;  Laterality: N/A;  . BACK SURGERY  04/2013  . CATARACT EXTRACTION W/ INTRAOCULAR LENS  IMPLANT, BILATERAL Bilateral 10-12/2012  . COLONOSCOPY    . CYSTOSCOPY     "multiple after bladder tumor removed; released in 2013" (01/27/2013)  . TRANSURETHRAL RESECTION OF BLADDER TUMOR  ~ 2004    MEDICATIONS: . ALPRAZolam (XANAX) 0.5 MG tablet  . aspirin 81 MG tablet  . atorvastatin (LIPITOR) 40 MG tablet  . Cholecalciferol (VITAMIN D3 MAXIMUM STRENGTH) 125 MCG (5000 UT) capsule  . metFORMIN (GLUCOPHAGE) 500 MG tablet  .  Multiple Vitamins-Minerals (ONE-A-DAY 50 PLUS PO)  . valsartan (DIOVAN) 80 MG tablet   No current facility-administered medications for this encounter.    Konrad Felix, PA-C [facility] Pre-Surgical Testing [phone]

## 2020-05-23 ENCOUNTER — Ambulatory Visit (HOSPITAL_COMMUNITY)
Admission: RE | Admit: 2020-05-23 | Discharge: 2020-05-23 | Disposition: A | Discharge status: Home / Self Care | Payer: Medicare PPO | Source: Ambulatory Visit | Attending: Cardiovascular Disease | Admitting: Cardiovascular Disease

## 2020-05-23 ENCOUNTER — Other Ambulatory Visit: Payer: Self-pay

## 2020-05-23 DIAGNOSIS — I6522 Occlusion and stenosis of left carotid artery: Secondary | ICD-10-CM | POA: Diagnosis not present

## 2020-05-27 ENCOUNTER — Other Ambulatory Visit (HOSPITAL_COMMUNITY)
Admission: RE | Admit: 2020-05-27 | Discharge: 2020-05-27 | Disposition: A | Discharge status: Home / Self Care | Payer: Medicare PPO | Source: Ambulatory Visit | Attending: Orthopedic Surgery | Admitting: Orthopedic Surgery

## 2020-05-27 DIAGNOSIS — Z01812 Encounter for preprocedural laboratory examination: Secondary | ICD-10-CM | POA: Insufficient documentation

## 2020-05-27 DIAGNOSIS — Z20822 Contact with and (suspected) exposure to covid-19: Secondary | ICD-10-CM | POA: Insufficient documentation

## 2020-05-28 LAB — SARS CORONAVIRUS 2 (TAT 6-24 HRS): SARS Coronavirus 2: NEGATIVE

## 2020-05-30 ENCOUNTER — Encounter (HOSPITAL_COMMUNITY)
Admission: RE | Disposition: A | Discharge status: Home / Self Care | Payer: Self-pay | Source: Other Acute Inpatient Hospital | Attending: Orthopedic Surgery

## 2020-05-30 ENCOUNTER — Encounter (HOSPITAL_COMMUNITY): Payer: Self-pay | Admitting: Orthopedic Surgery

## 2020-05-30 ENCOUNTER — Ambulatory Visit (HOSPITAL_COMMUNITY)
Admission: RE | Admit: 2020-05-30 | Discharge: 2020-05-30 | Disposition: A | Discharge status: Home / Self Care | Payer: Medicare PPO | Source: Other Acute Inpatient Hospital | Attending: Orthopedic Surgery | Admitting: Orthopedic Surgery

## 2020-05-30 DIAGNOSIS — Z8551 Personal history of malignant neoplasm of bladder: Secondary | ICD-10-CM | POA: Insufficient documentation

## 2020-05-30 DIAGNOSIS — R319 Hematuria, unspecified: Secondary | ICD-10-CM | POA: Diagnosis not present

## 2020-05-30 DIAGNOSIS — Z538 Procedure and treatment not carried out for other reasons: Secondary | ICD-10-CM | POA: Diagnosis not present

## 2020-05-30 DIAGNOSIS — M19011 Primary osteoarthritis, right shoulder: Secondary | ICD-10-CM | POA: Insufficient documentation

## 2020-05-30 LAB — URINALYSIS, ROUTINE W REFLEX MICROSCOPIC
Bacteria, UA: NONE SEEN
Bilirubin Urine: NEGATIVE
Glucose, UA: NEGATIVE mg/dL
Ketones, ur: NEGATIVE mg/dL
Leukocytes,Ua: NEGATIVE
Nitrite: NEGATIVE
Protein, ur: NEGATIVE mg/dL
RBC / HPF: >50 RBC/hpf — ABNORMAL HIGH (ref 0–5)
Specific Gravity, Urine: 1.006 (ref 1.005–1.030)
pH: 7 (ref 5.0–8.0)

## 2020-05-30 LAB — GLUCOSE, CAPILLARY: Glucose-Capillary: 124 mg/dL — ABNORMAL HIGH (ref 70–99)

## 2020-05-30 SURGERY — ARTHROPLASTY, SHOULDER, TOTAL, REVERSE
Anesthesia: General | Site: Shoulder | Laterality: Right

## 2020-05-30 MED ORDER — LIDOCAINE 2% (20 MG/ML) 5 ML SYRINGE
INTRAMUSCULAR | Status: AC
  Filled 2020-05-30: qty 5

## 2020-05-30 MED ORDER — DEXAMETHASONE SODIUM PHOSPHATE 10 MG/ML IJ SOLN
INTRAMUSCULAR | Status: AC
  Filled 2020-05-30: qty 1

## 2020-05-30 MED ORDER — MIDAZOLAM HCL 2 MG/2ML IJ SOLN
1.0000 mg | Freq: Once | INTRAMUSCULAR | Status: DC
  Filled 2020-05-30: qty 2

## 2020-05-30 MED ORDER — ONDANSETRON HCL 4 MG/2ML IJ SOLN
INTRAMUSCULAR | Status: AC
  Filled 2020-05-30: qty 2

## 2020-05-30 MED ORDER — CHLORHEXIDINE GLUCONATE 0.12 % MT SOLN
15.0000 mL | Freq: Once | OROMUCOSAL | Status: AC
  Administered 2020-05-30: 15 mL via OROMUCOSAL

## 2020-05-30 MED ORDER — TRANEXAMIC ACID-NACL 1000-0.7 MG/100ML-% IV SOLN
1000.0000 mg | INTRAVENOUS | Status: DC
  Filled 2020-05-30: qty 100

## 2020-05-30 MED ORDER — FENTANYL CITRATE (PF) 100 MCG/2ML IJ SOLN
INTRAMUSCULAR | Status: AC
  Filled 2020-05-30: qty 2

## 2020-05-30 MED ORDER — CEFAZOLIN SODIUM-DEXTROSE 2-4 GM/100ML-% IV SOLN
2.0000 g | INTRAVENOUS | Status: DC
  Filled 2020-05-30: qty 100

## 2020-05-30 MED ORDER — ACETAMINOPHEN 500 MG PO TABS
1000.0000 mg | ORAL_TABLET | Freq: Once | ORAL | Status: AC
  Administered 2020-05-30: 1000 mg via ORAL
  Filled 2020-05-30: qty 2

## 2020-05-30 MED ORDER — ORAL CARE MOUTH RINSE: 15.0000 mL | Freq: Once | OROMUCOSAL | Status: AC

## 2020-05-30 MED ORDER — PROPOFOL 10 MG/ML IV BOLUS
INTRAVENOUS | Status: AC
  Filled 2020-05-30: qty 20

## 2020-05-30 MED ORDER — FENTANYL CITRATE (PF) 100 MCG/2ML IJ SOLN
50.0000 ug | Freq: Once | INTRAMUSCULAR | Status: DC
  Filled 2020-05-30: qty 2

## 2020-05-30 MED ORDER — PHENYLEPHRINE HCL (PRESSORS) 10 MG/ML IV SOLN
INTRAVENOUS | Status: AC
  Filled 2020-05-30: qty 1

## 2020-05-30 MED ORDER — ROCURONIUM BROMIDE 10 MG/ML (PF) SYRINGE
PREFILLED_SYRINGE | INTRAVENOUS | Status: AC
  Filled 2020-05-30: qty 10

## 2020-05-30 MED ORDER — LACTATED RINGERS IV SOLN: INTRAVENOUS | Status: DC

## 2020-05-30 NOTE — Anesthesia Preprocedure Evaluation (Addendum)
Anesthesia Evaluation  Patient identified by MRN, date of birth, ID band Patient awake    Reviewed: Allergy & Precautions, NPO status , Patient's Chart, lab work & pertinent test results  Airway Mallampati: II  TM Distance: >3 FB Neck ROM: Full    Dental  (+) Dental Advisory Given   Pulmonary asthma , sleep apnea and Continuous Positive Airway Pressure Ventilation ,    breath sounds clear to auscultation       Cardiovascular hypertension, Pt. on medications + dysrhythmias Atrial Fibrillation  Rhythm:Regular Rate:Normal     Neuro/Psych negative neurological ROS     GI/Hepatic negative GI ROS, Neg liver ROS,   Endo/Other  diabetes, Type 2, Oral Hypoglycemic Agents  Renal/GU negative Renal ROS     Musculoskeletal  (+) Arthritis ,   Abdominal   Peds  Hematology negative hematology ROS (+)   Anesthesia Other Findings   Reproductive/Obstetrics                             Anesthesia Physical Anesthesia Plan  ASA: II  Anesthesia Plan: General   Post-op Pain Management:  Regional for Post-op pain   Induction: Intravenous  PONV Risk Score and Plan: 3 and Ondansetron, Dexamethasone and Treatment may vary due to age or medical condition  Airway Management Planned: Oral ETT  Additional Equipment:   Intra-op Plan:   Post-operative Plan: Extubation in OR  Informed Consent: I have reviewed the patients History and Physical, chart, labs and discussed the procedure including the risks, benefits and alternatives for the proposed anesthesia with the patient or authorized representative who has indicated his/her understanding and acceptance.     Dental advisory given  Plan Discussed with: CRNA  Anesthesia Plan Comments: (Pt expressed new onset hematuria yesterday and today. No other symptoms of UTI or cystitis and hx of bladder CA. UA ordered and discussed with Dr. Onnie Graham. Plan for case to be  cancelled pending additional work up of new onset hematuria.)       Anesthesia Quick Evaluation

## 2020-05-30 NOTE — Progress Notes (Signed)
[name]  MRN: [id number] DOB/Age: 1945/07/31 75 y.o. Physician: Ander Slade, M.D. Day of Surgery Procedure(s) (LRB): REVERSE SHOULDER ARTHROPLASTY (Right)   Updated history:  [name] had been scheduled for right shoulder reverse arthroplasty today.  On presentation this morning to the short stay facility she reported that she had developed within the last 2 days hematuria.  She reports that this has been painless.  She had no fevers or chills.  No abdominal discomfort.  No dysuria.  She does share with [name] that greater than 10 years ago she had been treated for some type of "bladder cancer".  She states however that she has been asymptomatic for many years and that her urologist had "discharged her".  I have consulted briefly with one of our local urologist and discussed the various treatment options.  At this time I am concerned with the prospect of a complex elective shoulder procedure not knowing the exact status that has created this hematuria.  I discussed these issues at length with [name].  She is in agreement with our plan to cancel surgery today and make arrangements for her to be evaluated by the local urology group.  Once she has gained appropriate clearance then we will go ahead and reschedule her right shoulder surgery.  Masiyah Jorstad M Renald Haithcock 05/30/2020, 12:01 PM   Contact # [phone]

## 2020-05-30 NOTE — Progress Notes (Addendum)
Patient had discolored urine this morning so Dr. Onnie Graham is canceling her shoulder surgery today and having patient follow up with her urologist. Patient informed and understands.  U/A and urine culture collected & sent to lab before patient was d/c home.

## 2020-05-31 DIAGNOSIS — Z8551 Personal history of malignant neoplasm of bladder: Secondary | ICD-10-CM | POA: Insufficient documentation

## 2020-05-31 LAB — URINE CULTURE: Culture: <10000 — AB

## 2020-06-13 ENCOUNTER — Ambulatory Visit (HOSPITAL_COMMUNITY): Payer: Medicare PPO | Attending: Cardiology

## 2020-06-13 ENCOUNTER — Other Ambulatory Visit: Payer: Self-pay

## 2020-06-13 DIAGNOSIS — Z8249 Family history of ischemic heart disease and other diseases of the circulatory system: Secondary | ICD-10-CM | POA: Insufficient documentation

## 2020-06-13 DIAGNOSIS — R011 Cardiac murmur, unspecified: Secondary | ICD-10-CM | POA: Diagnosis not present

## 2020-06-13 DIAGNOSIS — R0602 Shortness of breath: Secondary | ICD-10-CM | POA: Diagnosis not present

## 2020-06-13 DIAGNOSIS — G4730 Sleep apnea, unspecified: Secondary | ICD-10-CM | POA: Insufficient documentation

## 2020-06-13 DIAGNOSIS — I447 Left bundle-branch block, unspecified: Secondary | ICD-10-CM | POA: Insufficient documentation

## 2020-06-13 DIAGNOSIS — I6522 Occlusion and stenosis of left carotid artery: Secondary | ICD-10-CM | POA: Diagnosis present

## 2020-06-13 DIAGNOSIS — E785 Hyperlipidemia, unspecified: Secondary | ICD-10-CM | POA: Insufficient documentation

## 2020-06-13 DIAGNOSIS — I517 Cardiomegaly: Secondary | ICD-10-CM | POA: Insufficient documentation

## 2020-06-13 DIAGNOSIS — I4892 Unspecified atrial flutter: Secondary | ICD-10-CM | POA: Diagnosis not present

## 2020-06-13 DIAGNOSIS — E119 Type 2 diabetes mellitus without complications: Secondary | ICD-10-CM | POA: Diagnosis not present

## 2020-06-13 LAB — ECHOCARDIOGRAM COMPLETE
Area-P 1/2: 4.06 cm2
S' Lateral: 3 cm

## 2020-07-11 DIAGNOSIS — M19011 Primary osteoarthritis, right shoulder: Secondary | ICD-10-CM | POA: Insufficient documentation

## 2020-07-14 NOTE — Progress Notes (Deleted)
Cardiology Office Note:    Date:  07/14/2020   ID:  Marcia Norris, DOB 1945-03-30, MRN [id number]  PCP:  Vicenta Aly, [place] Prov[facility] Cardiologist:  Ena Dawley, MD (Inactive) {  Referring MD: Vicenta Aly, FNP    History of Present Illness:    Marcia Norris is a 75 y.o. female with a hx of SVT status post AVRT/AVNRT ablation, chronic left bundle branch block, hypertension, hyperlipidemia, mild carotid artery stenosis, with history of OSA who was previously followed by Dr. [name] who now presents to clinic for follow-up.  Last saw Marcia Hines, NP on 05/17/20 for preoperative clearance prior to shoulder arthroplasty on 05/30/20. She was doing well from CV standpoint. TTE obtained 06/13/20 showed LVEF 60-65%, mild aortic sclerosis, no significant valve disease  Today,  Past Medical History:  Diagnosis Date  . Arthritis   . Asthma   . Atrial flutter ([place])   . Cancer ([place])    skin. lt shoulder  . Diabetes mellitus without complication (HCC)    borderline, diet controlled, no meds  . Diverticulosis   . Exertional shortness of breath   . Heart murmur   . Hyperglycemia   . Hyperlipidemia   . Hypertension   . OSA on CPAP 02/16/2012   wears CPAP machine at night, but takes it off during the night; pt stated "I was told I have mild sleep apnea"  . Seasonal allergies   . Varicose veins   . Vitamin D deficiency     Past Surgical History:  Procedure Laterality Date  . ABDOMINAL HYSTERECTOMY  1990's   partial  . ATRIAL FLUTTER ABLATION  01/27/2013  . ATRIAL FLUTTER ABLATION N/A 01/27/2013   Procedure: ATRIAL FLUTTER ABLATION;  Surgeon: Evans Lance, MD;  Location: [facility] CATH LAB;  Service: Cardiovascular;  Laterality: N/A;  . BACK SURGERY  04/2013  . CATARACT EXTRACTION W/ INTRAOCULAR LENS  IMPLANT, BILATERAL Bilateral 10-12/2012  . COLONOSCOPY    . CYSTOSCOPY     "multiple after bladder tumor removed; released in 2013" (01/27/2013)  .  TRANSURETHRAL RESECTION OF BLADDER TUMOR  ~ 2004    Current Medications: No outpatient medications have been marked as taking for the 07/17/20 encounter (Appointment) with Freada Bergeron, MD.     Allergies:   Latex and Codeine   Social History   Socioeconomic History  . Marital status: Married    Spouse name: Not on file  . Number of children: 2  . Years of education: 24  . Highest education level: Not on file  Occupational History  . Occupation: retired  Tobacco Use  . Smoking status: Never Smoker  . Smokeless tobacco: Never Used  Vaping Use  . Vaping Use: Never used  Substance and Sexual Activity  . Alcohol use: Yes    Alcohol/week: 0.0 standard drinks    Comment: Occasional glass of wine  . Drug use: No  . Sexual activity: Yes  Other Topics Concern  . Not on file  Social History Narrative   Lives at home with her husband.   Right-handed.   1 cups caffeine/day.      Social Determinants of Health   Financial Resource Strain: Not on file  Food Insecurity: Not on file  Transportation Needs: Not on file  Physical Activity: Not on file  Stress: Not on file  Social Connections: Not on file     Family History: The patient's ***family history includes Allergies in her mother; Bladder Cancer in her father;  Cancer in her mother; Diabetes in her father and sister; Heart disease in her father; Hyperlipidemia in her father; Hypertension in her father; Kidney disease in her father; Varicose Veins in her mother. There is no history of Colon cancer.  ROS:   Please see the history of present illness.    *** All other systems reviewed and are negative.  EKGs/Labs/Other Studies Reviewed:    The following studies were reviewed today: TTE June 15, 2020: IMPRESSIONS    1. Left ventricular ejection fraction, by estimation, is 60 to 65%. The  left ventricle has normal function. The left ventricle has no regional  wall motion abnormalities. There is mild concentric left  ventricular  hypertrophy most notably in the  basal-septal segment. Left ventricular diastolic parameters are consistent  with Grade I diastolic dysfunction (impaired relaxation).  2. Right ventricular systolic function is normal. The right ventricular  size is normal. There is normal pulmonary artery systolic pressure.  3. Left atrial size was mildly dilated.  4. The mitral valve is normal in structure. Trivial mitral valve  regurgitation.  5. The aortic valve is tricuspid. There is mild thickening of the aortic  valve. Aortic valve regurgitation is not visualized. Mild aortic valve  sclerosis is present, with no evidence of aortic valve stenosis.  6. The inferior vena cava is normal in size with greater than 50%  respiratory variability, suggesting right atrial pressure of 3 mmHg.   Comparison(s): Compared to prior echo in Jun 15, 2016, there is no significant  change.   Echocardiogram 08/04/2016  Left ventricle: The cavity size was normal. There was mild focal  basal hypertrophy of the septum. Systolic function was normal.  The estimated ejection fraction was in the range of 55% to 60%.  Wall motion was normal; there were no regional wall motion  abnormalities. Doppler parameters are consistent with abnormal  left ventricular relaxation (grade 1 diastolic dysfunction).  - Ventricular septum: Septal motion showed paradox. These changes  are consistent with a left bundle branch block.  - Mitral valve: There was mild regurgitation directed centrally.     EKG:  EKG is *** ordered today.  The ekg ordered today demonstrates ***  Recent Labs: 05/20/2020: BUN 15; Creatinine, Ser 0.74; Hemoglobin 13.4; Platelets 220; Potassium 3.9; Sodium 143  Recent Lipid Panel No results found for: CHOL, TRIG, HDL, CHOLHDL, VLDL, LDLCALC, LDLDIRECT   Risk Assessment/Calculations:   {Does this patient have ATRIAL FIBRILLATION?[phone]}   Physical Exam:    VS:  There were no vitals  taken for this visit.    Wt Readings from Last 3 Encounters:  05/30/20 171 lb 1.2 oz (77.6 kg)  05/20/20 171 lb (77.6 kg)  05/17/20 170 lb 3.2 oz (77.2 kg)     GEN: *** Well nourished, well developed in no acute distress HEENT: Normal NECK: No JVD; No carotid bruits LYMPHATICS: No lymphadenopathy CARDIAC: ***RRR, no murmurs, rubs, gallops RESPIRATORY:  Clear to auscultation without rales, wheezing or rhonchi  ABDOMEN: Soft, non-tender, non-distended MUSCULOSKELETAL:  No edema; No deformity  SKIN: Warm and dry NEUROLOGIC:  Alert and oriented x 3 PSYCHIATRIC:  Normal affect   ASSESSMENT:    No diagnosis found. PLAN:    In order of problems listed above:  #SVT s/p ablation: Doing well with no recurrence of palpitations. -Continue to monitor  #HTN: Well controlled. -Continue valsartan 80mg  daily  #HLD: LDL 89 in 12/2019 -Continue lipitor 40mg  daily  #DMII: Managed by PCP. -Continue metformin 500/1000mg     {Are you ordering a CV Procedure (  e.g. stress test, cath, DCCV, TEE, etc)?   Press F2        :[id number]}    Medication Adjustments/Labs and Tests Ordered: Current medicines are reviewed at length with the patient today.  Concerns regarding medicines are outlined above.  No orders of the defined types were placed in this encounter.  No orders of the defined types were placed in this encounter.   There are no Patient Instructions on file for this visit.   Signed, Freada Bergeron, MD  07/14/2020 4:19 PM    [facility]

## 2020-07-17 ENCOUNTER — Ambulatory Visit: Payer: Medicare PPO | Admitting: Cardiology

## 2020-07-17 ENCOUNTER — Encounter: Payer: Self-pay | Admitting: Cardiology

## 2020-07-17 ENCOUNTER — Other Ambulatory Visit: Payer: Self-pay

## 2020-07-17 VITALS — BP 156/80 | HR 85 | Ht 66.0 in | Wt 169.8 lb

## 2020-07-17 DIAGNOSIS — E782 Mixed hyperlipidemia: Secondary | ICD-10-CM

## 2020-07-17 DIAGNOSIS — R600 Localized edema: Secondary | ICD-10-CM

## 2020-07-17 DIAGNOSIS — E785 Hyperlipidemia, unspecified: Secondary | ICD-10-CM

## 2020-07-17 DIAGNOSIS — I6522 Occlusion and stenosis of left carotid artery: Secondary | ICD-10-CM | POA: Diagnosis not present

## 2020-07-17 DIAGNOSIS — I351 Nonrheumatic aortic (valve) insufficiency: Secondary | ICD-10-CM | POA: Diagnosis not present

## 2020-07-17 DIAGNOSIS — I10 Essential (primary) hypertension: Secondary | ICD-10-CM | POA: Diagnosis not present

## 2020-07-17 DIAGNOSIS — I471 Supraventricular tachycardia, unspecified: Secondary | ICD-10-CM

## 2020-07-17 NOTE — Patient Instructions (Signed)

## 2020-07-17 NOTE — Progress Notes (Signed)
Cardiology Office Note:    Date:  07/17/2020   ID:  Marcia Norris, DOB November 08, 1945, MRN [id number]  PCP:  Vicenta Aly, [place] Prov[facility] Cardiologist:  Ena Dawley, MD (Inactive) {  Referring MD: Vicenta Aly, FNP    History of Present Illness:    Marcia Norris is a 75 y.o. female with a hx of SVT status post AVRT/AVNRT ablation, chronic left bundle branch block, hypertension, hyperlipidemia, mild carotid artery stenosis, with history of OSA who was previously followed by Dr. [name] who now presents to clinic for follow-up.  Last saw Sammuel Hines, NP on 05/17/20 for preoperative clearance prior to shoulder arthroplasty on 05/30/20. She was doing well from CV standpoint. TTE obtained 06/13/20 showed LVEF 60-65%, mild aortic sclerosis, no significant valve disease  Today, she is doing well. She is preparing to undergo a reverse shoulder arthoplasty procedure soon, she notes that she was delay some secondary to having a UTI and kidney stones. However she has been treated and her symptoms have completely resolved. Her surgery has been delay to July. She denies any dysuria or hematuria symptoms at this time.   Her at home blood pressure is typically 120/70-80s, it increases mainly in the mornings.  She does have some lower extremity swelling but this is her baseline. She props her feet up and her swelling decreases. She has no lightheadedness, pre-syncopal, syncopal episodes. She is eating reasonably healthy, she cooks more at home and tries not to salt her foods. She denies any exertional chest pain, tightness, or pressure, orthopnea, palpitations, PND.  Past Medical History:  Diagnosis Date  . Arthritis   . Asthma   . Atrial flutter ([place])   . Cancer ([place])    skin. lt shoulder  . Diabetes mellitus without complication (HCC)    borderline, diet controlled, no meds  . Diverticulosis   . Exertional shortness of breath   . Heart murmur   . Hyperglycemia   .  Hyperlipidemia   . Hypertension   . OSA on CPAP 02/16/2012   wears CPAP machine at night, but takes it off during the night; pt stated "I was told I have mild sleep apnea"  . Seasonal allergies   . Varicose veins   . Vitamin D deficiency     Past Surgical History:  Procedure Laterality Date  . ABDOMINAL HYSTERECTOMY  1990's   partial  . ATRIAL FLUTTER ABLATION  01/27/2013  . ATRIAL FLUTTER ABLATION N/A 01/27/2013   Procedure: ATRIAL FLUTTER ABLATION;  Surgeon: Evans Lance, MD;  Location: [facility] C[facility] LAB;  Service: Cardiovascular;  Laterality: N/A;  . BACK SURGERY  04/2013  . CATARACT EXTRACTION W/ INTRAOCULAR LENS  IMPLANT, BILATERAL Bilateral 10-12/2012  . COLONOSCOPY    . CYSTOSCOPY     "multiple after bladder tumor removed; released in 2013" (01/27/2013)  . TRANSURETHRAL RESECTION OF BLADDER TUMOR  ~ 2004    Current Medications: Current Meds  Medication Sig  . ALPRAZolam (XANAX) 0.5 MG tablet Take 0.25 mg by mouth at bedtime as needed for sleep.  Marland Kitchen aspirin 81 MG tablet Take 81 mg by mouth every morning.  Marland Kitchen atorvastatin (LIPITOR) 40 MG tablet Take 40 mg by mouth every evening.  . Cholecalciferol (VITAMIN D3 MAXIMUM STRENGTH) 125 MCG (5000 UT) capsule Take 5,000 Units by mouth daily.  . metFORMIN (GLUCOPHAGE) 500 MG tablet Take 500-1,000 mg by mouth See admin instructions. Take 500 mg in the morning and 1000 mg in the evening  . Multiple  Vitamins-Minerals (ONE-A-DAY 50 PLUS PO) Take 1 tablet by mouth 3 (three) times a week.  . valsartan (DIOVAN) 80 MG tablet Take 1 tablet (80 mg total) by mouth daily.     Allergies:   Latex and Codeine   Social History   Socioeconomic History  . Marital status: Married    Spouse name: Not on file  . Number of children: 2  . Years of education: 71  . Highest education level: Not on file  Occupational History  . Occupation: retired  Tobacco Use  . Smoking status: Never Smoker  . Smokeless tobacco: Never Used  Vaping Use  . Vaping  Use: Never used  Substance and Sexual Activity  . Alcohol use: Yes    Alcohol/week: 0.0 standard drinks    Comment: Occasional glass of wine  . Drug use: No  . Sexual activity: Yes  Other Topics Concern  . Not on file  Social History Narrative   Lives at home with her husband.   Right-handed.   1 cups caffeine/day.      Social Determinants of Health   Financial Resource Strain: Not on file  Food Insecurity: Not on file  Transportation Needs: Not on file  Physical Activity: Not on file  Stress: Not on file  Social Connections: Not on file     Family History: The patient's family history includes Allergies in her mother; Bladder Cancer in her father; Cancer in her mother; Diabetes in her father and sister; Heart disease in her father; Hyperlipidemia in her father; Hypertension in her father; Kidney disease in her father; Varicose Veins in her mother. There is no history of Colon cancer.  ROS:   Review of Systems  Constitutional: Negative for chills and fever.  HENT: Negative for ear pain, hearing loss, sinus pain, sore throat and tinnitus.   Respiratory: Negative for cough and shortness of breath.   Cardiovascular: Positive for leg swelling (mild). Negative for chest pain and palpitations.  Gastrointestinal: Negative for blood in stool, constipation, diarrhea and vomiting.  Genitourinary: Negative for frequency, hematuria and urgency.  Musculoskeletal: Negative for back pain.  Neurological: Negative for dizziness, tingling and headaches.     EKGs/Labs/Other Studies Reviewed:    The following studies were reviewed today: TTE 06/19/20: IMPRESSIONS    1. Left ventricular ejection fraction, by estimation, is 60 to 65%. The  left ventricle has normal function. The left ventricle has no regional  wall motion abnormalities. There is mild concentric left ventricular  hypertrophy most notably in the  basal-septal segment. Left ventricular diastolic parameters are consistent   with Grade I diastolic dysfunction (impaired relaxation).  2. Right ventricular systolic function is normal. The right ventricular  size is normal. There is normal pulmonary artery systolic pressure.  3. Left atrial size was mildly dilated.  4. The mitral valve is normal in structure. Trivial mitral valve  regurgitation.  5. The aortic valve is tricuspid. There is mild thickening of the aortic  valve. Aortic valve regurgitation is not visualized. Mild aortic valve  sclerosis is present, with no evidence of aortic valve stenosis.  6. The inferior vena cava is normal in size with greater than 50%  respiratory variability, suggesting right atrial pressure of 3 mmHg.   Comparison(s): Compared to prior echo in 06/19/2016, there is no significant  change.   Echocardiogram 08/04/2016  Left ventricle: The cavity size was normal. There was mild focal  basal hypertrophy of the septum. Systolic function was normal.  The estimated ejection fraction was  in the range of 55% to 60%.  Wall motion was normal; there were no regional wall motion  abnormalities. Doppler parameters are consistent with abnormal  left ventricular relaxation (grade 1 diastolic dysfunction).  - Ventricular septum: Septal motion showed paradox. These changes  are consistent with a left bundle branch block.  - Mitral valve: There was mild regurgitation directed centrally.     EKG:   5/18-EKG is was not ordered today.    Recent Labs: 05/20/2020: BUN 15; Creatinine, Ser 0.74; Hemoglobin 13.4; Platelets 220; Potassium 3.9; Sodium 143  Recent Lipid Panel No results found for: CHOL, TRIG, HDL, CHOLHDL, VLDL, LDLCALC, LDLDIRECT     Physical Exam:    VS:  BP (!) 156/80   Pulse 85   Ht 5\' 6"  (1.676 m)   Wt 169 lb 12.8 oz (77 kg)   SpO2 95%   BMI 27.41 kg/m     Wt Readings from Last 3 Encounters:  07/17/20 169 lb 12.8 oz (77 kg)  05/30/20 171 lb 1.2 oz (77.6 kg)  05/20/20 171 lb (77.6 kg)     GEN:  Well nourished, well developed in no acute distress HEENT: Normal NECK: No JVD; No carotid bruits LYMPHATICS: No lymphadenopathy CARDIAC: RRR, 2/6 systolic murmurs, no rubs, gallops RESPIRATORY:  Clear to auscultation without rales, wheezing or rhonchi  ABDOMEN: Soft, non-tender, non-distended MUSCULOSKELETAL:  No edema; No deformity  SKIN: Warm and dry NEUROLOGIC:  Alert and oriented x 3 PSYCHIATRIC:  Normal affect   ASSESSMENT:    1. SVT (supraventricular tachycardia) (HCC)   2. Stenosis of left carotid artery   3. Essential hypertension   4. Aortic ejection murmur   5. Hyperlipidemia, unspecified hyperlipidemia type   6. Lower extremity edema   7. Mixed hyperlipidemia    PLAN:    In order of problems listed above:  #SVT s/p ablation: Doing well with no recurrence of palpitations. -Continue to monitor; no palpitations  #Pre-operative evaluation prior to reverse shoulder arthroplasty: Previously seen on 05/17/20 and cleared for surgery without further cardiac work-up due to excellent functional capacity. Surgery delayed due to UTI/renal stones which have since resolved. Again, no further testing is indicated at this time and patient is cleared from CV standpoint.   #HTN: Well controlled at home running mainly 120s/80s. No orthostatic symptoms. -Continue valsartan 80mg  daily  #HLD: LDL 89 in 12/2019 -Continue lipitor 40mg  daily  #Mild Left Carotid Artery Stenosis: 1-39% on ultrasound on 05/23/20. -Stable; continue lipitor 40mg  as above  #Systolic Murmur: TTE with normal LVEF, mild aortic valve sclerosis without significant stenosis. No other valve disease. -TTE reassuring with no significant valve disease  #DMII: Managed by PCP. -Continue metformin 500/1000mg     Medication Adjustments/Labs and Tests Ordered: Current medicines are reviewed at length with the patient today.  Concerns regarding medicines are outlined above.  No orders of the defined types were  placed in this encounter.  No orders of the defined types were placed in this encounter.   Patient Instructions  Medication Instructions:   Your physician recommends that you continue on your current medications as directed. Please refer to the Current Medication list given to you today.  *If you need a refill on your cardiac medications before your next appointment, please call your pharmacy*   Follow-Up: At [facility], you and your health needs are our priority.  As part of our continuing mission to provide you with exceptional heart care, we have created designated Provider Care Teams.  These Care Teams include  your primary Cardiologist (physician) and Advanced Practice Providers (APPs -  Physician Assistants and Nurse Practitioners) who all work together to provide you with the care you need, when you need it.  We recommend signing up for the patient portal called "MyChart".  Sign up information is provided on this After Visit Summary.  MyChart is used to connect with patients for Virtual Visits (Telemedicine).  Patients are able to view lab/test results, encounter notes, upcoming appointments, etc.  Non-urgent messages can be sent to your provider as well.   To learn more about what you can do with MyChart, go to [email or web address].    Your next appointment:   1 year(s)  The format for your next appointment:   In Person  Provider:   Gwyndolyn Kaufman, MD         I,Alexis Bryant,acting as a scribe for Freada Bergeron, MD.,have documented all relevant documentation on the behalf of Freada Bergeron, MD,as directed by  Freada Bergeron, MD while in the presence of Freada Bergeron, MD.  I, Freada Bergeron, MD, have reviewed all documentation for this visit. The documentation on 07/17/20 for the exam, diagnosis, procedures, and orders are all accurate and complete. Signed, Freada Bergeron, MD  07/17/2020 10:20 AM    [place]

## 2020-08-09 ENCOUNTER — Other Ambulatory Visit: Payer: Self-pay

## 2020-08-09 MED ORDER — VALSARTAN 80 MG PO TABS: 80.0000 mg | ORAL_TABLET | Freq: Every day | ORAL | 3 refills | Status: DC

## 2020-08-26 NOTE — Patient Instructions (Addendum)
DUE TO COVID-19 ONLY ONE VISITOR IS ALLOWED TO COME WITH YOU AND STAY IN THE WAITING ROOM ONLY DURING PRE OP AND PROCEDURE DAY OF SURGERY. THE 1 VISITOR  MAY VISIT WITH YOU AFTER SURGERY IN YOUR PRIVATE ROOM DURING VISITING HOURS ONLY!                 [place]     Your procedure is scheduled on: 09/05/20   Report to [facility] Main  Entrance   Report to admitting at  7:30 AM     Call this number if you have problems the morning of surgery [place], NO CHEWING GUM [place].   No food after midnight.    You may have clear liquid until 4:30 AM.    At 4:00 AM drink pre surgery drink.   Nothing by mouth after 4:30 AM.    Take these medicines the morning of surgery with A SIP OF WATER: none  DO NOT TAKE ANY DIABETIC MEDICATIONS DAY OF YOUR SURGERY                               You may not have any metal on your body including hair pins and              piercings  Do not wear jewelry, make-up, lotions, powders or perfumes, deodorant             Do not wear nail polish on your fingernails.  Do not shave  48 hours prior to surgery.               Do not bring valuables to the hospital. [place].  Contacts, dentures or bridgework may not be worn into surgery.       Patients discharged the day of surgery will not be allowed to drive home.   IF YOU ARE HAVING SURGERY AND GOING HOME THE SAME DAY, YOU MUST HAVE AN ADULT TO DRIVE YOU HOME AND BE WITH YOU FOR 24 HOURS.   YOU MAY GO HOME BY TAXI OR UBER OR ORTHERWISE, BUT AN ADULT MUST ACCOMPANY YOU HOME AND STAY WITH YOU FOR 24 HOURS.  Name and phone number of your driver:  Special Instructions: N/A              Please read over the following fact sheets you were given: _____________________________________________________________________             [facility]- Preparing for Total Shoulder  Arthroplasty    Before surgery, you can play an important role. Because skin is not sterile, your skin needs to be as free of germs as possible. You can reduce the number of germs on your skin by using the following products. Benzoyl Peroxide Gel Reduces the number of germs present on the skin Applied twice a day to shoulder area starting two days before surgery    ==================================================================  Please follow these instructions carefully:  BENZOYL PEROXIDE 5% GEL  Please do not use if you have an allergy to benzoyl peroxide.   If your skin becomes reddened/irritated stop using the benzoyl peroxide.  Starting two days before surgery, apply as follows: Apply benzoyl peroxide in the morning and at night. Apply after taking  a shower. If you are not taking a shower clean entire shoulder front, back, and side along with the armpit with a clean wet washcloth.  Place a quarter-sized dollop on your shoulder and rub in thoroughly, making sure to cover the front, back, and side of your shoulder, along with the armpit.   2 days before ____ AM   ____ PM              1 day before ____ AM   ____ PM                         Do this twice a day for two days.  (Last application is the night before surgery, AFTER using the CHG soap as described below).  Do NOT apply benzoyl peroxide gel on the day of surgery.    [place] - Preparing for Surgery  Before surgery, you can play an important role.  Because skin is not sterile, your skin needs to be as free of germs as possible.  You can reduce the number of germs on your skin by washing with CHG (chlorahexidine gluconate) soap before surgery.  CHG is an antiseptic cleaner which kills germs and bonds with the skin to continue killing germs even after washing. Please DO NOT use if you have an allergy to CHG or antibacterial soaps.  If your skin becomes reddened/irritated stop using the CHG and inform your nurse when you  arrive at Short Stay. Do not shave (including legs and underarms) for at least 48 hours prior to the first CHG shower.  You may shave your face/neck. Please follow these instructions carefully:  1.  Shower with CHG Soap the night before surgery and the  morning of Surgery.  2.  If you choose to wash your hair, wash your hair first as usual with your  normal  shampoo.  3.  After you shampoo, rinse your hair and body thoroughly to remove the  shampoo.                                        4.  Use CHG as you would any other liquid soap.  You can apply chg directly  to the skin and wash                       Gently with a scrungie or clean washcloth.  5.  Apply the CHG Soap to your body ONLY FROM THE NECK DOWN.   Do not use on face/ open                           Wound or open sores. Avoid contact with eyes, ears mouth and genitals (private parts).                       Wash face,  Genitals (private parts) with your normal soap.             6.  Wash thoroughly, paying special attention to the area where your surgery  will be performed.  7.  Thoroughly rinse your body with warm water from the neck down.  8.  DO NOT shower/wash with your normal soap after using and rinsing off  the CHG Soap.  9.  Pat yourself dry with a clean towel.            10.  Wear clean pajamas.            11.  Place clean sheets on your bed the night of your first shower and do not  sleep with pets. Day of Surgery : Do not apply any lotions/deodorants the morning of surgery.  Please wear clean clothes to the hospital/surgery center.  FAILURE TO FOLLOW THESE INSTRUCTIONS MAY RESULT IN THE CANCELLATION OF YOUR SURGERY PATIENT SIGNATURE_________________________________  NURSE SIGNATURE__________________________________  ________________________________________________________________________   Marcia Norris  An incentive spirometer is a tool that can help keep your lungs clear and active. This tool  measures how well you are filling your lungs with each breath. Taking long deep breaths may help reverse or decrease the chance of developing breathing (pulmonary) problems (especially infection) following: A long period of time when you are unable to move or be active. BEFORE THE PROCEDURE  If the spirometer includes an indicator to show your best effort, your nurse or respiratory therapist will set it to a desired goal. If possible, sit up straight or lean slightly forward. Try not to slouch. Hold the incentive spirometer in an upright position. INSTRUCTIONS FOR USE  Sit on the edge of your bed if possible, or sit up as far as you can in bed or on a chair. Hold the incentive spirometer in an upright position. Breathe out normally. Place the mouthpiece in your mouth and seal your lips tightly around it. Breathe in slowly and as deeply as possible, raising the piston or the ball toward the top of the column. Hold your breath for 3-5 seconds or for as long as possible. Allow the piston or ball to fall to the bottom of the column. Remove the mouthpiece from your mouth and breathe out normally. Rest for a few seconds and repeat Steps 1 through 7 at least 10 times every 1-2 hours when you are awake. Take your time and take a few normal breaths between deep breaths. The spirometer may include an indicator to show your best effort. Use the indicator as a goal to work toward during each repetition. After each set of 10 deep breaths, practice coughing to be sure your lungs are clear. If you have an incision (the cut made at the time of surgery), support your incision when coughing by placing a pillow or rolled up towels firmly against it. Once you are able to get out of bed, walk around indoors and cough well. You may stop using the incentive spirometer when instructed by your caregiver.  RISKS AND COMPLICATIONS Take your time so you do not get dizzy or light-headed. If you are in pain, you may need to  take or ask for pain medication before doing incentive spirometry. It is harder to take a deep breath if you are having pain. AFTER USE Rest and breathe slowly and easily. It can be helpful to keep track of a log of your progress. Your caregiver can provide you with a simple table to help with this. If you are using the spirometer at home, follow these instructions: [place] IF:  You are having difficultly using the spirometer. You have trouble using the spirometer as often as instructed. Your pain medication is not giving enough relief while using the spirometer. You develop fever of 100.5 F (38.1 C) or higher. SEEK IMMEDIATE MEDICAL CARE IF:  You cough up bloody sputum that had  not been present before. You develop fever of 102 F (38.9 C) or greater. You develop worsening pain at or near the incision site. MAKE SURE YOU:  Understand these instructions. Will watch your condition. Will get help right away if you are not doing well or get worse. Document Released: 06/29/2006 Document Revised: 05/11/2011 Document Reviewed: 08/30/2006 [facility] Patient Information 2014 [place], [place].   ________________________________________________________________________

## 2020-08-27 ENCOUNTER — Other Ambulatory Visit: Payer: Self-pay

## 2020-08-27 ENCOUNTER — Encounter (HOSPITAL_COMMUNITY): Payer: Self-pay

## 2020-08-27 ENCOUNTER — Encounter (HOSPITAL_COMMUNITY)
Admission: RE | Admit: 2020-08-27 | Discharge: 2020-08-27 | Disposition: A | Discharge status: Home / Self Care | Payer: Medicare PPO | Source: Ambulatory Visit | Attending: Orthopedic Surgery | Admitting: Orthopedic Surgery

## 2020-08-27 DIAGNOSIS — Z01818 Encounter for other preprocedural examination: Secondary | ICD-10-CM | POA: Diagnosis not present

## 2020-08-27 LAB — SURGICAL PCR SCREEN
MRSA, PCR: NEGATIVE
Staphylococcus aureus: NEGATIVE

## 2020-08-27 LAB — GLUCOSE, CAPILLARY: Glucose-Capillary: 123 mg/dL — ABNORMAL HIGH (ref 70–99)

## 2020-08-27 NOTE — Progress Notes (Addendum)
COVID Vaccine Completed:Yes Date COVID Vaccine completed:04/05/20 COVID vaccine manufacturer: [employer]  PCP - Vicenta Aly FNP LOV 05/30/20 Cardiologist - Dr. Liane Comber  LOV 09/01/19  Chest x-ray - no EKG - 05/17/20-epic Stress Test - no ECHO - 06/13/20-epic Cardiac Cath - no Pacemaker/ICD device last checked:  Sleep Study - yes CPAP - yes  Fasting Blood Sugar - 122-148 Checks Blood Sugar _____ times a day3 times a week  Blood Thinner Instructions:ASA 81/ Vicenta Aly Aspirin Instructions:none Last Dose:Pt wants to stop 08/27/20  Anesthesia review: done 05/20/20 ( surgery cancelled because of UTI on DOS)  Patient denies shortness of breath, fever, cough and chest pain at PAT appointment  Yes. No SOB with any activity Patient verbalized understanding of instructions that were given to them at the PAT appointment. Patient was also instructed that they will need to review over the PAT instructions again at home before surgery. yes

## 2020-08-28 NOTE — Progress Notes (Signed)
Anesthesia Chart Review   Case: [id number] Date/Time: 09/05/20 0945   Procedure: REVERSE SHOULDER ARTHROPLASTY (Right: Shoulder)   Anesthesia type: General   Pre-op diagnosis: Right shoulder osteoarthritis, rotator cuff dysfunction   Location: [facility] ROOM 06 / [facility]   Surgeons: Justice Britain, MD       DISCUSSION:75 y.o. never smoker with h/o DM II (A1C 7 08/23/2020), OSA w/CPAP, HTN, SVT s/p AVRT/AVR NT ablation, chronic LBBB, bladder cancer, right shoulder OA, rotator cuff dysfunction scheduled for above procedure 09/05/2020 with Dr. Justice Britain.   Pt last seen by cardiology 05/17/2020. Per OV note, "Preop cardiac evaluation: She is doing well.  Chart reviewed as part of pre-operative protocol coverage. Given past medical history and time since last visit, based on ACC/AHA guidelines, Marcia Norris would be at acceptable risk for the planned procedure without further cardiovascular testing. This  will be route this recommendation to the requesting party via Epic fax function and remove from pre-op pool.Please call with questions."  Surgery previously cancelled DOS due to new onset hematuria.  She was seen by urology who performed a cystoscopy which was normal. She also underwent a CTAP which revealed a known renal cyst and left sided kidney stones. Completed a course of Macrobid in April for UTI. Asymptomatic at PAT visit 08/27/2020.  VS: BP (!) 155/75   Pulse 69   Temp 36.7 C (Oral)   Resp 20   Ht 5\' 6"  (1.676 m)   Wt 76.2 kg   SpO2 99%   BMI 27.12 kg/m   PROVIDERS: Vicenta Aly, FNP is PCP   Ena Dawley, MD is Cardiologist  LABS: Labs reviewed: Acceptable for surgery. and labs in [place] (all labs ordered are listed, but only abnormal results are displayed)  Labs Reviewed  GLUCOSE, CAPILLARY - Abnormal; Notable for the following components:      Result Value   Glucose-Capillary 123 (*)    All other components within normal limits  SURGICAL PCR SCREEN      IMAGES:   EKG: 05/17/2020 Rate 84 bpm  NSR LBBB  CV: Echo 06/13/2020  1. Left ventricular ejection fraction, by estimation, is 60 to 65%. The  left ventricle has normal function. The left ventricle has no regional  wall motion abnormalities. There is mild concentric left ventricular  hypertrophy most notably in the  basal-septal segment. Left ventricular diastolic parameters are consistent  with Grade I diastolic dysfunction (impaired relaxation).   2. Right ventricular systolic function is normal. The right ventricular  size is normal. There is normal pulmonary artery systolic pressure.   3. Left atrial size was mildly dilated.   4. The mitral valve is normal in structure. Trivial mitral valve  regurgitation.   5. The aortic valve is tricuspid. There is mild thickening of the aortic  valve. Aortic valve regurgitation is not visualized. Mild aortic valve  sclerosis is present, with no evidence of aortic valve stenosis.   6. The inferior vena cava is normal in size with greater than 50%  respiratory variability, suggesting right atrial pressure of 3 mmHg. Past Medical History:  Diagnosis Date   Arthritis    Asthma    age 37   Atrial flutter ([place])    Cancer ([place]) 2021   skin. lt shoulder   Diabetes mellitus without complication (HCC)    borderline, diet controlled, no meds   Diverticulosis    Exertional shortness of breath    Heart murmur    Hyperglycemia    Hyperlipidemia  Hypertension    OSA on CPAP 02/16/2012   wears CPAP machine at night, but takes it off during the night; pt stated "I was told I have mild sleep apnea"   Seasonal allergies    Varicose veins    Vitamin D deficiency     Past Surgical History:  Procedure Laterality Date   ABDOMINAL HYSTERECTOMY  1990's   partial   ATRIAL FLUTTER ABLATION  01/27/2013   ATRIAL FLUTTER ABLATION N/A 01/27/2013   Procedure: ATRIAL FLUTTER ABLATION;  Surgeon: Evans Lance, MD;  Location: [facility] CATH LAB;   Service: Cardiovascular;  Laterality: N/A;   BACK SURGERY  04/2013   CATARACT EXTRACTION W/ INTRAOCULAR LENS  IMPLANT, BILATERAL Bilateral 10-12/2012   COLONOSCOPY     CYSTOSCOPY     "multiple after bladder tumor removed; released in 2013" (01/27/2013)   TRANSURETHRAL RESECTION OF BLADDER TUMOR  ~ 2004    MEDICATIONS:  ALPRAZolam (XANAX) 0.5 MG tablet   aspirin 81 MG tablet   atorvastatin (LIPITOR) 40 MG tablet   metFORMIN (GLUCOPHAGE-XR) 500 MG 24 hr tablet   Omega-3 Fatty Acids (FISH OIL PO)   valsartan (DIOVAN) 80 MG tablet   No current facility-administered medications for this encounter.     Konrad Felix, PA-C [facility]-Surgical Testing [phone]

## 2020-08-28 NOTE — Anesthesia Preprocedure Evaluation (Addendum)
Anesthesia Evaluation  Patient identified by MRN, date of birth, ID band Patient awake    Reviewed: Allergy & Precautions, NPO status , Patient's Chart, lab work & pertinent test results  History of Anesthesia Complications Negative for: history of anesthetic complications  Airway Mallampati: II  TM Distance: >3 FB Neck ROM: Full    Dental  (+) Dental Advisory Given   Pulmonary sleep apnea and Continuous Positive Airway Pressure Ventilation ,    breath sounds clear to auscultation       Cardiovascular hypertension, Pt. on medications (-) angina+ dysrhythmias Atrial Fibrillation  Rhythm:Regular Rate:Normal  05/2020 ECHO: EF 60-65%, normal LVF, mild LVH, Grade 1 DD, trivial MR   Neuro/Psych Anxiety Depression negative neurological ROS     GI/Hepatic negative GI ROS, Neg liver ROS,   Endo/Other  diabetes (glu 129), Oral Hypoglycemic Agents  Renal/GU negative Renal ROS     Musculoskeletal  (+) Arthritis ,   Abdominal   Peds  Hematology negative hematology ROS (+)   Anesthesia Other Findings   Reproductive/Obstetrics                           Anesthesia Physical Anesthesia Plan  ASA: 3  Anesthesia Plan: General   Post-op Pain Management: GA combined w/ Regional for post-op pain   Induction: Intravenous  PONV Risk Score and Plan: 3 and Ondansetron, Dexamethasone and Treatment may vary due to age or medical condition  Airway Management Planned: Oral ETT  Additional Equipment: None  Intra-op Plan:   Post-operative Plan: Extubation in OR  Informed Consent: I have reviewed the patients History and Physical, chart, labs and discussed the procedure including the risks, benefits and alternatives for the proposed anesthesia with the patient or authorized representative who has indicated his/her understanding and acceptance.     Dental advisory given  Plan Discussed with:   Anesthesia  Plan Comments: (See PAT note 08/27/2020, Konrad Felix, PA-C Plan routine monitors, GETA with interscalene block for post op analgesia)      Anesthesia Quick Evaluation

## 2020-09-05 ENCOUNTER — Ambulatory Visit (HOSPITAL_COMMUNITY): Payer: Medicare PPO | Admitting: Certified Registered"

## 2020-09-05 ENCOUNTER — Ambulatory Visit (HOSPITAL_COMMUNITY)
Admission: RE | Admit: 2020-09-05 | Discharge: 2020-09-05 | Disposition: A | Discharge status: Home / Self Care | Payer: Medicare PPO | Source: Ambulatory Visit | Attending: Orthopedic Surgery | Admitting: Orthopedic Surgery

## 2020-09-05 ENCOUNTER — Encounter (HOSPITAL_COMMUNITY): Payer: Self-pay | Admitting: Orthopedic Surgery

## 2020-09-05 ENCOUNTER — Encounter (HOSPITAL_COMMUNITY)
Admission: RE | Disposition: A | Discharge status: Home / Self Care | Payer: Self-pay | Source: Ambulatory Visit | Attending: Orthopedic Surgery

## 2020-09-05 ENCOUNTER — Ambulatory Visit (HOSPITAL_COMMUNITY): Payer: Medicare PPO | Admitting: Physician Assistant

## 2020-09-05 DIAGNOSIS — Z7984 Long term (current) use of oral hypoglycemic drugs: Secondary | ICD-10-CM | POA: Diagnosis not present

## 2020-09-05 DIAGNOSIS — Z79899 Other long term (current) drug therapy: Secondary | ICD-10-CM | POA: Diagnosis not present

## 2020-09-05 DIAGNOSIS — M19011 Primary osteoarthritis, right shoulder: Secondary | ICD-10-CM | POA: Diagnosis present

## 2020-09-05 DIAGNOSIS — Z7982 Long term (current) use of aspirin: Secondary | ICD-10-CM | POA: Diagnosis not present

## 2020-09-05 DIAGNOSIS — M25711 Osteophyte, right shoulder: Secondary | ICD-10-CM | POA: Diagnosis not present

## 2020-09-05 DIAGNOSIS — M24611 Ankylosis, right shoulder: Secondary | ICD-10-CM | POA: Diagnosis not present

## 2020-09-05 HISTORY — PX: REVERSE SHOULDER ARTHROPLASTY: SHX5054

## 2020-09-05 LAB — GLUCOSE, CAPILLARY
Glucose-Capillary: 129 mg/dL — ABNORMAL HIGH (ref 70–99)
Glucose-Capillary: 140 mg/dL — ABNORMAL HIGH (ref 70–99)

## 2020-09-05 SURGERY — ARTHROPLASTY, SHOULDER, TOTAL, REVERSE
Anesthesia: General | Site: Shoulder | Laterality: Right

## 2020-09-05 MED ORDER — NAPROXEN 500 MG PO TABS: 500.0000 mg | ORAL_TABLET | Freq: Two times a day (BID) | ORAL | 1 refills | Status: DC

## 2020-09-05 MED ORDER — FENTANYL CITRATE (PF) 100 MCG/2ML IJ SOLN
50.0000 ug | Freq: Once | INTRAMUSCULAR | Status: AC
  Administered 2020-09-05: 50 ug via INTRAVENOUS
  Filled 2020-09-05: qty 2

## 2020-09-05 MED ORDER — ONDANSETRON HCL 4 MG PO TABS: 4.0000 mg | ORAL_TABLET | Freq: Three times a day (TID) | ORAL | 0 refills | Status: DC | PRN

## 2020-09-05 MED ORDER — ONDANSETRON HCL 4 MG/2ML IJ SOLN
INTRAMUSCULAR | Status: DC | PRN
  Administered 2020-09-05: 4 mg via INTRAVENOUS

## 2020-09-05 MED ORDER — ROCURONIUM BROMIDE 10 MG/ML (PF) SYRINGE
PREFILLED_SYRINGE | INTRAVENOUS | Status: AC
  Filled 2020-09-05: qty 10

## 2020-09-05 MED ORDER — DEXAMETHASONE SODIUM PHOSPHATE 10 MG/ML IJ SOLN
INTRAMUSCULAR | Status: AC
  Filled 2020-09-05: qty 1

## 2020-09-05 MED ORDER — PROPOFOL 10 MG/ML IV BOLUS
INTRAVENOUS | Status: DC | PRN
  Administered 2020-09-05: 150 mg via INTRAVENOUS

## 2020-09-05 MED ORDER — CHLORHEXIDINE GLUCONATE 0.12 % MT SOLN
15.0000 mL | Freq: Once | OROMUCOSAL | Status: AC
  Administered 2020-09-05: 15 mL via OROMUCOSAL

## 2020-09-05 MED ORDER — ACETAMINOPHEN 500 MG PO TABS: 1000.0000 mg | ORAL_TABLET | Freq: Once | ORAL | Status: DC

## 2020-09-05 MED ORDER — PROPOFOL 10 MG/ML IV BOLUS
INTRAVENOUS | Status: AC
  Filled 2020-09-05: qty 20

## 2020-09-05 MED ORDER — BUPIVACAINE-EPINEPHRINE (PF) 0.5% -1:200000 IJ SOLN
INTRAMUSCULAR | Status: DC | PRN
  Administered 2020-09-05: 10 mL via PERINEURAL

## 2020-09-05 MED ORDER — SUGAMMADEX SODIUM 200 MG/2ML IV SOLN
INTRAVENOUS | Status: DC | PRN
  Administered 2020-09-05: 200 mg via INTRAVENOUS

## 2020-09-05 MED ORDER — BUPIVACAINE LIPOSOME 1.3 % IJ SUSP
INTRAMUSCULAR | Status: DC | PRN
  Administered 2020-09-05: 10 mL via PERINEURAL

## 2020-09-05 MED ORDER — LACTATED RINGERS IV BOLUS
500.0000 mL | Freq: Once | INTRAVENOUS | Status: AC
  Administered 2020-09-05: 500 mL via INTRAVENOUS

## 2020-09-05 MED ORDER — CEFAZOLIN SODIUM-DEXTROSE 2-4 GM/100ML-% IV SOLN
2.0000 g | INTRAVENOUS | Status: AC
  Administered 2020-09-05: 2 g via INTRAVENOUS
  Filled 2020-09-05: qty 100

## 2020-09-05 MED ORDER — LACTATED RINGERS IV SOLN: INTRAVENOUS | Status: DC

## 2020-09-05 MED ORDER — PROMETHAZINE HCL 25 MG/ML IJ SOLN: 6.2500 mg | INTRAMUSCULAR | Status: DC | PRN

## 2020-09-05 MED ORDER — STERILE WATER FOR IRRIGATION IR SOLN
Status: DC | PRN
  Administered 2020-09-05: 2000 mL

## 2020-09-05 MED ORDER — OXYCODONE HCL 5 MG PO TABS: 5.0000 mg | ORAL_TABLET | Freq: Once | ORAL | Status: DC | PRN

## 2020-09-05 MED ORDER — SUCCINYLCHOLINE CHLORIDE 200 MG/10ML IV SOSY
PREFILLED_SYRINGE | INTRAVENOUS | Status: DC | PRN
  Administered 2020-09-05: 100 mg via INTRAVENOUS

## 2020-09-05 MED ORDER — MIDAZOLAM HCL 2 MG/2ML IJ SOLN
1.0000 mg | Freq: Once | INTRAMUSCULAR | Status: AC
  Administered 2020-09-05: 1 mg via INTRAVENOUS
  Filled 2020-09-05: qty 2

## 2020-09-05 MED ORDER — OXYCODONE HCL 5 MG/5ML PO SOLN: 5.0000 mg | Freq: Once | ORAL | Status: DC | PRN

## 2020-09-05 MED ORDER — LACTATED RINGERS IV BOLUS
250.0000 mL | Freq: Once | INTRAVENOUS | Status: AC
  Administered 2020-09-05: 250 mL via INTRAVENOUS

## 2020-09-05 MED ORDER — LIDOCAINE 2% (20 MG/ML) 5 ML SYRINGE
INTRAMUSCULAR | Status: AC
  Filled 2020-09-05: qty 5

## 2020-09-05 MED ORDER — ROCURONIUM BROMIDE 10 MG/ML (PF) SYRINGE
PREFILLED_SYRINGE | INTRAVENOUS | Status: DC | PRN
  Administered 2020-09-05: 40 mg via INTRAVENOUS
  Administered 2020-09-05 (×2): 10 mg via INTRAVENOUS

## 2020-09-05 MED ORDER — CYCLOBENZAPRINE HCL 10 MG PO TABS: 10.0000 mg | ORAL_TABLET | Freq: Three times a day (TID) | ORAL | 1 refills | Status: DC | PRN

## 2020-09-05 MED ORDER — MEPERIDINE HCL 50 MG/ML IJ SOLN: 6.2500 mg | INTRAMUSCULAR | Status: DC | PRN

## 2020-09-05 MED ORDER — ONDANSETRON HCL 4 MG/2ML IJ SOLN
INTRAMUSCULAR | Status: AC
  Filled 2020-09-05: qty 2

## 2020-09-05 MED ORDER — FENTANYL CITRATE (PF) 250 MCG/5ML IJ SOLN
INTRAMUSCULAR | Status: AC
  Filled 2020-09-05: qty 5

## 2020-09-05 MED ORDER — VANCOMYCIN HCL 1000 MG IV SOLR
INTRAVENOUS | Status: AC
  Filled 2020-09-05: qty 1000

## 2020-09-05 MED ORDER — HYDROMORPHONE HCL 1 MG/ML IJ SOLN
0.2500 mg | INTRAMUSCULAR | Status: DC | PRN
Start: 2020-09-05 — End: 2020-09-05

## 2020-09-05 MED ORDER — MIDAZOLAM HCL 2 MG/2ML IJ SOLN
0.5000 mg | Freq: Once | INTRAMUSCULAR | Status: DC | PRN
Start: 2020-09-05 — End: 2020-09-05

## 2020-09-05 MED ORDER — TRANEXAMIC ACID 1000 MG/10ML IV SOLN: 1000.0000 mg | INTRAVENOUS | Status: DC

## 2020-09-05 MED ORDER — ORAL CARE MOUTH RINSE: 15.0000 mL | Freq: Once | OROMUCOSAL | Status: AC

## 2020-09-05 MED ORDER — TRANEXAMIC ACID-NACL 1000-0.7 MG/100ML-% IV SOLN
1000.0000 mg | INTRAVENOUS | Status: AC
  Administered 2020-09-05: 1000 mg via INTRAVENOUS
  Filled 2020-09-05: qty 100

## 2020-09-05 MED ORDER — PHENYLEPHRINE HCL-NACL 10-0.9 MG/250ML-% IV SOLN
INTRAVENOUS | Status: DC | PRN
  Administered 2020-09-05: 50 ug/min via INTRAVENOUS

## 2020-09-05 MED ORDER — OXYCODONE-ACETAMINOPHEN 5-325 MG PO TABS: 1.0000 | ORAL_TABLET | ORAL | 0 refills | Status: DC | PRN

## 2020-09-05 MED ORDER — 0.9 % SODIUM CHLORIDE (POUR BTL) OPTIME
TOPICAL | Status: DC | PRN
  Administered 2020-09-05: 1000 mL

## 2020-09-05 MED ORDER — DEXAMETHASONE SODIUM PHOSPHATE 10 MG/ML IJ SOLN
INTRAMUSCULAR | Status: DC | PRN
  Administered 2020-09-05: 4 mg via INTRAVENOUS

## 2020-09-05 SURGICAL SUPPLY — 64 items
BAG COUNTER SPONGE SURGICOUNT (BAG) IMPLANT
BAG ZIPLOCK 12X15 (MISCELLANEOUS) ×2 IMPLANT
BLADE SAW SGTL 83.5X18.5 (BLADE) ×2 IMPLANT
COOLER ICEMAN CLASSIC (MISCELLANEOUS) IMPLANT
COVER BACK TABLE 60X90IN (DRAPES) ×2 IMPLANT
COVER SURGICAL LIGHT HANDLE (MISCELLANEOUS) ×2 IMPLANT
CUP SUT UNIV REVERS 36 NEUTRAL (Cup) ×2 IMPLANT
DERMABOND ADVANCED (GAUZE/BANDAGES/DRESSINGS) ×1
DERMABOND ADVANCED .7 DNX12 (GAUZE/BANDAGES/DRESSINGS) ×1 IMPLANT
DRAPE INCISE IOBAN 66X45 STRL (DRAPES) IMPLANT
DRAPE ORTHO SPLIT 77X108 STRL (DRAPES) ×4
DRAPE SHEET LG 3/4 BI-LAMINATE (DRAPES) ×2 IMPLANT
DRAPE SURG 17X11 SM STRL (DRAPES) ×2 IMPLANT
DRAPE SURG ORHT 6 SPLT 77X108 (DRAPES) ×2 IMPLANT
DRAPE TOP 10253 STERILE (DRAPES) ×2 IMPLANT
DRAPE U-SHAPE 47X51 STRL (DRAPES) ×2 IMPLANT
DRESSING AQUACEL AG SP 3.5X6 (GAUZE/BANDAGES/DRESSINGS) ×1 IMPLANT
DRSG AQUACEL AG ADV 3.5X10 (GAUZE/BANDAGES/DRESSINGS) IMPLANT
DRSG AQUACEL AG SP 3.5X6 (GAUZE/BANDAGES/DRESSINGS) ×2
DURAPREP 26ML APPLICATOR (WOUND CARE) ×2 IMPLANT
ELECT BLADE TIP CTD 4 INCH (ELECTRODE) ×2 IMPLANT
ELECT REM PT RETURN 15FT ADLT (MISCELLANEOUS) ×2 IMPLANT
FACESHIELD WRAPAROUND (MASK) ×8 IMPLANT
GLENOID UNI REV MOD 24 +2 LAT (Joint) ×2 IMPLANT
GLENOSPHERE 36 +4 LAT/24 (Joint) ×2 IMPLANT
GLOVE SRG 8 PF TXTR STRL LF DI (GLOVE) ×1 IMPLANT
GLOVE SURG ENC MOIS LTX SZ7 (GLOVE) ×2 IMPLANT
GLOVE SURG ENC MOIS LTX SZ7.5 (GLOVE) ×2 IMPLANT
GLOVE SURG UNDER POLY LF SZ7 (GLOVE) ×2 IMPLANT
GLOVE SURG UNDER POLY LF SZ8 (GLOVE) ×2
GOWN STRL REUS W/TWL LRG LVL3 (GOWN DISPOSABLE) ×4 IMPLANT
INSERT HUMERAL 36 +6 (Shoulder) ×2 IMPLANT
KIT BASIN OR (CUSTOM PROCEDURE TRAY) ×2 IMPLANT
KIT TURNOVER KIT A (KITS) ×2 IMPLANT
MANIFOLD NEPTUNE II (INSTRUMENTS) ×2 IMPLANT
NEEDLE TAPERED W/ NITINOL LOOP (MISCELLANEOUS) ×2 IMPLANT
NS IRRIG 1000ML POUR BTL (IV SOLUTION) ×2 IMPLANT
PACK SHOULDER (CUSTOM PROCEDURE TRAY) ×2 IMPLANT
PAD ARMBOARD 7.5X6 YLW CONV (MISCELLANEOUS) ×2 IMPLANT
PAD COLD SHLDR WRAP-ON (PAD) IMPLANT
PIN NITINOL TARGETER 2.8 (PIN) IMPLANT
PIN SET MODULAR GLENOID SYSTEM (PIN) IMPLANT
RESTRAINT HEAD UNIVERSAL NS (MISCELLANEOUS) ×2 IMPLANT
SCREW CENTRAL MODULAR 25 (Screw) ×2 IMPLANT
SCREW PERI LOCK 5.5X24 (Screw) ×4 IMPLANT
SCREW PERIPHERAL 5.5X20 LOCK (Screw) ×2 IMPLANT
SCREW PERIPHERAL 5.5X28 LOCK (Screw) ×2 IMPLANT
SLING ARM FOAM STRAP LRG (SOFTGOODS) IMPLANT
SLING ARM FOAM STRAP MED (SOFTGOODS) IMPLANT
SPONGE T-LAP 18X18 ~~LOC~~+RFID (SPONGE) IMPLANT
STEM HUMERAL UNI REVERS SZ9 (Stem) ×2 IMPLANT
SUCTION FRAZIER HANDLE 12FR (TUBING) ×2
SUCTION TUBE FRAZIER 12FR DISP (TUBING) ×1 IMPLANT
SUT FIBERWIRE #2 38 T-5 BLUE (SUTURE)
SUT MNCRL AB 3-0 PS2 18 (SUTURE) ×2 IMPLANT
SUT MON AB 2-0 CT1 36 (SUTURE) ×2 IMPLANT
SUT VIC AB 1 CT1 36 (SUTURE) ×2 IMPLANT
SUTURE FIBERWR #2 38 T-5 BLUE (SUTURE) IMPLANT
SUTURE TAPE 1.3 40 TPR END (SUTURE) ×2 IMPLANT
SUTURETAPE 1.3 40 TPR END (SUTURE) ×4
TOWEL OR 17X26 10 PK STRL BLUE (TOWEL DISPOSABLE) ×2 IMPLANT
TOWEL OR NON WOVEN STRL DISP B (DISPOSABLE) ×2 IMPLANT
WATER STERILE IRR 1000ML POUR (IV SOLUTION) ×4 IMPLANT
YANKAUER SUCT BULB TIP 10FT TU (MISCELLANEOUS) ×2 IMPLANT

## 2020-09-05 NOTE — Progress Notes (Signed)
AssistedDr. Annye Asa with right ultrasound guided, interscalene  block. Side rails up, monitors on throughout procedure. See vital signs in flow sheet. Tolerated Procedure well.

## 2020-09-05 NOTE — Evaluation (Signed)
Occupational Therapy Evaluation Patient Details Name: Marcia Norris MRN: [id number] DOB: [birth year] Today's Date: 09/05/2020    History of Present Illness Patient s/p Right rTSA   Clinical Impression   Marcia Norris is a 75 year old woman s/p shoulder replacement without functional use of right dominant upper extremity secondary to effects of surgery and interscalene block and shoulder precautions. Therapist provided education and instruction to patient and spouse in regards to exercises, precautions, positioning, donning upper extremity clothing and bathing while maintaining shoulder precautions, ice and edema management using cooler and cuff and donning/doffing sling. Patient and spouse verbalized understanding and demonstrated as needed. Patient needed assistance to donn shirt, underwear, pants, and ling and provided with instruction on compensatory strategies to perform ADLs. Handouts provided to maximize retention of education. Patient to follow up with MD for further therapy needs.      Follow Up Recommendations  Follow surgeon's recommendation for DC plan and follow-up therapies    Equipment Recommendations  None recommended by OT    Recommendations for Other Services       Precautions / Restrictions Precautions Precautions: Shoulder Shoulder Interventions: Shoulder sling/immobilizer;At all times;Off for dressing/bathing/exercises Precaution Comments: If sitting in controlled environment, ok to come out of sling to give neck a break. Please sleep in it to protect until follow up in office.     OK to use operative arm for feeding, hygiene and ADLs.   Ok to instruct Pendulums and lap slides as exercises. Ok to use operative arm within the following parameters for ADL purposes     New ROM (8/18)   Ok for PROM, AAROM, AROM within pain tolerance and within the following ROM   ER 20   ABD 45   FE Required Braces or Orthoses: Sling Restrictions Weight Bearing Restrictions: Yes RUE  Weight Bearing: Non weight bearing      Mobility Bed Mobility                    Transfers                      Balance                                           ADL either performed or assessed with clinical judgement   ADL Overall ADL's : Needs assistance/impaired Eating/Feeding: Modified independent   Grooming: Modified independent   Upper Body Bathing: Minimal assistance;Set up   Lower Body Bathing: Minimal assistance   Upper Body Dressing : Moderate assistance   Lower Body Dressing: Minimal assistance   Toilet Transfer: Min guard   Toileting- Clothing Manipulation and Hygiene: Minimal assistance       Functional mobility during ADLs: Min guard       Vision Patient Visual Report: No change from baseline       Perception     Praxis      Pertinent Vitals/Pain Pain Assessment: No/denies pain     Hand Dominance     Extremity/Trunk Assessment Upper Extremity Assessment Upper Extremity Assessment: RUE deficits/detail RUE Deficits / Details: Impaired secondary to block RUE Sensation: decreased light touch RUE Coordination: decreased fine motor;decreased gross motor       Cervical / Trunk Assessment Cervical / Trunk Assessment: Normal   Communication     Cognition Arousal/Alertness: Awake/alert Behavior During Therapy: WFL for tasks assessed/performed Overall  Cognitive Status: Within Functional Limits for tasks assessed                                     General Comments       Exercises     Shoulder Instructions Shoulder Instructions Donning/doffing shirt without moving shoulder: Patient able to independently direct caregiver Method for sponge bathing under operated UE: Patient able to independently direct caregiver Donning/doffing sling/immobilizer: Patient able to independently direct caregiver Correct positioning of sling/immobilizer: Patient able to independently direct  caregiver Pendulum exercises (written home exercise program): Patient able to independently direct caregiver Sling wearing schedule (on at all times/off for ADL's): Independent Proper positioning of operated UE when showering: Patient able to independently direct caregiver Dressing change: Patient able to independently direct caregiver Positioning of UE while sleeping: Patient able to independently direct caregiver    Home Living Family/patient expects to be discharged to:: Private residence Living Arrangements: Spouse/significant other                                      Prior Functioning/Environment                   OT Problem List: Decreased strength;Decreased range of motion;Impaired UE functional use;Pain      OT Treatment/Interventions:      OT Goals(Current goals can be found in the care plan section) Acute Rehab OT Goals OT Goal Formulation: All assessment and education complete, DC therapy  OT Frequency:     Barriers to D/C:            Co-evaluation              AM-PAC OT "6 Clicks" Daily Activity     Outcome Measure Help from another person eating meals?: None Help from another person taking care of personal grooming?: None Help from another person toileting, which includes using toliet, bedpan, or urinal?: A Little Help from another person bathing (including washing, rinsing, drying)?: A Little Help from another person to put on and taking off regular upper body clothing?: A Lot Help from another person to put on and taking off regular lower body clothing?: A Lot 6 Click Score: 18   End of Session Nurse Communication:  (OT education complete)  Activity Tolerance: Patient tolerated treatment well Patient left: in chair;with family/visitor present  OT Visit Diagnosis: Pain                Time: 1326-1350 OT Time Calculation (min): 24 min Charges:  OT General Charges $OT Visit: 1 Visit OT Evaluation $OT Eval Low Complexity: 1  Low OT Treatments $Self Care/Home Management : 8-22 mins  Sisto Granillo, OTR/L [place]  Office [phone] Pager: [phone]   Lenward Chancellor 09/05/2020, 3:12 PM

## 2020-09-05 NOTE — Op Note (Signed)
09/05/2020  11:28 AM  PATIENT:   Marcia Norris  75 y.o. female  PRE-OPERATIVE DIAGNOSIS:  Right shoulder osteoarthritis, rotator cuff dysfunction  POST-OPERATIVE DIAGNOSIS: Same  PROCEDURE: Right shoulder reverse arthroplasty utilizing a press-fit size 9 Arthrex stem with a neutral metaphysis, +6 polyethylene insert, 36/+4 glenosphere on a small/+2 baseplate  SURGEON:  Aslan Himes, Metta Clines M.D.  ASSISTANTS: Jenetta Loges, PA-C  ANESTHESIA:   General endotracheal and interscalene block with Exparel  EBL: 150 cc  SPECIMEN: None  Drains: None   PATIENT DISPOSITION:  PACU - hemodynamically stable.    PLAN OF CARE: Discharge to home after PACU  Brief history:  Ms. Matus is a 75 year old female who has had chronic and progressively increasing right shoulder pain related to severe osteoarthritis and associated rotator cuff dysfunction.  Due to her increasing pain and functional mentation she is brought to the operating this time for planned right shoulder reverse arthroplasty.  Preoperatively, I counseled the patient regarding treatment options and risks versus benefits thereof.  Possible surgical complications were all reviewed including potential for bleeding, infection, neurovascular injury, persistent pain, loss of motion, anesthetic complication, failure of the implant, and possible need for additional surgery. They understand and accept and agrees with our planned procedure.   Procedure in detail:  After undergoing routine preop evaluation the patient received prophylactic antibiotics and interscalene block with Exparel was established in the holding area by the anesthesia department.  Patient was subsequently placed supine on the operating table and underwent the smooth induction of a general endotracheal anesthesia.  Subsequently placed in the beachchair position and appropriately padded and protected.  The right shoulder girdle region was sterilely prepped and draped in  standard fashion.  Timeout was called.  A deltopectoral approach was made in the right shoulder through an 8 cm incision.  Skin flaps were elevated dissection carried deeply and the deltopectoral interval was opened from proximal to distal with the vein taken laterally.  Upper centimeter the pectoralis major was tenotomized for exposure and the conjoined tendon was mobilized and retracted medially and adhesions were divided beneath the deltoid.  The long head biceps tendon was then tenodesed at the upper border the pectoralis major tendon with the proximal segment unroofed and excised.  The rotator cuff was then split from the apex of the bicipital groove to the base of the coracoid and the subscapularis was then separated from the lesser tuberosity using electrocautery and the free margin was then tagged with a pair of suture tape sutures.  Capsular attachments from the anterior and infra margins of the humeral neck were then divided in a subperiosteal fashion there was a very large inferior osteophyte that we carefully dissected free allowing deliver the humeral head to the wound.  Extra medullary guide was then used to outline the proposed humeral head resection which was performed with an oscillating saw at approximate 20 degrees retroversion.  Rondure was then used to remove the large osteophytes from the humeral neck.  A metal cap was then placed over the cut proximal humeral surface and the glenoid was then exposed with appropriate retractors.  A circumferential labral resection was then completed gaining complete visualization of the periphery of the glenoid.  A guidepin was then directed into the center of the glenoid and the glenoid was prepared with the central followed by the peripheral reamer to a stable subchondral bony bed.  Preparation completed with the central drill and tapped.  Our baseplate was then assembled with a 25 mm  lag screw and vancomycin powder applied to the threads of the lag screw and  it was then inserted with excellent purchase and fixation.  The peripheral locking screws were all then placed using standard technique with excellent fixation.  A 36/+4 glenosphere was then impacted onto the baseplate and the central locking screw was placed.  We then returned our attention to the proximal humerus where the canal was opened hand reaming to size 7 and ultimately broaching to a size 9.  A neutral metaphyseal reamer was then used to prepare the metaphysis and a trial implant was placed which showed good motion good stability and good soft tissue balance.  At this point the trial was then removed the final implant was assembled.  Vancomycin powder spread liberally into the humeral canal and a final implant was seated with excellent purchase and fixation.  Trial reductions at this point showed the best soft tissue balance with a +6 polyethylene insert.  Our trial was then removed and the final +6 polywas then impacted onto the implant and final reduction again showed good motion good stability good soft tissue balance.  The wound was copiously irrigated.  Final hemostasis was obtained.  The balance of the vancomycin powder was then spread liberally throughout the deep soft tissue layers.  Subscapularis was then repaired back to the eyelets on the collar of the implant after we confirmed good elasticity.  The deltopectoral interval was then repaired with a series of figure-of-eight #1 Vicryl sutures.  2-0 Monocryl used to close the subcu layer and intracuticular 3-0 Monocryl used to close the skin followed by Dermabond and Aquacel dressing.  The right arm is placed into a sling and the patient was awakened, extubated, and taken to recovery room in stable condition.  Jenetta Loges, PA-C was utilized as an Environmental consultant throughout this case, essential for help with positioning the patient, positioning extremity, tissue manipulation, implantation of the prosthesis, suture management, wound closure, and  intraoperative decision-making.  Marin Shutter MD   Contact # [phone]

## 2020-09-05 NOTE — Anesthesia Procedure Notes (Signed)
Anesthesia Regional Block: Interscalene brachial plexus block   Pre-Anesthetic Checklist: , timeout performed,  Correct Patient, Correct Site, Correct Laterality,  Correct Procedure, Correct Position, site marked,  Risks and benefits discussed,  Surgical consent,  Pre-op evaluation,  At surgeon's request and post-op pain management  Laterality: Right and Upper  Prep: chloraprep       Needles:  Injection technique: Single-shot  Needle Type: Echogenic Needle     Needle Length: 9cm  Needle Gauge: 21     Additional Needles:   Procedures:,,,, ultrasound used (permanent image in chart),,    Narrative:  Start time: 09/05/2020 10:18 AM End time: 09/05/2020 10:24 AM Injection made incrementally with aspirations every 5 mL.  Performed by: Personally  Anesthesiologist: Annye Asa, MD  Additional Notes: Pt identified in Holding room.  Monitors applied. Working IV access confirmed. Sterile prep R clavicle and neck.  #21ga ECHOgenic Arrow block needle to interscalene brachial plexus with US guidance.  10cc 0.5% Bupivacaine with 1:200k epi, exparel injected incrementally after negative test dose.  Patient asymptomatic, VSS, no heme aspirated, tolerated well.   Jenita Seashore, MD

## 2020-09-05 NOTE — H&P (Signed)
Marcia Norris    Chief Complaint: Right shoulder osteoarthritis, rotator cuff dysfunction HPI: The patient is a 75 y.o. female with chronic and progressively increasing right shoulder pain related to severe osteoarthritis.  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   Diabetes mellitus without complication (HCC)    borderline, diet controlled, no meds   Diverticulosis    Exertional shortness of breath    Heart murmur    Hyperglycemia    Hyperlipidemia    Hypertension    OSA on CPAP 02/16/2012   wears CPAP machine at night, but takes it off during the night; pt stated "I was told I have mild sleep apnea"   Seasonal allergies    Varicose veins    Vitamin D deficiency     Past Surgical History:  Procedure Laterality Date   ABDOMINAL HYSTERECTOMY  1990's   partial   ATRIAL FLUTTER ABLATION  01/27/2013   ATRIAL FLUTTER ABLATION N/A 01/27/2013   Procedure: ATRIAL FLUTTER ABLATION;  Surgeon: Evans Lance, MD;  Location: [facility] CATH LAB;  Service: Cardiovascular;  Laterality: N/A;   BACK SURGERY  04/2013   CATARACT EXTRACTION W/ INTRAOCULAR LENS  IMPLANT, BILATERAL Bilateral 10-12/2012   COLONOSCOPY     CYSTOSCOPY     "multiple after bladder tumor removed; released in 2013" (01/27/2013)   TRANSURETHRAL RESECTION OF BLADDER TUMOR  ~ 2004    Family History  Problem Relation Age of Onset   Cancer Mother    Allergies Mother    Varicose Veins Mother    Bladder Cancer Father    Heart disease Father    Kidney disease Father    Diabetes Father    Hyperlipidemia Father    Hypertension Father     Diabetes Sister    Colon cancer Neg Hx     Social History:  reports that she has never smoked. She has never used smokeless tobacco. She reports current alcohol use. She reports that she does not use drugs.   Medications Prior to Admission  Medication Sig Dispense Refill   ALPRAZolam (XANAX) 0.5 MG tablet Take 0.25 mg by mouth at bedtime as needed for sleep.     aspirin 81 MG tablet Take 81 mg by mouth every other day.     atorvastatin (LIPITOR) 40 MG tablet Take 40 mg by mouth every evening.     metFORMIN (GLUCOPHAGE-XR) 500 MG 24 hr tablet Take 500-1,000 mg by mouth See admin instructions. Take 500 mg in the morning and 1000 mg in the evening     Omega-3 Fatty Acids (FISH OIL PO) Take 1 capsule by mouth every 14 (fourteen) days.     valsartan (DIOVAN) 80 MG tablet Take 1 tablet (80 mg total) by mouth daily. (Patient taking differently: Take 80 mg by mouth every evening.) 90 tablet 3     Physical Exam: Right shoulder demonstrates profoundly restricted motion as noted at recent office visit.  Significant pain and global weakness.  Plain radiographs confirm complete obliteration of joint space with significant bony deformity, peripheral osteophyte formation, and subchondral sclerosis.  Vitals  Temp:  [97.5 F (36.4 C)] 97.5 F (36.4 C) (07/07 0744) Pulse Rate:  [83] 83 (07/07 0744) Resp:  [15] 15 (07/07 0744) BP: (152)/(70) 152/70 (07/07 0744) SpO2:  [97 %] 97 % (07/07 0744)  Assessment/Plan  Impression: Right shoulder osteoarthritis, rotator cuff dysfunction  Plan of Action: Procedure(s): REVERSE SHOULDER ARTHROPLASTY  Akaya Proffit M Hamsa Laurich 09/05/2020, 9:02 AM Contact # [phone]

## 2020-09-05 NOTE — Transfer of Care (Signed)
Immediate Anesthesia Transfer of Care Note  Patient: Marcia Norris  Procedure(s) Performed: REVERSE SHOULDER ARTHROPLASTY (Right: Shoulder)  Patient Location: PACU  Anesthesia Type:Regional and GA combined with regional for post-op pain  Level of Consciousness: awake, alert , oriented and patient cooperative  Airway & Oxygen Therapy: Patient Spontanous Breathing and Patient connected to face mask oxygen  Post-op Assessment: Report given to RN and Post -op Vital signs reviewed and stable  Post vital signs: Reviewed and stable  Last Vitals:  Vitals Value Taken Time  BP 155/86 09/05/20 1143  Temp    Pulse 95 09/05/20 1145  Resp 18 09/05/20 1145  SpO2 100 % 09/05/20 1145  Vitals shown include unvalidated device data.  Last Pain:  Vitals:   09/05/20 0940  PainSc: 0-No pain         Complications: No notable events documented.

## 2020-09-05 NOTE — Anesthesia Postprocedure Evaluation (Signed)
Anesthesia Post Note  Patient: Marcia Norris  Procedure(s) Performed: REVERSE SHOULDER ARTHROPLASTY (Right: Shoulder)     Patient location during evaluation: PACU Anesthesia Type: General Level of consciousness: awake and alert, patient cooperative and oriented Pain management: pain level controlled Vital Signs Assessment: post-procedure vital signs reviewed and stable Respiratory status: spontaneous breathing, nonlabored ventilation and respiratory function stable Cardiovascular status: blood pressure returned to baseline and stable Postop Assessment: no apparent nausea or vomiting Anesthetic complications: no   No notable events documented.  Last Vitals:  Vitals:   09/05/20 1226 09/05/20 1230  BP:  129/64  Pulse: 83 79  Resp: 18 18  Temp:  36.4 C  SpO2: 93% 95%    Last Pain:  Vitals:   09/05/20 1230  PainSc: 0-No pain                 Terel Bann,E. Jamilla Galli

## 2020-09-05 NOTE — Discharge Instructions (Signed)
 Kevin M. Supple, M.D., F.A.A.O.S. Orthopaedic Surgery Specializing in Arthroscopic and Reconstructive Surgery of the Shoulder [phone] [address] - [place], [place] [address] - Fax [phone]   POST-OP TOTAL SHOULDER REPLACEMENT INSTRUCTIONS  1. Follow up in the office for your first post-op appointment 10-14 days from the date of your surgery. If you do not already have a scheduled appointment, our office will contact you to schedule.  2. The bandage over your incision is waterproof. You may begin showering with this dressing on. You may leave this dressing on until first follow up appointment within 2 weeks. We prefer you leave this dressing in place until follow up however after 5-7 days if you are having itching or skin irritation and would like to remove it you may do so. Go slow and tug at the borders gently to break the bond the dressing has with the skin. At this point if there is no drainage it is okay to go without a bandage or you may cover it with a light guaze and tape. You can also expect significant bruising around your shoulder that will drift down your arm and into your chest wall. This is very normal and should resolve over several days.   3. Wear your sling/immobilizer at all times except to perform the exercises below or to occasionally let your arm dangle by your side to stretch your elbow. You also need to sleep in your sling immobilizer until instructed otherwise. It is ok to remove your sling if you are sitting in a controlled environment and allow your arm to rest in a position of comfort by your side or on your lap with pillows to give your neck and skin a break from the sling. You may remove it to allow arm to dangle by side to shower. If you are up walking around and when you go to sleep at night you need to wear it.  4. Range of motion to your elbow, wrist, and hand are encouraged 3-5 times daily. Exercise to your hand and fingers helps to reduce  swelling you may experience.   5. Prescriptions for a pain medication and a muscle relaxant are provided for you. It is recommended that if you are experiencing pain that you pain medication alone is not controlling, add the muscle relaxant along with the pain medication which can give additional pain relief. The first 1-2 days is generally the most severe of your pain and then should gradually decrease. As your pain lessens it is recommended that you decrease your use of the pain medications to an "as needed basis'" only and to always comply with the recommended dosages of the pain medications.  6. Pain medications can produce constipation along with their use. If you experience this, the use of an over the counter stool softener or laxative daily is recommended.   7. For additional questions or concerns, please do not hesitate to call the office. If after hours there is an answering service to forward your concerns to the physician on call.  8.Pain control following an exparel block  To help control your post-operative pain you received a nerve block  performed with Exparel which is a long acting anesthetic (numbing agent) which can provide pain relief and sensations of numbness (and relief of pain) in the operative shoulder and arm for up to 3 days. Sometimes it provides mixed relief, meaning you may still have numbness in certain areas of the arm but can still be able to   move  parts of that arm, hand, and fingers. We recommend that your prescribed pain medications  be used as needed. We do not feel it is necessary to "pre medicate" and "stay ahead" of pain.  Taking narcotic pain medications when you are not having any pain can lead to unnecessary and potentially dangerous side effects.    9. Use the ice machine as much as possible in the first 5-7 days from surgery, then you can wean its use to as needed. The ice typically needs to be replaced every 6 hours, instead of ice you can actually freeze  water bottles to put in the cooler and then fill water around them to avoid having to purchase ice. You can have spare water bottles freezing to allow you to rotate them once they have melted. Try to have a thin shirt or light cloth or towel under the ice wrap to protect your skin.   FOR ADDITIONAL INFO ON ICE MACHINE AND INSTRUCTIONS GO TO THE WEBSITE AT  https://www.[employer].com/products/donjoy/donjoy-iceman-[email or web address]  10.  We recommend that you avoid any dental work or cleaning in the first 3 months following your joint replacement. This is to help minimize the possibility of infection from the bacteria in your mouth that enters your bloodstream during dental work. We also recommend that you take an antibiotic prior to your dental work for the first year after your shoulder replacement to further help reduce that risk. Please simply contact our office for antibiotics to be sent to your pharmacy prior to dental work.  11. Dental Antibiotics:  In most cases prophylactic antibiotics for Dental procdeures after total joint surgery are not necessary.  Exceptions are as follows:  1. History of prior total joint infection  2. Severely immunocompromised (Organ Transplant, cancer chemotherapy, Rheumatoid biologic meds such as Humera)  3. Poorly controlled diabetes (A1C &gt; 8.0, blood glucose over 200)  If you have one of these conditions, contact your surgeon for an antibiotic prescription, prior to your dental procedure.   POST-OP EXERCISES  Pendulum Exercises  Perform pendulum exercises while standing and bending at the waist. Support your uninvolved arm on a table or chair and allow your operated arm to hang freely. Make sure to do these exercises passively - not using you shoulder muscles. These exercises can be performed once your nerve block effects have worn off.  Repeat 20 times. Do 3 sessions per day.     

## 2020-09-05 NOTE — Anesthesia Procedure Notes (Signed)
Procedure Name: Intubation Date/Time: 09/05/2020 10:04 AM Performed by: Cleda Daub, CRNA Pre-anesthesia Checklist: Patient identified, Emergency Drugs available, Suction available and Patient being monitored Patient Re-evaluated:Patient Re-evaluated prior to induction Oxygen Delivery Method: Circle system utilized Preoxygenation: Pre-oxygenation with 100% oxygen Induction Type: IV induction Ventilation: Mask ventilation without difficulty Laryngoscope Size: Mac and 3 Grade View: Grade I Tube type: Oral Tube size: 7.0 mm Number of attempts: 1 Airway Equipment and Method: Stylet and Oral airway Placement Confirmation: ETT inserted through vocal cords under direct vision, positive ETCO2 and breath sounds checked- equal and bilateral Secured at: 21 cm Tube secured with: Tape Dental Injury: Teeth and Oropharynx as per pre-operative assessment

## 2020-09-11 ENCOUNTER — Encounter (HOSPITAL_COMMUNITY): Payer: Self-pay | Admitting: Orthopedic Surgery

## 2020-09-26 DIAGNOSIS — L570 Actinic keratosis: Secondary | ICD-10-CM | POA: Insufficient documentation

## 2020-10-30 DIAGNOSIS — G5601 Carpal tunnel syndrome, right upper limb: Secondary | ICD-10-CM | POA: Insufficient documentation

## 2020-11-08 DIAGNOSIS — M659 Synovitis and tenosynovitis, unspecified: Secondary | ICD-10-CM | POA: Insufficient documentation

## 2020-11-08 DIAGNOSIS — M65941 Unspecified synovitis and tenosynovitis, right hand: Secondary | ICD-10-CM | POA: Insufficient documentation

## 2021-02-27 ENCOUNTER — Telehealth: Payer: Self-pay | Admitting: Gastroenterology

## 2021-02-27 NOTE — Telephone Encounter (Signed)
Good Morning Dr. Candis Schatz,   Former Dr. Deatra Ina patient called and was asking when her next colonoscopy is due. I advised her that she is due in 2025. Patient stated that she come with her husband to his OV with Dr. Havery Moros and he stated after 39 they normally stop doing the procedure.   Patient is seeking advice if she needs to go ahead and get it done, or if she will be okay to wait until 2025. Last colon was in 2015. Please advise. Thanks.

## 2021-07-14 NOTE — Progress Notes (Signed)
Cardiology Office Note:    Date:  07/22/2021   ID:  Marcia Norris, DOB 1945/06/19, MRN [id number]  PCP:  Vicenta Aly, [place] [facility] Cardiologist:  Ena Dawley, MD {  Referring MD: Vicenta Aly, FNP    History of Present Illness:    Marcia Norris is a 76 y.o. female with a hx of SVT status post AVRT/AVNRT ablation, chronic left bundle branch block, hypertension, hyperlipidemia, mild carotid artery stenosis, with history of OSA who was previously followed by Dr. [name] who now presents to clinic for follow-up.  Saw Marcia Hines, NP on 05/17/20 for preoperative clearance prior to shoulder arthroplasty on 05/30/20. She was doing well from CV standpoint. TTE obtained 06/13/20 showed LVEF 60-65%, mild aortic sclerosis, no significant valve disease  Was last seen in clinic on 06/2020 where she was doing well from CV standpoint.  Today, the patient overall feels well. She has been dealing with a lot of health issues with her husband, but states she is doing well. She is active with no chest pain, SOB, lightheadedness, or dizziness. Has LE edema by the end of the day which resolves by the morning. Blood pressure is mainly 120s at home. LDL 86. A1C 7.4. Tolerating medications without issues.   Past Medical History:  Diagnosis Date   Arthritis    Asthma    age 19   Atrial flutter ([place])    Cancer ([place]) 2021   skin. lt shoulder   Diabetes mellitus without complication (HCC)    borderline, diet controlled, no meds   Diverticulosis    Exertional shortness of breath    Heart murmur    Hyperglycemia    Hyperlipidemia    Hypertension    OSA on CPAP 02/16/2012   wears CPAP machine at night, but takes it off during the night; pt stated "I was told I have mild sleep apnea"   Seasonal allergies    Varicose veins    Vitamin D deficiency     Past Surgical History:  Procedure Laterality Date   ABDOMINAL HYSTERECTOMY  1990's   partial   ATRIAL FLUTTER  ABLATION  01/27/2013   ATRIAL FLUTTER ABLATION N/A 01/27/2013   Procedure: ATRIAL FLUTTER ABLATION;  Surgeon: Evans Lance, MD;  Location: [facility] CATH LAB;  Service: Cardiovascular;  Laterality: N/A;   BACK SURGERY  04/2013   CATARACT EXTRACTION W/ INTRAOCULAR LENS  IMPLANT, BILATERAL Bilateral 10-12/2012   COLONOSCOPY     CYSTOSCOPY     "multiple after bladder tumor removed; released in 2013" (01/27/2013)   REVERSE SHOULDER ARTHROPLASTY Right 09/05/2020   Procedure: REVERSE SHOULDER ARTHROPLASTY;  Surgeon: Justice Britain, MD;  Location: [facility];  Service: Orthopedics;  Laterality: Right;   TRANSURETHRAL RESECTION OF BLADDER TUMOR  ~ 2004    Current Medications: Current Meds  Medication Sig   ALPRAZolam (XANAX) 0.5 MG tablet Take 0.25 mg by mouth at bedtime as needed for sleep.   aspirin 81 MG tablet Take 81 mg by mouth every other day.   cyclobenzaprine (FLEXERIL) 10 MG tablet Take 1 tablet (10 mg total) by mouth 3 (three) times daily as needed for muscle spasms.   metFORMIN (GLUCOPHAGE-XR) 500 MG 24 hr tablet Take 500-1,000 mg by mouth See admin instructions. Take 500 mg in the morning and 1000 mg in the evening   Omega-3 Fatty Acids (FISH OIL PO) Take 1 capsule by mouth every 14 (fourteen) days.   [DISCONTINUED] atorvastatin (LIPITOR) 40 MG tablet Take 40 mg by  mouth every evening.   [DISCONTINUED] valsartan (DIOVAN) 80 MG tablet Take 1 tablet (80 mg total) by mouth daily.     Allergies:   Latex and Codeine   Social History   Socioeconomic History   Marital status: Married    Spouse name: Not on file   Number of children: 2   Years of education: 13   Highest education level: Not on file  Occupational History   Occupation: retired  Tobacco Use   Smoking status: Never   Smokeless tobacco: Never  Vaping Use   Vaping Use: Never used  Substance and Sexual Activity   Alcohol use: Yes    Alcohol/week: 0.0 standard drinks    Comment: Occasional glass of wine   Drug use: No    Sexual activity: Yes  Other Topics Concern   Not on file  Social History Narrative   Lives at home with her husband.   Right-handed.   1 cups caffeine/day.      Social Determinants of Health   Financial Resource Strain: Not on file  Food Insecurity: Not on file  Transportation Needs: Not on file  Physical Activity: Not on file  Stress: Not on file  Social Connections: Not on file     Family History: The patient's family history includes Allergies in her mother; Bladder Cancer in her father; Cancer in her mother; Diabetes in her father and sister; Heart disease in her father; Hyperlipidemia in her father; Hypertension in her father; Kidney disease in her father; Varicose Veins in her mother. There is no history of Colon cancer.  ROS:   Review of Systems  Constitutional:  Negative for chills and fever.  Respiratory:  Negative for cough and shortness of breath.   Cardiovascular:  Positive for leg swelling (mild). Negative for chest pain and palpitations.  Gastrointestinal:  Negative for blood in stool and vomiting.  Genitourinary:  Negative for hematuria.  Musculoskeletal:  Negative for back pain and falls.  Neurological:  Negative for dizziness, tingling and headaches.    EKGs/Labs/Other Studies Reviewed:    The following studies were reviewed today: TTE 18-Jun-2020: IMPRESSIONS   1. Left ventricular ejection fraction, by estimation, is 60 to 65%. The  left ventricle has normal function. The left ventricle has no regional  wall motion abnormalities. There is mild concentric left ventricular  hypertrophy most notably in the  basal-septal segment. Left ventricular diastolic parameters are consistent  with Grade I diastolic dysfunction (impaired relaxation).   2. Right ventricular systolic function is normal. The right ventricular  size is normal. There is normal pulmonary artery systolic pressure.   3. Left atrial size was mildly dilated.   4. The mitral valve is normal in  structure. Trivial mitral valve  regurgitation.   5. The aortic valve is tricuspid. There is mild thickening of the aortic  valve. Aortic valve regurgitation is not visualized. Mild aortic valve  sclerosis is present, with no evidence of aortic valve stenosis.   6. The inferior vena cava is normal in size with greater than 50%  respiratory variability, suggesting right atrial pressure of 3 mmHg.   Comparison(s): Compared to prior echo in 2018, there is no significant  change.   Echocardiogram 08/04/2016  Left ventricle: The cavity size was normal. There was mild focal    basal hypertrophy of the septum. Systolic function was normal.    The estimated ejection fraction was in the range of 55% to 60%.    Wall motion was normal; there were no  regional wall motion    abnormalities. Doppler parameters are consistent with abnormal    left ventricular relaxation (grade 1 diastolic dysfunction).  - Ventricular septum: Septal motion showed paradox. These changes    are consistent with a left bundle branch block.  - Mitral valve: There was mild regurgitation directed centrally.       EKG:   NSR with LBBB, HR 83   Recent Labs: No results found for requested labs within last 8760 hours.  Recent Lipid Panel No results found for: CHOL, TRIG, HDL, CHOLHDL, VLDL, LDLCALC, LDLDIRECT     Physical Exam:    VS:  BP 136/76   Pulse 83   Ht '5\' 6"'$  (1.676 m)   Wt 170 lb 6.4 oz (77.3 kg)   SpO2 97%   BMI 27.50 kg/m     Wt Readings from Last 3 Encounters:  07/22/21 170 lb 6.4 oz (77.3 kg)  08/27/20 168 lb (76.2 kg)  07/17/20 169 lb 12.8 oz (77 kg)     GEN: Well nourished, well developed in no acute distress HEENT: Normal NECK: No JVD; No carotid bruits CARDIAC: RRR, 2/6 systolic murmur, no rubs, gallops RESPIRATORY:  Clear to auscultation without rales, wheezing or rhonchi  ABDOMEN: Soft, non-tender, non-distended MUSCULOSKELETAL:  Trace edema, warm SKIN: Warm and dry NEUROLOGIC:   Alert and oriented x 3 PSYCHIATRIC:  Normal affect   ASSESSMENT:    1. SVT (supraventricular tachycardia) ([place])   2. Essential hypertension   3. Stenosis of left carotid artery   4. Hyperlipidemia, unspecified hyperlipidemia type   5. Aortic ejection murmur   6. LBBB (left bundle branch block)     PLAN:    In order of problems listed above:  #SVT s/p ablation: Doing well with no recurrence of palpitations. -Continue to monitor; no palpitations  #HTN: Well controlled at home running mainly 120s/80s. No orthostatic symptoms. -Continue valsartan '80mg'$  daily  #Chronic LBBB: Stable with normal EF on TTE in 2022.  -Continue serial monitoring of TTEs  #HLD: Followed by PCP last LDL 86.  -Continue lipitor '40mg'$  daily  #Mild Left Carotid Artery Stenosis: 1-39% on ultrasound on 05/23/20. -Stable; continue lipitor '40mg'$  as above  #Systolic Murmur: TTE with normal LVEF, mild aortic valve sclerosis without significant stenosis. No other valve disease. -TTE reassuring with no significant valve disease  #DMII: Managed by PCP. -Continue metformin 500/'1000mg'$     Medication Adjustments/Labs and Tests Ordered: Current medicines are reviewed at length with the patient today.  Concerns regarding medicines are outlined above.  Orders Placed This Encounter  Procedures   EKG 12-Lead   Meds ordered this encounter  Medications   valsartan (DIOVAN) 80 MG tablet    Sig: Take 1 tablet (80 mg total) by mouth daily.    Dispense:  90 tablet    Refill:  4   atorvastatin (LIPITOR) 40 MG tablet    Sig: Take 1 tablet (40 mg total) by mouth every evening.    Dispense:  90 tablet    Refill:  4    Patient Instructions  Medication Instructions:   Your physician recommends that you continue on your current medications as directed. Please refer to the Current Medication list given to you today.  *If you need a refill on your cardiac medications before your next appointment, please call your  pharmacy*    Follow-Up: At [facility], you and your health needs are our priority.  As part of our continuing mission to provide you with exceptional heart care, we  have created designated Provider Care Teams.  These Care Teams include your primary Cardiologist (physician) and Advanced Practice Providers (APPs -  Physician Assistants and Nurse Practitioners) who all work together to provide you with the care you need, when you need it.  We recommend signing up for the patient portal called "MyChart".  Sign up information is provided on this After Visit Summary.  MyChart is used to connect with patients for Virtual Visits (Telemedicine).  Patients are able to view lab/test results, encounter notes, upcoming appointments, etc.  Non-urgent messages can be sent to your provider as well.   To learn more about what you can do with MyChart, go to [email or web address].    Your next appointment:   1 year(s)  The format for your next appointment:   In Person  Provider:   Dr. Johney Frame   Important Information About Sugar           Signed, Freada Bergeron, MD  07/22/2021 9:47 AM    [place]

## 2021-07-22 ENCOUNTER — Ambulatory Visit (INDEPENDENT_AMBULATORY_CARE_PROVIDER_SITE_OTHER): Payer: Medicare Other | Admitting: Cardiology

## 2021-07-22 ENCOUNTER — Encounter: Payer: Self-pay | Admitting: Cardiology

## 2021-07-22 VITALS — BP 136/76 | HR 83 | Ht 66.0 in | Wt 170.4 lb

## 2021-07-22 DIAGNOSIS — I10 Essential (primary) hypertension: Secondary | ICD-10-CM | POA: Diagnosis not present

## 2021-07-22 DIAGNOSIS — E785 Hyperlipidemia, unspecified: Secondary | ICD-10-CM | POA: Diagnosis not present

## 2021-07-22 DIAGNOSIS — I6522 Occlusion and stenosis of left carotid artery: Secondary | ICD-10-CM | POA: Diagnosis not present

## 2021-07-22 DIAGNOSIS — I471 Supraventricular tachycardia: Secondary | ICD-10-CM | POA: Diagnosis not present

## 2021-07-22 DIAGNOSIS — I447 Left bundle-branch block, unspecified: Secondary | ICD-10-CM

## 2021-07-22 DIAGNOSIS — I351 Nonrheumatic aortic (valve) insufficiency: Secondary | ICD-10-CM

## 2021-07-22 MED ORDER — VALSARTAN 80 MG PO TABS: 80.0000 mg | ORAL_TABLET | Freq: Every day | ORAL | 4 refills | Status: DC

## 2021-07-22 MED ORDER — ATORVASTATIN CALCIUM 40 MG PO TABS: 40.0000 mg | ORAL_TABLET | Freq: Every evening | ORAL | 4 refills | Status: DC

## 2021-07-22 NOTE — Patient Instructions (Signed)
Medication Instructions:   Your physician recommends that you continue on your current medications as directed. Please refer to the Current Medication list given to you today.  *If you need a refill on your cardiac medications before your next appointment, please call your pharmacy*   Follow-Up: At [facility], you and your health needs are our priority.  As part of our continuing mission to provide you with exceptional heart care, we have created designated Provider Care Teams.  These Care Teams include your primary Cardiologist (physician) and Advanced Practice Providers (APPs -  Physician Assistants and Nurse Practitioners) who all work together to provide you with the care you need, when you need it.  We recommend signing up for the patient portal called "MyChart".  Sign up information is provided on this After Visit Summary.  MyChart is used to connect with patients for Virtual Visits (Telemedicine).  Patients are able to view lab/test results, encounter notes, upcoming appointments, etc.  Non-urgent messages can be sent to your provider as well.   To learn more about what you can do with MyChart, go to [email or web address].    Your next appointment:   1 year(s)  The format for your next appointment:   In Person  Provider:   Dr. Pemberton   Important Information About Sugar       

## 2021-10-03 DIAGNOSIS — D099 Carcinoma in situ, unspecified: Secondary | ICD-10-CM | POA: Insufficient documentation

## 2022-01-05 DIAGNOSIS — M65332 Trigger finger, left middle finger: Secondary | ICD-10-CM | POA: Insufficient documentation

## 2022-04-27 ENCOUNTER — Encounter: Payer: Self-pay | Admitting: Cardiology

## 2022-05-28 DIAGNOSIS — G8918 Other acute postprocedural pain: Secondary | ICD-10-CM | POA: Insufficient documentation

## 2022-07-07 LAB — HEMOGLOBIN A1C: Hemoglobin A1C: 6.7

## 2022-08-07 NOTE — Progress Notes (Unsigned)
Cardiology Office Note:    Date:  08/07/2022   ID:  Marcia Norris, DOB 1946/01/26, MRN [id number]  PCP:  Elizabeth Palau, FNP   [facility] Providers Cardiologist:  Tobias Alexander, MD {  Referring MD: Elizabeth Palau, FNP    History of Present Illness:    Marcia Norris is a 77 y.o. female with a hx of SVT status post AVRT/AVNRT ablation, chronic left bundle branch block, hypertension, hyperlipidemia, mild carotid artery stenosis, with history of OSA who was previously followed by Dr. Delton See who now presents to clinic for follow-up.  Saw Shonna Chock, NP on 05/17/20 for preoperative clearance prior to shoulder arthroplasty on 05/30/20. She was doing well from CV standpoint. TTE obtained 06/13/20 showed LVEF 60-65%, mild aortic sclerosis, no significant valve disease  Was last seen in clinic on 06/2021 where she was doing well from CV standpoint.  Today, ***  Past Medical History:  Diagnosis Date   Arthritis    Asthma    age 30   Atrial flutter (H[facility])    Cancer ([facility]) 2021   skin. lt shoulder   Diabetes mellitus without complication (HCC)    borderline, diet controlled, no meds   Diverticulosis    Exertional shortness of breath    Heart murmur    Hyperglycemia    Hyperlipidemia    Hypertension    OSA on CPAP 02/16/2012   wears CPAP machine at night, but takes it off during the night; pt stated "I was told I have mild sleep apnea"   Seasonal allergies    Varicose veins    Vitamin D deficiency     Past Surgical History:  Procedure Laterality Date   ABDOMINAL HYSTERECTOMY  1990's   partial   ATRIAL FLUTTER ABLATION  01/27/2013   ATRIAL FLUTTER ABLATION N/A 01/27/2013   Procedure: ATRIAL FLUTTER ABLATION;  Surgeon: Marinus Maw, MD;  Location: [facility] CATH LAB;  Service: Cardiovascular;  Laterality: N/A;   BACK SURGERY  04/2013   CATARACT EXTRACTION W/ INTRAOCULAR LENS  IMPLANT, BILATERAL Bilateral 10-12/2012   COLONOSCOPY     CYSTOSCOPY     "multiple after  bladder tumor removed; released in 2013" (01/27/2013)   REVERSE SHOULDER ARTHROPLASTY Right 09/05/2020   Procedure: REVERSE SHOULDER ARTHROPLASTY;  Surgeon: Francena Hanly, MD;  Location: [facility];  Service: Orthopedics;  Laterality: Right;   TRANSURETHRAL RESECTION OF BLADDER TUMOR  ~ 2004    Current Medications: No outpatient medications have been marked as taking for the 08/12/22 encounter (Appointment) with Meriam Sprague, MD.     Allergies:   Latex and Codeine   Social History   Socioeconomic History   Marital status: Married    Spouse name: Not on file   Number of children: 2   Years of education: 13   Highest education level: Not on file  Occupational History   Occupation: retired  Tobacco Use   Smoking status: Never   Smokeless tobacco: Never  Vaping Use   Vaping Use: Never used  Substance and Sexual Activity   Alcohol use: Yes    Alcohol/week: 0.0 standard drinks of alcohol    Comment: Occasional glass of wine   Drug use: No   Sexual activity: Yes  Other Topics Concern   Not on file  Social History Narrative   Lives at home with her husband.   Right-handed.   1 cups caffeine/day.      Social Determinants of Health   Financial Resource Strain: Not on file  Food  Insecurity: Not on file  Transportation Needs: Not on file  Physical Activity: Not on file  Stress: Not on file  Social Connections: Not on file     Family History: The patient's family history includes Allergies in her mother; Bladder Cancer in her father; Cancer in her mother; Diabetes in her father and sister; Heart disease in her father; Hyperlipidemia in her father; Hypertension in her father; Kidney disease in her father; Varicose Veins in her mother. There is no history of Colon cancer.  ROS:   Review of Systems  Constitutional:  Negative for chills and fever.  Respiratory:  Negative for cough and shortness of breath.   Cardiovascular:  Positive for leg swelling (mild). Negative for  chest pain and palpitations.  Gastrointestinal:  Negative for blood in stool and vomiting.  Genitourinary:  Negative for hematuria.  Musculoskeletal:  Negative for back pain and falls.  Neurological:  Negative for dizziness, tingling and headaches.     EKGs/Labs/Other Studies Reviewed:    The following studies were reviewed today: TTE 07-11-2020: IMPRESSIONS   1. Left ventricular ejection fraction, by estimation, is 60 to 65%. The  left ventricle has normal function. The left ventricle has no regional  wall motion abnormalities. There is mild concentric left ventricular  hypertrophy most notably in the  basal-septal segment. Left ventricular diastolic parameters are consistent  with Grade I diastolic dysfunction (impaired relaxation).   2. Right ventricular systolic function is normal. The right ventricular  size is normal. There is normal pulmonary artery systolic pressure.   3. Left atrial size was mildly dilated.   4. The mitral valve is normal in structure. Trivial mitral valve  regurgitation.   5. The aortic valve is tricuspid. There is mild thickening of the aortic  valve. Aortic valve regurgitation is not visualized. Mild aortic valve  sclerosis is present, with no evidence of aortic valve stenosis.   6. The inferior vena cava is normal in size with greater than 50%  respiratory variability, suggesting right atrial pressure of 3 mmHg.   Comparison(s): Compared to prior echo in 2018, there is no significant  change.   Echocardiogram 08/04/2016  Left ventricle: The cavity size was normal. There was mild focal    basal hypertrophy of the septum. Systolic function was normal.    The estimated ejection fraction was in the range of 55% to 60%.    Wall motion was normal; there were no regional wall motion    abnormalities. Doppler parameters are consistent with abnormal    left ventricular relaxation (grade 1 diastolic dysfunction).  - Ventricular septum: Septal motion showed  paradox. These changes    are consistent with a left bundle branch block.  - Mitral valve: There was mild regurgitation directed centrally.       EKG:   ***  Recent Labs: No results found for requested labs within last 365 days.  Recent Lipid Panel No results found for: "CHOL", "TRIG", "HDL", "CHOLHDL", "VLDL", "LDLCALC", "LDLDIRECT"     Physical Exam:    VS:  There were no vitals taken for this visit.    Wt Readings from Last 3 Encounters:  07/22/21 170 lb 6.4 oz (77.3 kg)  08/27/20 168 lb (76.2 kg)  07/17/20 169 lb 12.8 oz (77 kg)     GEN: Well nourished, well developed in no acute distress HEENT: Normal NECK: No JVD; No carotid bruits CARDIAC: RRR, 2/6 systolic murmur, no rubs, gallops RESPIRATORY:  Clear to auscultation without rales, wheezing or rhonchi  ABDOMEN: Soft, non-tender, non-distended MUSCULOSKELETAL:  Trace edema, warm SKIN: Warm and dry NEUROLOGIC:  Alert and oriented x 3 PSYCHIATRIC:  Normal affect   ASSESSMENT:    No diagnosis found.   PLAN:    In order of problems listed above:  #SVT s/p ablation: Doing well with no recurrence of palpitations. -Continue to monitor; no palpitations  #HTN: Well controlled at home running mainly 120s/80s. No orthostatic symptoms. -Continue valsartan 80mg  daily  #Chronic LBBB: Stable with normal EF on TTE in 2022.  -Continue serial monitoring of TTEs  #HLD: Followed by PCP last LDL 86.  -Continue lipitor 40mg  daily  #Mild Left Carotid Artery Stenosis: 1-39% on ultrasound on 05/23/20. -Stable; continue lipitor 40mg  as above  #Systolic Murmur: TTE with normal LVEF, mild aortic valve sclerosis without significant stenosis. No other valve disease. -TTE reassuring with no significant valve disease  #DMII: Managed by PCP. -Continue metformin 500/1000mg     Medication Adjustments/Labs and Tests Ordered: Current medicines are reviewed at length with the patient today.  Concerns regarding medicines  are outlined above.  No orders of the defined types were placed in this encounter.  No orders of the defined types were placed in this encounter.   There are no Patient Instructions on file for this visit.      Signed, Meriam Sprague, MD  08/07/2022 2:31 PM    [facility]

## 2022-08-10 ENCOUNTER — Ambulatory Visit (INDEPENDENT_AMBULATORY_CARE_PROVIDER_SITE_OTHER): Payer: Medicare Other | Admitting: Family Medicine

## 2022-08-10 VITALS — BP 110/62 | HR 81 | Temp 98.0°F | Ht 66.0 in | Wt 162.2 lb

## 2022-08-10 DIAGNOSIS — R300 Dysuria: Secondary | ICD-10-CM | POA: Diagnosis not present

## 2022-08-10 LAB — POCT URINALYSIS DIPSTICK
Bilirubin, UA: NEGATIVE
Glucose, UA: POSITIVE — AB
Ketones, UA: NEGATIVE
Leukocytes, UA: NEGATIVE
Nitrite, UA: NEGATIVE
Protein, UA: NEGATIVE
Spec Grav, UA: 1.015 (ref 1.010–1.025)
Urobilinogen, UA: 0.2 E.U./dL
pH, UA: 6 (ref 5.0–8.0)

## 2022-08-10 NOTE — Progress Notes (Signed)
Acute Office Visit   Subjective:  Patient ID: Marcia Norris, female    DOB: 04/21/1945, 77 y.o.   MRN: [id number]  Chief Complaint  Patient presents with   Urinary Frequency    Pt is here today with C/O burning w/urination, nausea,discomfort. Pt reports sx started last week- (Thursday?)    HPI Patient is a 77 year old caucasian female that presents with burning dysuria, nausea, and discomfort. Increase in urinary urgency than usual. Denies abd pain, flank pain, or lower back pain.   Denies fever, vomiting or hematuria.   Symptoms started on Thursday. Intermittent, but becoming more noticeable.   She reports she has a history of kidney stones and been seen by [facility].   ROS See HPI above      Objective:   BP 110/62   Pulse 81   Temp 98 F (36.7 C)   Ht 5\' 6"  (1.676 m)   Wt 162 lb 4 oz (73.6 kg)   SpO2 95%   BMI 26.19 kg/m    Physical Exam Vitals reviewed.  Constitutional:      General: She is not in acute distress.    Appearance: Normal appearance. She is not ill-appearing, toxic-appearing or diaphoretic.  HENT:     Head: Normocephalic and atraumatic.  Eyes:     General:        Right eye: No discharge.        Left eye: No discharge.     Conjunctiva/sclera: Conjunctivae normal.  Cardiovascular:     Rate and Rhythm: Normal rate and regular rhythm.     Heart sounds: Normal heart sounds. No murmur heard.    No friction rub. No gallop.  Pulmonary:     Effort: Pulmonary effort is normal. No respiratory distress.     Breath sounds: Normal breath sounds.  Abdominal:     General: Bowel sounds are normal. There is no distension.     Palpations: Abdomen is soft. There is no mass.     Tenderness: There is no abdominal tenderness. There is no right CVA tenderness or left CVA tenderness.  Musculoskeletal:        General: Normal range of motion.  Skin:    General: Skin is warm and dry.  Neurological:     General: No focal deficit present.     Mental  Status: She is alert and oriented to person, place, and time. Mental status is at baseline.  Psychiatric:        Mood and Affect: Mood normal.        Behavior: Behavior normal.        Thought Content: Thought content normal.        Judgment: Judgment normal.    Results for orders placed or performed in visit on 08/10/22  POCT urinalysis dipstick  Result Value Ref Range   Color, UA     Clarity, UA     Glucose, UA Positive (A) Negative   Bilirubin, UA NEG    Ketones, UA NEG    Spec Grav, UA 1.015 1.010 - 1.025   Blood, UA 2+    pH, UA 6.0 5.0 - 8.0   Protein, UA Negative Negative   Urobilinogen, UA 0.2 0.2 or 1.0 E.U./dL   Nitrite, UA NEG    Leukocytes, UA Negative Negative   Appearance     Odor    Results for orders placed or performed in visit on 08/10/22  Hemoglobin A1c  Result Value Ref Range   Hemoglobin  A1C 6.7       Assessment & Plan:  Dysuria -     POCT urinalysis dipstick -     Urine Culture  -Urinalysis shows glucose in the urine, but she takes Jardiance which would be a reason for this. Also, seen blood in urine with no signs of urinary tract infection. Will send urine for culture. Office will call with results and she  may see them on MyChart. If culture comes back with enough bacteria, will send in antibiotic.  -In the meantime, advised to please call [facility] to see about making an appointment for the blood in her urine. Advised if she needs a referral, please call back to the office or send a message through MyChart. Will send a referral if needed. -If symptoms become worse, please follow up with this office or your primary care provider.   Zandra Abts, NP

## 2022-08-10 NOTE — Patient Instructions (Signed)
It was a pleasure to meet you today and care for you. -Urinalysis shows glucose in the urine, but you take Jardiance which would be a reason for this. Also, seen blood in urine with no signs of urinary tract infection. Will send urine for culture. Office will call with results and you may see them on MyChart. If culture comes back with enough bacteria, will send in antibiotic.  -In the meantime, please call [facility] to see about making an appointment for the blood in your urine. If you need a referral, please call back to the office or send a message through MyChart. Will send a referral if needed. -If symptoms become worse, please follow up with this office or your primary care provider.

## 2022-08-11 ENCOUNTER — Telehealth: Payer: Self-pay

## 2022-08-11 ENCOUNTER — Other Ambulatory Visit: Payer: Self-pay

## 2022-08-11 DIAGNOSIS — R300 Dysuria: Secondary | ICD-10-CM

## 2022-08-11 LAB — UNLABELED: Test Ordered On Req: 395

## 2022-08-11 NOTE — Telephone Encounter (Signed)
Missing urine lable

## 2022-08-11 NOTE — Telephone Encounter (Signed)
Called [facility] to inquire about the unlabeled specimen for the urine culture. I filled out the unlabeled specimen form as instructed and faxed it to [facility]. JoAnna's CMA was informed

## 2022-08-11 NOTE — Telephone Encounter (Signed)
Marcia Norris has sent missing label form to [facility]. Pt will not have to come in .

## 2022-08-12 ENCOUNTER — Telehealth: Payer: Self-pay

## 2022-08-12 ENCOUNTER — Ambulatory Visit: Payer: Medicare Other | Attending: Cardiology | Admitting: Cardiology

## 2022-08-12 ENCOUNTER — Encounter: Payer: Self-pay | Admitting: Cardiology

## 2022-08-12 VITALS — BP 124/72 | HR 75 | Ht 62.0 in | Wt 163.2 lb

## 2022-08-12 DIAGNOSIS — I10 Essential (primary) hypertension: Secondary | ICD-10-CM

## 2022-08-12 DIAGNOSIS — I447 Left bundle-branch block, unspecified: Secondary | ICD-10-CM | POA: Diagnosis not present

## 2022-08-12 DIAGNOSIS — E785 Hyperlipidemia, unspecified: Secondary | ICD-10-CM

## 2022-08-12 DIAGNOSIS — I6522 Occlusion and stenosis of left carotid artery: Secondary | ICD-10-CM | POA: Diagnosis not present

## 2022-08-12 DIAGNOSIS — Z7984 Long term (current) use of oral hypoglycemic drugs: Secondary | ICD-10-CM

## 2022-08-12 DIAGNOSIS — I471 Supraventricular tachycardia, unspecified: Secondary | ICD-10-CM | POA: Diagnosis not present

## 2022-08-12 DIAGNOSIS — E119 Type 2 diabetes mellitus without complications: Secondary | ICD-10-CM

## 2022-08-12 NOTE — Telephone Encounter (Signed)
Marcia Norris said yesterday she had to sign a form for [facility] so they run sample

## 2022-08-12 NOTE — Telephone Encounter (Signed)
-----   Message from Alveria Apley, NP sent at 08/12/2022  2:38 PM EDT ----- Can we please check to see [facility] is processing the urine culture?

## 2022-08-12 NOTE — Telephone Encounter (Signed)
-----   Message from Alveria Apley, NP sent at 08/11/2022  4:13 PM EDT ----- Is this sample now being completed by [facility]?

## 2022-08-12 NOTE — Patient Instructions (Signed)
Medication Instructions:   Your physician recommends that you continue on your current medications as directed. Please refer to the Current Medication list given to you today.  *If you need a refill on your cardiac medications before your next appointment, please call your pharmacy*    Testing/Procedures:  Your physician has requested that you have an echocardiogram. Echocardiography is a painless test that uses sound waves to create images of your heart. It provides your doctor with information about the size and shape of your heart and how well your heart's chambers and valves are working. This procedure takes approximately one hour. There are no restrictions for this procedure. Please do NOT wear cologne, perfume, aftershave, or lotions (deodorant is allowed). Please arrive 15 minutes prior to your appointment time.    Follow-Up: At [facility], you and your health needs are our priority.  As part of our continuing mission to provide you with exceptional heart care, we have created designated Provider Care Teams.  These Care Teams include your primary Cardiologist (physician) and Advanced Practice Providers (APPs -  Physician Assistants and Nurse Practitioners) who all work together to provide you with the care you need, when you need it.  We recommend signing up for the patient portal called "MyChart".  Sign up information is provided on this After Visit Summary.  MyChart is used to connect with patients for Virtual Visits (Telemedicine).  Patients are able to view lab/test results, encounter notes, upcoming appointments, etc.  Non-urgent messages can be sent to your provider as well.   To learn more about what you can do with MyChart, go to [email or web address].    Your next appointment:   1 year(s)  Provider:   Dr. Dietrich Pates

## 2022-08-12 NOTE — Telephone Encounter (Signed)
Carlota at [facility] and they are processing the specimen and they did receive the signed form

## 2022-08-14 ENCOUNTER — Other Ambulatory Visit: Payer: Self-pay

## 2022-08-14 ENCOUNTER — Telehealth: Payer: Self-pay

## 2022-08-14 LAB — TEST AUTHORIZATION

## 2022-08-14 LAB — PAT ID TIQ DOC: Test Affected: 395

## 2022-08-14 LAB — URINE CULTURE
MICRO NUMBER:: 15073067
SPECIMEN QUALITY:: ADEQUATE

## 2022-08-14 MED ORDER — SULFAMETHOXAZOLE-TRIMETHOPRIM 800-160 MG PO TABS: 1.0000 | ORAL_TABLET | Freq: Two times a day (BID) | ORAL | 0 refills | Status: DC

## 2022-08-14 NOTE — Telephone Encounter (Signed)
ERROR

## 2022-08-14 NOTE — Telephone Encounter (Signed)
Sulfamath trimethophi 800/160mg  14 tablets

## 2022-08-14 NOTE — Telephone Encounter (Signed)
-----   Message from Alveria Apley, NP sent at 08/14/2022 11:54 AM EDT ----- Urine culture came back with positive urinary tract infection. Recommend to start treatment with Bactrim DS 800/160mg  tablet, take 1 tablet every 12 hours for 7 days. Follow up if not improved.

## 2022-08-14 NOTE — Telephone Encounter (Signed)
Pt called back and states the medication [facility] sent is is AZO and her insurance does not cover this. She is only going to take what her insurance will cover and that is the [employer]

## 2022-09-04 ENCOUNTER — Other Ambulatory Visit: Payer: Self-pay | Admitting: Urology

## 2022-09-14 ENCOUNTER — Other Ambulatory Visit: Payer: Self-pay

## 2022-09-14 MED ORDER — ATORVASTATIN CALCIUM 40 MG PO TABS: 40.0000 mg | ORAL_TABLET | Freq: Every evening | ORAL | 3 refills | Status: AC

## 2022-09-17 ENCOUNTER — Ambulatory Visit (HOSPITAL_COMMUNITY): Payer: Medicare Other | Attending: Cardiology

## 2022-09-17 DIAGNOSIS — I447 Left bundle-branch block, unspecified: Secondary | ICD-10-CM | POA: Diagnosis present

## 2022-09-17 LAB — ECHOCARDIOGRAM COMPLETE
Area-P 1/2: 3.39 cm2
S' Lateral: 2.6 cm

## 2022-09-21 ENCOUNTER — Telehealth: Payer: Self-pay | Admitting: Cardiology

## 2022-09-21 NOTE — Telephone Encounter (Signed)
Patient returned RN's call regarding results. 

## 2022-09-21 NOTE — Telephone Encounter (Signed)
-----   Message from Meriam Sprague sent at 09/20/2022  3:06 PM EDT ----- Her echo shows normal pumping function. The heart is a little stiff which means we need to ensure blood pressure is well controlled going forward. There is no significant valve disease.

## 2022-09-21 NOTE — Telephone Encounter (Signed)
The patient has been notified of the result and verbalized understanding.  All questions (if any) were answered. Frutoso Schatz, RN 09/21/2022 8:45 AM

## 2022-09-28 ENCOUNTER — Other Ambulatory Visit: Payer: Self-pay | Admitting: *Deleted

## 2022-09-28 MED ORDER — VALSARTAN 80 MG PO TABS: 80.0000 mg | ORAL_TABLET | Freq: Every day | ORAL | 3 refills | Status: DC

## 2022-10-09 ENCOUNTER — Encounter (HOSPITAL_BASED_OUTPATIENT_CLINIC_OR_DEPARTMENT_OTHER): Payer: Self-pay | Admitting: Urology

## 2022-10-13 ENCOUNTER — Encounter (HOSPITAL_BASED_OUTPATIENT_CLINIC_OR_DEPARTMENT_OTHER): Payer: Self-pay | Admitting: Urology

## 2022-10-13 NOTE — Progress Notes (Addendum)
Spoke w/ via phone for pre-op interview--- pt Lab needs dos---- [employer] results------ current EKG in epic/ chart COVID test -----patient states asymptomatic no test needed Arrive at ------- 1030 on 10-20-2022 NPO after MN NO Solid Food.  Clear liquids from MN until--- 0930 Med rec completed Medications to take morning of surgery ----- none Diabetic medication ----- do not take metformin morning of surgery Patient instructed no nail polish to be worn day of surgery Patient instructed to bring photo id and insurance card day of surgery Patient aware to have Driver (ride ) / caregiver    for 24 hours after surgery -- husband, alan Patient Special Instructions ----- n/a Pre-Op special Instructions -----  received medical clearance from pt's pcp, Chyrl Civatte PA dated 09-29-2022 via fax from Dr Arita Miss office, placed w/ chart.  Per pt is not taking ASA, was told by cardiologist did not need to. Patient verbalized understanding of instructions that were given at this phone interview. Patient denies shortness of breath, chest pain, fever, cough at this phone interview.   Anesthesia Review:  HTN;   hx typical AFlutter / SVT s/p 11/ 2014  RF ablation;  chronic LBBB;  mild OSA no cpap use in 6 months per pt bed elevated head and lost weight;  DM2  PCP:  Chyrl Civatte PA  (lov 09-29-2022 note w/ chart) Cardiologist :  Dr Jon Billings ( lov 08-12-2022) EKG : 08-12-2022 Echo : 09-17-2022 Stress test: 04-25-2012 Cardiac Cath : no Activity level:  denies sob w/ normal activity Sleep Study/ CPAP : yes/ no Fasting Blood Sugar :  105--120    / Checks Blood Sugar -- times a day:  2 times weekly Blood Thinner/ Instructions /Last Dose:no ASA / Instructions/ Last Dose : no

## 2022-10-19 NOTE — H&P (Signed)
CC/HPI: CC: Gross hematuria, hx of bladder cancer   HPI:  06/03/2020  77 year old female was experiencing gross hematuria about a week ago. She has had several episodes since then. She had some mild pelvic pain yesterday. She was prescribe ciprofloxacin by urgent care. She denies any significant change in voiding complaints. No dysuria. She has a history of low-grade bladder cancer but has not had a cystoscopy in 7 or 8 years. She when a period of time without recurrence and therefore she was released from surveillance cystoscopies. She also has a history of tiny renal calculi.   06/05/2020  Patient presents today for cystoscopy. She has not been scheduled for CT yet.   08/14/2022: Ms. Arletha Grippe is a pleasant 77 year old female who has a history of bladder cancer over 20 years ago. She underwent a cystoscopy in 2022 with Dr. Alvester Morin due to gross hematuria. This was negative. CT imaging showed small tiny bilateral stones. Today, she presents with concerns of dysuria and trace blood in her urine. She tells me she has not seen blood in her urine but her family doctor told her she had blood in her urine. She had a UA and culture done at her primary care office this week. She presents today for follow-up and concerns since she was having blood and pain. She was not started on antibiotics. 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   09/02/22: 77 year old woman with a history of low-grade bladder cancer and most recent cystoscopy 2022 here for recurrent UTIs, urinary incontinence and concern for bladder cancer recurrence. Patient is not experience any gross hematuria recently but is overall concerned about the possibility of bladder cancer. She has been wearing a pad for some increased urge incontinence. She denies any stress incontinence. She does have vaginal estradiol cream but has not been using it regularly.   10/12/2022: 77 year old female presents today for preoperative  appointment prior to undergoing cystoscopy with potential TURBT. Doing very well and denies changes to her health history. She has occasional intermittent dysuria but this has been ongoing for the past year. She denies chest pain, shortness of breath. She is not currently taking any blood thinners or other high risk medications.     ALLERGIES: Codeine Derivatives    MEDICATIONS: Metformin Hcl  Aspirin 81 MG TABS Oral  Atorvastatin Calcium 40 MG Oral Tablet Oral  Jardiance 10 mg tablet 1 tablet PO Daily  Valsartan 80 mg tablet     GU PSH: Cystoscopy - 09/02/2022, 2022 Locm 300-399Mg /Ml Iodine,1Ml - 2022     NON-GU PSH: Back surgery Hysterectomy     GU PMH: Chronic cystitis (with hematuria) - 09/02/2022 History of bladder cancer (Stable) - 09/02/2022, - 2022, - 2022, Bladder Cancer, - 2014 Urge incontinence - 09/02/2022 Acute Cystitis/UTI - 08/14/2022 Microscopic hematuria - 08/14/2022 Gross hematuria - 2022, - 2022, - 2022 History of urolithiasis - 2022, Nephrolithiasis, - 2014 Bladder Cancer, Unspec, Bladder cancer - 2015 Other Disorder Kidney/ureter, Renal mass, left - 2015 Renal calculus, Nephrolithiasis - 2014    NON-GU PMH: Encounter for general adult medical examination without abnormal findings, Encounter for preventive health examination - 2015 Personal history of other endocrine, nutritional and metabolic disease, History of hypercholesterolemia - 2014 Anxiety Asthma Diabetes Type 2 Hypercholesterolemia Hypertension Sleep Apnea    FAMILY HISTORY: 1 Daughter - Other 1 son - Other Brain tumor - Mother Family Health Status Number - Runs In Family Kidney Failure - Father Urologic Disorder -  Father   SOCIAL HISTORY: Marital Status: Married Preferred Language: English; Race: White Current Smoking Status: Patient has never smoked.   Tobacco Use Assessment Completed: Used Tobacco in last 30 days? Drinks 1 caffeinated drink per day.     Notes: Never A Smoker, Previous  History Of Smoking, Occupation:, Caffeine Use, Alcohol Use, Marital History - Currently Married   REVIEW OF SYSTEMS:    GU Review Female:   Patient reports burning /pain with urination. Patient denies frequent urination, hard to postpone urination, get up at night to urinate, leakage of urine, stream starts and stops, trouble starting your stream, have to strain to urinate, and being pregnant.  Gastrointestinal (Upper):   Patient denies nausea, vomiting, and indigestion/ heartburn.  Gastrointestinal (Lower):   Patient denies diarrhea and constipation.  Constitutional:   Patient denies fever, night sweats, weight loss, and fatigue.  Eyes:   Patient denies blurred vision and double vision.  Hematologic/Lymphatic:   Patient denies swollen glands and easy bruising.  Cardiovascular:   Patient denies leg swelling and chest pains.  Respiratory:   Patient denies cough and shortness of breath.  Musculoskeletal:   Patient denies back pain and joint pain.  Neurological:   Patient denies headaches and dizziness.  Psychologic:   Patient denies depression and anxiety.   VITAL SIGNS:      10/12/2022 10:15 AM  Weight 162 lb / 73.48 kg  Height 66 in / 167.64 cm  BP 115/62 mmHg  Pulse 73 /min  BMI 26.1 kg/m   MULTI-SYSTEM PHYSICAL EXAMINATION:    Constitutional: Well-nourished. No physical deformities. Normally developed. Good grooming.  Respiratory: No labored breathing, no use of accessory muscles.   Cardiovascular: Normal temperature, normal extremity pulses, no swelling, no varicosities.   Skin: No paleness, no jaundice, no cyanosis. No lesion, no ulcer, no rash.  Neurologic / Psychiatric: Oriented to time, oriented to place, oriented to person. No depression, no anxiety, no agitation.  Gastrointestinal: No mass, no tenderness, no rigidity, non obese abdomen.     Complexity of Data:  Source Of History:  Patient  Records Review:   Previous Doctor Records, Previous Patient Records  Urine Test  Review:   Urinalysis   10/12/22  Urinalysis  Urine Appearance Slightly Cloudy   Urine Color Yellow   Urine Glucose 2+ mg/dL  Urine Bilirubin Neg mg/dL  Urine Ketones Neg mg/dL  Urine Specific Gravity 1.020   Urine Blood 2+ ery/uL  Urine pH 5.5   Urine Protein Neg mg/dL  Urine Urobilinogen 0.2 mg/dL  Urine Nitrites Neg   Urine Leukocyte Esterase Neg leu/uL  Urine WBC/hpf 0 - 5/hpf   Urine RBC/hpf 0 - 2/hpf   Urine Epithelial Cells 6 - 10/hpf   Urine Bacteria Many (>50/hpf)   Urine Mucous Present   Urine Yeast NS (Not Seen)   Urine Trichomonas Not Present   Urine Cystals NS (Not Seen)   Urine Casts NS (Not Seen)   Urine Sperm Not Present    PROCEDURES:          Urinalysis w/Scope - 81001 Dipstick Dipstick Cont'd Micro  Color: Yellow Bilirubin: Neg WBC/hpf: 0 - 5/hpf  Appearance: Slightly Cloudy Ketones: Neg RBC/hpf: 0 - 2/hpf  Specific Gravity: 1.020 Blood: 2+ Bacteria: Many (>50/hpf)  pH: 5.5 Protein: Neg Cystals: NS (Not Seen)  Glucose: 2+ Urobilinogen: 0.2 Casts: NS (Not Seen)    Nitrites: Neg Trichomonas: Not Present    Leukocyte Esterase: Neg Mucous: Present      Epithelial Cells: 6 -  10/hpf      Yeast: NS (Not Seen)      Sperm: Not Present    Notes:      ASSESSMENT:      ICD-10 Details  1 GU:   Chronic cystitis (with hematuria) - N30.21 Chronic, Stable  2   Microscopic hematuria - R31.21 Chronic, Stable  3   History of bladder cancer - Z85.51 Chronic, Stable   PLAN:           Orders Labs Urinalysis, CULTURE, URINE          Document Letter(s):  Created for Patient: Clinical Summary         Notes:   All of her questions regarding her upcoming TURBT were answered to the best of my ability. Her urine will be sent for culture and I will treat according to culture results if necessary. Advised strict return precautions in the interval. She voiced her understanding

## 2022-10-20 ENCOUNTER — Other Ambulatory Visit: Payer: Self-pay

## 2022-10-20 ENCOUNTER — Ambulatory Visit (HOSPITAL_BASED_OUTPATIENT_CLINIC_OR_DEPARTMENT_OTHER): Payer: Medicare Other | Admitting: Anesthesiology

## 2022-10-20 ENCOUNTER — Ambulatory Visit (HOSPITAL_BASED_OUTPATIENT_CLINIC_OR_DEPARTMENT_OTHER)
Admission: RE | Admit: 2022-10-20 | Discharge: 2022-10-20 | Disposition: A | Discharge status: Home / Self Care | Payer: Medicare Other | Attending: Urology | Admitting: Urology

## 2022-10-20 ENCOUNTER — Encounter (HOSPITAL_BASED_OUTPATIENT_CLINIC_OR_DEPARTMENT_OTHER): Payer: Self-pay | Admitting: Urology

## 2022-10-20 ENCOUNTER — Encounter (HOSPITAL_BASED_OUTPATIENT_CLINIC_OR_DEPARTMENT_OTHER)
Admission: RE | Disposition: A | Discharge status: Home / Self Care | Payer: Self-pay | Source: Home / Self Care | Attending: Urology

## 2022-10-20 DIAGNOSIS — C679 Malignant neoplasm of bladder, unspecified: Secondary | ICD-10-CM | POA: Diagnosis not present

## 2022-10-20 DIAGNOSIS — Z08 Encounter for follow-up examination after completed treatment for malignant neoplasm: Secondary | ICD-10-CM | POA: Diagnosis not present

## 2022-10-20 DIAGNOSIS — E785 Hyperlipidemia, unspecified: Secondary | ICD-10-CM

## 2022-10-20 DIAGNOSIS — Z8551 Personal history of malignant neoplasm of bladder: Secondary | ICD-10-CM | POA: Diagnosis not present

## 2022-10-20 DIAGNOSIS — I10 Essential (primary) hypertension: Secondary | ICD-10-CM

## 2022-10-20 DIAGNOSIS — F419 Anxiety disorder, unspecified: Secondary | ICD-10-CM

## 2022-10-20 DIAGNOSIS — Z01818 Encounter for other preprocedural examination: Secondary | ICD-10-CM

## 2022-10-20 HISTORY — DX: Type 2 diabetes mellitus without complications: E11.9

## 2022-10-20 HISTORY — DX: Presence of external hearing-aid: Z97.4

## 2022-10-20 HISTORY — DX: Polyneuropathy, unspecified: G62.9

## 2022-10-20 HISTORY — DX: Personal history of other diseases of the circulatory system: Z86.79

## 2022-10-20 HISTORY — DX: Personal history of other (healed) physical injury and trauma: Z87.828

## 2022-10-20 HISTORY — DX: Personal history of other diseases of the respiratory system: Z87.09

## 2022-10-20 HISTORY — DX: Unspecified urinary incontinence: R32

## 2022-10-20 HISTORY — DX: Malignant neoplasm of bladder, unspecified: C67.9

## 2022-10-20 HISTORY — PX: TRANSURETHRAL RESECTION OF BLADDER TUMOR: SHX2575

## 2022-10-20 LAB — POCT I-STAT, CHEM 8
BUN: 19 mg/dL (ref 8–23)
Calcium, Ion: 1.23 mmol/L (ref 1.15–1.40)
Chloride: 103 mmol/L (ref 98–111)
Creatinine, Ser: 0.6 mg/dL (ref 0.44–1.00)
Glucose, Bld: 103 mg/dL — ABNORMAL HIGH (ref 70–99)
HCT: 40 % (ref 36.0–46.0)
Hemoglobin: 13.6 g/dL (ref 12.0–15.0)
Potassium: 3.8 mmol/L (ref 3.5–5.1)
Sodium: 143 mmol/L (ref 135–145)
TCO2: 26 mmol/L (ref 22–32)

## 2022-10-20 SURGERY — TURBT (TRANSURETHRAL RESECTION OF BLADDER TUMOR)
Anesthesia: General | Site: Bladder

## 2022-10-20 MED ORDER — CEFAZOLIN SODIUM-DEXTROSE 2-4 GM/100ML-% IV SOLN
2.0000 g | INTRAVENOUS | Status: AC
  Administered 2022-10-20: 2 g via INTRAVENOUS

## 2022-10-20 MED ORDER — ACETAMINOPHEN 10 MG/ML IV SOLN: 1000.0000 mg | Freq: Once | INTRAVENOUS | Status: DC | PRN

## 2022-10-20 MED ORDER — FENTANYL CITRATE (PF) 100 MCG/2ML IJ SOLN: 25.0000 ug | INTRAMUSCULAR | Status: DC | PRN

## 2022-10-20 MED ORDER — STERILE WATER FOR IRRIGATION IR SOLN
Status: DC | PRN
Start: 2022-10-20 — End: 2022-10-20
  Administered 2022-10-20: 3000 mL

## 2022-10-20 MED ORDER — LACTATED RINGERS IV SOLN: INTRAVENOUS | Status: DC

## 2022-10-20 MED ORDER — ONDANSETRON HCL 4 MG/2ML IJ SOLN
INTRAMUSCULAR | Status: AC
  Filled 2022-10-20: qty 2

## 2022-10-20 MED ORDER — CEFAZOLIN SODIUM-DEXTROSE 2-4 GM/100ML-% IV SOLN
INTRAVENOUS | Status: AC
  Filled 2022-10-20: qty 100

## 2022-10-20 MED ORDER — PROPOFOL 10 MG/ML IV BOLUS
INTRAVENOUS | Status: DC | PRN
  Administered 2022-10-20: 120 mg via INTRAVENOUS

## 2022-10-20 MED ORDER — DEXAMETHASONE SODIUM PHOSPHATE 10 MG/ML IJ SOLN
INTRAMUSCULAR | Status: AC
  Filled 2022-10-20: qty 1

## 2022-10-20 MED ORDER — OXYCODONE HCL 5 MG PO TABS
5.0000 mg | ORAL_TABLET | Freq: Three times a day (TID) | ORAL | 0 refills | Status: DC | PRN
Start: 2022-10-20 — End: 2023-06-30

## 2022-10-20 MED ORDER — FENTANYL CITRATE (PF) 100 MCG/2ML IJ SOLN
INTRAMUSCULAR | Status: DC | PRN
  Administered 2022-10-20: 25 ug via INTRAVENOUS

## 2022-10-20 MED ORDER — DEXAMETHASONE SODIUM PHOSPHATE 10 MG/ML IJ SOLN
INTRAMUSCULAR | Status: DC | PRN
  Administered 2022-10-20: 4 mg via INTRAVENOUS

## 2022-10-20 MED ORDER — LIDOCAINE HCL (PF) 2 % IJ SOLN
INTRAMUSCULAR | Status: AC
  Filled 2022-10-20: qty 5

## 2022-10-20 MED ORDER — EPHEDRINE SULFATE-NACL 50-0.9 MG/10ML-% IV SOSY
PREFILLED_SYRINGE | INTRAVENOUS | Status: DC | PRN
  Administered 2022-10-20 (×2): 10 mg via INTRAVENOUS

## 2022-10-20 MED ORDER — FENTANYL CITRATE (PF) 100 MCG/2ML IJ SOLN
INTRAMUSCULAR | Status: AC
  Filled 2022-10-20: qty 2

## 2022-10-20 MED ORDER — LIDOCAINE 2% (20 MG/ML) 5 ML SYRINGE
INTRAMUSCULAR | Status: DC | PRN
  Administered 2022-10-20: 60 mg via INTRAVENOUS

## 2022-10-20 MED ORDER — ONDANSETRON HCL 4 MG/2ML IJ SOLN
INTRAMUSCULAR | Status: DC | PRN
Start: 2022-10-20 — End: 2022-10-20
  Administered 2022-10-20: 4 mg via INTRAVENOUS

## 2022-10-20 MED ORDER — PROPOFOL 10 MG/ML IV BOLUS
INTRAVENOUS | Status: AC
  Filled 2022-10-20: qty 20

## 2022-10-20 SURGICAL SUPPLY — 21 items
BAG DRAIN URO-CYSTO SKYTR STRL (DRAIN) ×1 IMPLANT
BAG DRN RND TRDRP ANRFLXCHMBR (UROLOGICAL SUPPLIES)
BAG DRN UROCATH (DRAIN) ×1
BAG URINE DRAIN 2000ML AR STRL (UROLOGICAL SUPPLIES) IMPLANT
CATH FOLEY 2WAY SLVR 5CC 18FR (CATHETERS) IMPLANT
CLOTH BEACON ORANGE TIMEOUT ST (SAFETY) ×1 IMPLANT
ELECT REM PT RETURN 9FT ADLT (ELECTROSURGICAL) ×1
ELECTRODE REM PT RTRN 9FT ADLT (ELECTROSURGICAL) ×1 IMPLANT
GLOVE BIO SURGEON STRL SZ 6.5 (GLOVE) ×1 IMPLANT
GOWN STRL REUS W/TWL LRG LVL3 (GOWN DISPOSABLE) ×1 IMPLANT
HOLDER FOLEY CATH W/STRAP (MISCELLANEOUS) IMPLANT
IV NS IRRIG 3000ML ARTHROMATIC (IV SOLUTION) ×1 IMPLANT
KIT TURNOVER CYSTO (KITS) ×1 IMPLANT
LOOP CUT BIPOLAR 24F LRG (ELECTROSURGICAL) IMPLANT
MANIFOLD NEPTUNE II (INSTRUMENTS) ×1 IMPLANT
PACK CYSTO (CUSTOM PROCEDURE TRAY) ×1 IMPLANT
SLEEVE SCD COMPRESS KNEE MED (STOCKING) ×1 IMPLANT
SYR TOOMEY IRRIG 70ML (MISCELLANEOUS) ×1
SYRINGE TOOMEY IRRIG 70ML (MISCELLANEOUS) ×1 IMPLANT
TUBE CONNECTING 12X1/4 (SUCTIONS) ×1 IMPLANT
TUBING UROLOGY SET (TUBING) ×1 IMPLANT

## 2022-10-20 NOTE — Anesthesia Postprocedure Evaluation (Signed)
Anesthesia Post Note  Patient: Marcia Norris  Procedure(s) Performed: TRANSURETHRAL FULGERATION OF BLADDER TUMOR (TURBT) (Bladder)     Patient location during evaluation: PACU Anesthesia Type: General Level of consciousness: awake and alert Pain management: pain level controlled Vital Signs Assessment: post-procedure vital signs reviewed and stable Respiratory status: spontaneous breathing, nonlabored ventilation, respiratory function stable and patient connected to nasal cannula oxygen Cardiovascular status: blood pressure returned to baseline and stable Postop Assessment: no apparent nausea or vomiting Anesthetic complications: no   No notable events documented.  Last Vitals:  Vitals:   10/20/22 1351 10/20/22 1431  BP:  (!) 104/50  Pulse: 67 68  Resp: 17 18  Temp: 36.4 C   SpO2: 95% 97%    Last Pain:  Vitals:   10/20/22 1431  TempSrc:   PainSc: 0-No pain                 Earl Lites P Mahalie Kanner

## 2022-10-20 NOTE — Anesthesia Procedure Notes (Signed)
Procedure Name: LMA Insertion Date/Time: 10/20/2022 12:55 PM  Performed by: Briant Sites, CRNAPre-anesthesia Checklist: Patient identified, Emergency Drugs available, Suction available and Patient being monitored Patient Re-evaluated:Patient Re-evaluated prior to induction Oxygen Delivery Method: Circle system utilized Preoxygenation: Pre-oxygenation with 100% oxygen Induction Type: IV induction Ventilation: Mask ventilation without difficulty LMA: LMA inserted LMA Size: 4.0 Number of attempts: 1 Airway Equipment and Method: Bite block Placement Confirmation: positive ETCO2 Tube secured with: Tape Dental Injury: Teeth and Oropharynx as per pre-operative assessment

## 2022-10-20 NOTE — Transfer of Care (Signed)
Immediate Anesthesia Transfer of Care Note  Patient: Marcia Norris  Procedure(s) Performed: TRANSURETHRAL FULGERATION OF BLADDER TUMOR (TURBT) (Bladder)  Patient Location: PACU  Anesthesia Type:General  Level of Consciousness: awake, alert , and oriented  Airway & Oxygen Therapy: Patient Spontanous Breathing and Patient connected to nasal cannula oxygen  Post-op Assessment: Report given to RN  Post vital signs: Reviewed and stable  Last Vitals:  Vitals Value Taken Time  BP 115/54 10/20/22 1320  Temp    Pulse 66 10/20/22 1321  Resp 17 10/20/22 1321  SpO2 100 % 10/20/22 1321  Vitals shown include unfiled device data.  Last Pain:  Vitals:   10/20/22 1027  TempSrc: Oral         Complications: No notable events documented.

## 2022-10-20 NOTE — Discharge Instructions (Addendum)
Post Bladder Surgery Instructions ° ° °General instructions: °   ° Your recent bladder surgery requires very little post hospital care but some definite precautions. ° °Despite the fact that no skin incisions were used, the area around the bladder incisions are raw and covered with scabs to promote healing and prevent bleeding. Certain precautions are needed to insure that the scabs are not disturbed over the next 2-4 weeks while the healing proceeds. ° °Because the raw surface inside your bladder and the irritating effects of urine you may expect frequency of urination and/or urgency (a stronger desire to urinate) and perhaps even getting up at night more often. This will usually resolve or improve slowly over the healing period. You may see some blood in your urine over the first 6 weeks. Do not be alarmed, even if the urine was clear for a while. Get off your feet and drink lots of fluids until clearing occurs. If you start to pass clots or Fleet't improve call us. ° °Catheter: (If you are discharged with a catheter.) ° °1. Keep your catheter secured to your leg at all times with tape or the supplied strap. °2. You may experience leakage of urine around your catheter- as long as the  °catheter continues to drain, this is normal.  If your catheter stops draining  °go to the ER. °3. You may also have blood in your urine, even after it has been clear for  °several days; you may even pass some small blood clots or other material.  This  °is normal as well.  If this happens, sit down and drink plenty of water to help  °make urine to flush out your bladder.  If the blood in your urine becomes worse  °after doing this, contact our office or return to the ER. °4. You may use the leg bag (small bag) during the day, but use the large bag at  °night. ° °Diet: ° °You may return to your normal diet immediately. Because of the raw surface of your bladder, alcohol, spicy foods, foods high in acid and drinks with caffeine may  cause irritation or frequency and should be used in moderation. To keep your urine flowing freely and avoid constipation, drink plenty of fluids during the day (8-10 glasses). Tip: Avoid cranberry juice because it is very acidic. ° °Activity: ° °Your physical activity doesn't need to be restricted. However, if you are very active, you may see some blood in the urine. We suggest that you reduce your activity under the circumstances until the bleeding has stopped. ° °Bowels: ° °It is important to keep your bowels regular during the postoperative period. Straining with bowel movements can cause bleeding. A bowel movement every other day is reasonable. Use a mild laxative if needed, such as milk of magnesia 2-3 tablespoons, or 2 Dulcolax tablets. Call if you continue to have problems. If you had been taking narcotics for pain, before, during or after your surgery, you may be constipated. Take a laxative if necessary. ° ° ° °Medication: ° °You should resume your pre-surgery medications unless told not to. In addition you may be given an antibiotic to prevent or treat infection. Antibiotics are not always necessary. All medication should be taken as prescribed until the bottles are finished unless you are having an unusual reaction to one of the drugs. ° ° °Post Anesthesia Home Care Instructions ° °Activity: °Get plenty of rest for the remainder of the day. A responsible adult should stay with you for   24 hours following the procedure.  °For the next 24 hours, DO NOT: °-Drive a car °-Operate machinery °-Drink alcoholic beverages °-Take any medication unless instructed by your physician °-Make any legal decisions or sign important papers. ° °Meals: °Start with liquid foods such as gelatin or soup. Progress to regular foods as tolerated. Avoid greasy, spicy, heavy foods. If nausea and/or vomiting occur, drink only clear liquids until the nausea and/or vomiting subsides. Call your physician if vomiting continues. ° °Special  Instructions/Symptoms: °Your throat may feel dry or sore from the anesthesia or the breathing tube placed in your throat during surgery. If this causes discomfort, gargle with warm salt water. The discomfort should disappear within 24 hours. ° ° °

## 2022-10-20 NOTE — Anesthesia Preprocedure Evaluation (Signed)
Anesthesia Evaluation  Patient identified by MRN, date of birth, ID band Patient awake    Reviewed: Allergy & Precautions, NPO status , Patient's Chart, lab work & pertinent test results  Airway Mallampati: II  TM Distance: >3 FB Neck ROM: Full    Dental no notable dental hx.    Pulmonary asthma , sleep apnea    Pulmonary exam normal        Cardiovascular hypertension, + dysrhythmias (s/p ablation) Atrial Fibrillation and Supra Ventricular Tachycardia  Rhythm:Regular Rate:Normal     Neuro/Psych   Anxiety Depression       GI/Hepatic negative GI ROS, Neg liver ROS,,,  Endo/Other  diabetes, Type 2    Renal/GU negative Renal ROS Bladder dysfunction      Musculoskeletal  (+) Arthritis , Osteoarthritis,    Abdominal Normal abdominal exam  (+)   Peds  Hematology negative hematology ROS (+)   Anesthesia Other Findings   Reproductive/Obstetrics                             Anesthesia Physical Anesthesia Plan  ASA: 3  Anesthesia Plan: General   Post-op Pain Management:    Induction: Intravenous  PONV Risk Score and Plan: 3 and Ondansetron, Dexamethasone and Treatment may vary due to age or medical condition  Airway Management Planned: Mask and Oral ETT  Additional Equipment: None  Intra-op Plan:   Post-operative Plan: Extubation in OR  Informed Consent: I have reviewed the patients History and Physical, chart, labs and discussed the procedure including the risks, benefits and alternatives for the proposed anesthesia with the patient or authorized representative who has indicated his/her understanding and acceptance.     Dental advisory given  Plan Discussed with: CRNA  Anesthesia Plan Comments:        Anesthesia Quick Evaluation

## 2022-10-20 NOTE — Op Note (Signed)
Operative Note  Preoperative diagnosis:  1.  Bladder cancer  Postoperative diagnosis: 1.  Bladder cancer  Procedure(s): 1.  Cystoscopy with fulguration  Surgeon: Kasandra Knudsen, MD  Assistants:  None  Anesthesia:  General  Complications:  None  EBL:  minimal  Specimens: 1. none  Drains/Catheters: 1.  none  Intraoperative findings:   Normal urethra Bilateral orthotopic Uos Well healed prior TUR scar  Focal area of erythema on posterior left superior wall  Indication:  Marcia Norris is a 77 y.o. female with history of bladder cancer 20 years ago with recent repeat cystoscopy due to patient worry. She experienced gross hematuria in 2022 and cysto at that time was normal.  Description of procedure:  After risks and benefits of the procedure, informed consent was obtained.  The patient was taken back to the operating room placed in supine position.  Anesthesia was induced and antibiotics were administered.  She was repositioned in the dorsolithotomy position.  Patient was prepped and draped in the usual sterile fashion and timeout was performed.  A 23 French rigid cystoscope was placed in the urethral meatus and advanced in the bladder and direct visualization.  Findings are noted above.  The focal area of erythema concerning for early recurrence was again visualized.  Due to how small the area was the decision was made to proceed with fulguration only.  The Bugbee was used to fulgurate this area without difficulty.  Hemostasis was adequate with the irrigant turned off.  Reexamination of the bladder mucosa revealed no other abnormal areas.  The previously seen area proximal to the trigone did not appear abnormal on cystoscopy today.  The patient emerged anesthesia and was transferred to the PACU in stable condition.  Plan:  Discharged home today

## 2022-10-20 NOTE — Interval H&P Note (Signed)
History and Physical Interval Note:  10/20/2022 12:36 PM  Marcia Norris  has presented today for surgery, with the diagnosis of BLADDER CANCER.  The various methods of treatment have been discussed with the patient and family. After consideration of risks, benefits and other options for treatment, the patient has consented to  Procedure(s) with comments: TRANSURETHRAL RESECTION OF BLADDER TUMOR (TURBT) (N/A) - 45 MINS as a surgical intervention.  The patient's history has been reviewed, patient examined, no change in status, stable for surgery.  I have reviewed the patient's chart and labs.  Questions were answered to the patient's satisfaction.     Anden Bartolo D Kaoru Benda

## 2022-10-21 ENCOUNTER — Encounter (HOSPITAL_BASED_OUTPATIENT_CLINIC_OR_DEPARTMENT_OTHER): Payer: Self-pay | Admitting: Urology

## 2022-10-21 LAB — GLUCOSE, CAPILLARY: Glucose-Capillary: 90 mg/dL (ref 70–99)

## 2023-04-23 ENCOUNTER — Ambulatory Visit: Payer: Medicare Other | Admitting: Gastroenterology
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
## 2023-05-03 ENCOUNTER — Other Ambulatory Visit: Payer: Self-pay | Admitting: Urology

## 2023-05-10 NOTE — H&P (Signed)
 CC/HPI: cc: Gross hematuria, hx of bladder cancer, bladder prolapse   HPI:  06/03/2020  78 year old female was experiencing gross hematuria about a week ago. She has had several episodes since then. She had some mild pelvic pain yesterday. She was prescribe ciprofloxacin by urgent care. She denies any significant change in voiding complaints. No dysuria. She has a history of low-grade bladder cancer but has not had a cystoscopy in 7 or 8 years. She when a period of time without recurrence and therefore she was released from surveillance cystoscopies. She also has a history of tiny renal calculi.   06/05/2020  Patient presents today for cystoscopy. She has not been scheduled for CT yet.   08/14/2022: [name] is a pleasant 78 year old female who has a history of bladder cancer over 20 years ago. She underwent a cystoscopy in 2022 with Dr. Alvester Morin due to gross hematuria. This was negative. CT imaging showed small tiny bilateral stones. Today, she presents with concerns of dysuria and trace blood in her urine. She tells me she has not seen blood in her urine but her family doctor told her she had blood in her urine. She had a UA and culture done at her primary care office this week. She presents today for follow-up and concerns since she was having blood and pain. She was not started on antibiotics. 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   09/02/22: 78 year old woman with a history of low-grade bladder cancer and most recent cystoscopy 2022 here for recurrent UTIs, urinary incontinence and concern for bladder cancer recurrence. Patient is not experience any gross hematuria recently but is overall concerned about the possibility of bladder cancer. She has been wearing a pad for some increased urge incontinence. She denies any stress incontinence. She does have vaginal estradiol cream but has not been using it regularly.   10/12/2022: 78 year old female presents  today for preoperative appointment prior to undergoing cystoscopy with potential TURBT. Doing very well and denies changes to her health history. She has occasional intermittent dysuria but this has been ongoing for the past year. She denies chest pain, shortness of breath. She is not currently taking any blood thinners or other high risk medications.   10/30/2022: 78 year old woman with a history of low-grade bladder cancer and most recent cystoscopy 2022 here for recurrent UTIs, urinary incontinence and concern for bladder cancer recurrence. She underwent cystoscopy with fulguration last week and is here for follow-up. Overall she is doing well and not having any significant pain or hematuria. She is having worsening urinary urgency and nocturia at baseline. She has to wear a pad all the time and changes it during the day.   12/25/2022: Marcia Norris is a 78 year old female who presents today with concerns of her bladder prolapse. She has noticed this over the course of the last month. She has associated urgency and frequency. She denies fevers and chills.   12/3/2024[name] presents today for pessary fitting.   04/12/23: 78 yo woman with hx of bladder cancer also with bladder prolapse here for cystoscopy. Patient was fit for pessary in December however when she went home and used the bathroom the pessary fell out. She was unsure how to replace it. She is also overdue for surveillance cystoscopy.     ALLERGIES: Codeine Derivatives    MEDICATIONS: Macrobid 100 mg capsule  Metformin Hcl  Aspirin 81 MG TABS Oral  Atorvastatin Calcium 40 MG Oral Tablet Oral  Jardiance 10  mg tablet 1 tablet PO Daily  Valsartan 80 mg tablet     GU PSH: Cystoscopy - 09/02/2022, 2022 Locm 300-399Mg /Ml Iodine,1Ml - 2022     NON-GU PSH: Back surgery Hysterectomy Visit Complexity (formerly GPC1X) - 02/02/2023     GU PMH: Cystocele, Unspec - 02/02/2023, - 12/25/2022 Chronic cystitis (with hematuria) - 10/30/2022, -  10/12/2022, - 09/02/2022 History of bladder cancer - 10/30/2022, - 10/12/2022 (Stable), - 09/02/2022, - 2022, - 2022, Bladder Cancer, - 2014 Nocturia - 10/30/2022 Urge incontinence - 10/30/2022, - 09/02/2022 Urinary Urgency - 10/30/2022 Microscopic hematuria - 10/12/2022, - 08/14/2022 Acute Cystitis/UTI - 08/14/2022 Gross hematuria - 2022, - 2022, - 2022 History of urolithiasis - 2022, Nephrolithiasis, - 2014 Bladder Cancer, Unspec, Bladder cancer - 2015 Other Disorder Kidney/ureter, Renal mass, left - 2015 Renal calculus, Nephrolithiasis - 2014    NON-GU PMH: Encounter for general adult medical examination without abnormal findings, Encounter for preventive health examination - 2015 Personal history of other endocrine, nutritional and metabolic disease, History of hypercholesterolemia - 2014 Anxiety Asthma Diabetes Type 2 Hypercholesterolemia Hypertension Sleep Apnea    FAMILY HISTORY: 1 Daughter - Other 1 son - Other Brain tumor - Mother Family Health Status Number - Runs In Family Kidney Failure - Father Urologic Disorder - Father   SOCIAL HISTORY: Marital Status: Married Preferred Language: English; Race: White Current Smoking Status: Patient has never smoked.   Tobacco Use Assessment Completed: Used Tobacco in last 30 days? Drinks 1 caffeinated drink per day.     Notes: Never A Smoker, Previous History Of Smoking, Occupation:, Caffeine Use, Alcohol Use, Marital History - Currently Married   REVIEW OF SYSTEMS:    GU Review Female:   Patient denies frequent urination, hard to postpone urination, burning /pain with urination, get up at night to urinate, leakage of urine, stream starts and stops, trouble starting your stream, have to strain to urinate, and being pregnant.  Gastrointestinal (Upper):   Patient denies nausea, vomiting, and indigestion/ heartburn.  Gastrointestinal (Lower):   Patient denies diarrhea and constipation.  Constitutional:   Patient denies fever, night sweats,  weight loss, and fatigue.  Skin:   Patient denies skin rash/ lesion and itching.  Eyes:   Patient denies blurred vision and double vision.  Ears/ Nose/ Throat:   Patient denies sore throat and sinus problems.  Hematologic/Lymphatic:   Patient denies swollen glands and easy bruising.  Cardiovascular:   Patient denies chest pains and leg swelling.  Respiratory:   Patient denies cough and shortness of breath.  Endocrine:   Patient denies excessive thirst.  Musculoskeletal:   Patient denies back pain and joint pain.  Neurological:   Patient denies headaches and dizziness.  Psychologic:   Patient denies depression and anxiety.   VITAL SIGNS: None   GU PHYSICAL EXAMINATION:    External Genitalia: No hirsutism, no rash, no scarring, no cyst, no erythematous lesion, no papular lesion, no blanched lesion, no warty lesion. No edema.  Urethral Meatus: Normal size. Normal position. No discharge.  Vagina: Mild vaginal atrophy. Grade 2-3 anterior vaginal wall prolapse.  Cervix: S/P Hysterectomy  Uterus: S/P Hysterectomy   MULTI-SYSTEM PHYSICAL EXAMINATION:    Constitutional: Well-nourished. No physical deformities. Normally developed. Good grooming.  Neck: Neck symmetrical, not swollen. Normal tracheal position.  Respiratory: No labored breathing, no use of accessory muscles.   Skin: No paleness, no jaundice, no cyanosis. No lesion, no ulcer, no rash.  Neurologic / Psychiatric: Oriented to time, oriented to place, oriented to person. No depression,  no anxiety, no agitation.  Eyes: Normal conjunctivae. Normal eyelids.  Ears, Nose, Mouth, and Throat: Left ear no scars, no lesions, no masses. Right ear no scars, no lesions, no masses. Nose no scars, no lesions, no masses. Normal hearing. Normal lips.  Musculoskeletal: Normal gait and station of head and neck.     Complexity of Data:  Records Review:   Previous Patient Records, POC Tool  Urine Test Review:   Urinalysis   PROCEDURES:          Flexible Cystoscopy - 52000  Risks, benefits, and some of the potential complications of the procedure were discussed at length with the patient including infection, bleeding, voiding discomfort, urinary retention, fever, chills, sepsis, and others. All questions were answered. Informed consent was obtained. Antibiotic prophylaxis was given. Sterile technique and intraurethral analgesia were used.  Meatus:  Normal size. Normal location. Normal condition.  Urethra:  No hypermobility. No leakage.  Ureteral Orifices:  Normal location. Normal size. Normal shape. Effluxed clear urine.  Bladder:  Subcentimeter papillary bladder tumor left anterior lateral wall. Irregular mucosa by trigone.      The lower urinary tract was carefully examined. The procedure was well-tolerated and without complications. Antibiotic instructions were given. Instructions were given to call the office immediately for bloody urine, difficulty urinating, urinary retention, painful or frequent urination, fever, chills, nausea, vomiting or other illness. The patient stated that she understood these instructions and would comply with them.         Urinalysis w/Scope Dipstick Dipstick Cont'd Micro  Color: Yellow Bilirubin: Neg mg/dL WBC/hpf: NS (Not Seen)  Appearance: Clear Ketones: Neg mg/dL RBC/hpf: 0 - 2/hpf  Specific Gravity: 1.010 Blood: 1+ ery/uL Bacteria: Few (10-25/hpf)  pH: 5.5 Protein: Trace mg/dL Cystals: NS (Not Seen)  Glucose: Neg mg/dL Urobilinogen: 0.2 mg/dL Casts: NS (Not Seen)    Nitrites: Neg Trichomonas: Not Present    Leukocyte Esterase: Neg leu/uL Mucous: Not Present      Epithelial Cells: 0 - 5/hpf      Yeast: NS (Not Seen)      Sperm: Not Present    Notes: **QNS for spun micro**    ASSESSMENT:      ICD-10 Details  1 GU:   Cystocele, 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 Bladder cancer: Cystoscopy today shows small bladder tumor recurrence on anterior wall as well as irregular mucosa by trigone. We discussed risks and benefits of cystoscopy with TURBT and intravesical gemcitabine. These include but are not limited to pain, bleeding, infection, bladder perforation, need for additional treatment, need for catheter,, damage to surrounding structures.   2. Pelvic organ prolapse: Will get patient back into see [facility] for pessary fitting   3. Recurrent UTIs: Continue vaginal estradiol and Darrold Junker

## 2023-05-10 NOTE — Patient Instructions (Signed)
 SURGICAL WAITING ROOM VISITATION  Patients having surgery or a procedure may have no more than 2 support people in the waiting area - these visitors may rotate.    Children under the age of 27 must have an adult with them who is not the patient.  Due to an increase in RSV and influenza rates and associated hospitalizations, children ages 11 and under may not visit patients in [facility] hospitals.  Visitors with respiratory illnesses are discouraged from visiting and should remain at home.  If the patient needs to stay at the hospital during part of their recovery, the visitor guidelines for inpatient rooms apply. Pre-op nurse will coordinate an appropriate time for 1 support person to accompany patient in pre-op.  This support person may not rotate.    Please refer to the [facility] website for the visitor guidelines for Inpatients (after your surgery is over and you are in a regular room).       Your procedure is scheduled on: 05/13/23   Report to [facility] Main Entrance    Report to admitting at 8:15 AM   Call this number if you have problems the morning of surgery [phone]   Do not eat food or drink liquids:After Midnight. But may have sips of water with meds.        Oral Hygiene is also important to reduce your risk of infection.                                    Remember - BRUSH YOUR TEETH THE MORNING OF SURGERY WITH YOUR REGULAR TOOTHPASTE  DENTURES WILL BE REMOVED PRIOR TO SURGERY PLEASE DO NOT APPLY "Poly grip" OR ADHESIVES!!!   Stop all vitamins and herbal supplements 7 days before surgery.   Take these medicines the morning of surgery with A SIP OF WATER: Atorvastatin.               Do not take Diovan(Valsartan) the morning of surgery.   DO NOT TAKE ANY ORAL DIABETIC MEDICATIONS DAY OF YOUR SURGERY Do not take Metformin the morning of surgery.  Bring CPAP mask and tubing day of surgery.                              You may not have any metal  on your body including hair pins, jewelry, and body piercing             Do not wear make-up, lotions, powders, perfumes/cologne, or deodorant  Do not wear nail polish including gel and S&S, artificial/acrylic nails, or any other type of covering on natural nails including finger and toenails. If you have artificial nails, gel coating, etc. that needs to be removed by a nail salon please have this removed prior to surgery or surgery may need to be canceled/ delayed if the surgeon/ anesthesia feels like they are unable to be safely monitored.   Do not shave  48 hours prior to surgery.    Do not bring valuables to the hospital.  IS NOT             RESPONSIBLE   FOR VALUABLES.   Contacts, glasses, dentures or bridgework may not be worn into surgery.    DO NOT BRING YOUR HOME MEDICATIONS TO THE HOSPITAL. PHARMACY WILL DISPENSE MEDICATIONS LISTED ON YOUR MEDICATION LIST TO YOU DURING YOUR  ADMISSION IN THE HOSPITAL!    Patients discharged on the day of surgery will not be allowed to drive home.  Someone NEEDS to stay with you for the first 24 hours after anesthesia.   Special Instructions: Bring a copy of your healthcare power of attorney and living will documents the day of surgery if you haven't scanned them before.              Please read over the following fact sheets you were given: IF YOU HAVE QUESTIONS ABOUT YOUR PRE-OP INSTRUCTIONS PLEASE CALL [phone] [facility]   If you received a COVID test during your pre-op visit  it is requested that you wear a mask when out in public, stay away from anyone that may not be feeling well and notify your surgeon if you develop symptoms. If you test positive for Covid or have been in contact with anyone that has tested positive in the last 10 days please notify you surgeon.    [place] - Preparing for Surgery Before surgery, you can play an important role.  Because skin is not sterile, your skin needs to be as free of germs as possible.   You can reduce the number of germs on your skin by washing with CHG (chlorahexidine gluconate) soap before surgery.  CHG is an antiseptic cleaner which kills germs and bonds with the skin to continue killing germs even after washing. Please DO NOT use if you have an allergy to CHG or antibacterial soaps.  If your skin becomes reddened/irritated stop using the CHG and inform your nurse when you arrive at Short Stay. Do not shave (including legs and underarms) for at least 48 hours prior to the first CHG shower.  You may shave your face/neck.  Please follow these instructions carefully:  1.  Shower with CHG Soap the night before surgery and the  morning of surgery.  2.  If you choose to wash your hair, wash your hair first as usual with your normal  shampoo.  3.  After you shampoo, rinse your hair and body thoroughly to remove the shampoo.                             4.  Use CHG as you would any other liquid soap.  You can apply chg directly to the skin and wash.  Gently with a scrungie or clean washcloth.  5.  Apply the CHG Soap to your body ONLY FROM THE NECK DOWN.   Do   not use on face/ open                           Wound or open sores. Avoid contact with eyes, ears mouth and   genitals (private parts).                       Wash face,  Genitals (private parts) with your normal soap.             6.  Wash thoroughly, paying special attention to the area where your    surgery  will be performed.  7.  Thoroughly rinse your body with warm water from the neck down.  8.  DO NOT shower/wash with your normal soap after using and rinsing off the CHG Soap.                9.  Pat yourself  dry with a clean towel.            10.  Wear clean pajamas.            11.  Place clean sheets on your bed the night of your first shower and do not  sleep with pets. Day of Surgery : Do not apply any lotions/deodorants the morning of surgery.  Please wear clean clothes to the hospital/surgery center.  FAILURE TO  FOLLOW THESE INSTRUCTIONS MAY RESULT IN THE CANCELLATION OF YOUR SURGERY  PATIENT SIGNATURE_________________________________  NURSE SIGNATURE__________________________________  ________________________________________________________________________

## 2023-05-10 NOTE — Progress Notes (Signed)
 COVID Vaccine received:  []  No [x]  Yes Date of any COVID positive Test in last 90 days: no PCP - sara Salli Quarry PA @ [facility] on [facility] Cardiologist - Dr. Dietrich Pates  Chest x-ray -  EKG -  08/12/22 Epic Stress Test -  ECHO - 09/17/22 Epic Cardiac Cath -   Bowel Prep - [x]  No  []   Yes ______  Pacemaker / ICD device [x]  No []  Yes   Spinal Cord Stimulator:[x]  No []  Yes       History of Sleep Apnea? []  No [x]  Yes   CPAP used?- [x]  No []  Yes    Does the patient monitor blood sugar?          []  No [x]  Yes  []  N/A  Patient has: []  NO Hx DM   []  Pre-DM                 []  DM1  [x]   DM2 Does patient have a [employer] or Dexacom? [x]  No []  Yes   Fasting Blood Sugar Ranges- 120's Checks Blood Sugar ___2__ times a week  GLP1 agonist / usual dose - no GLP1 instructions:  SGLT-2 inhibitors / usual dose - no SGLT-2 instructions:   Blood Thinner / Instructions:no Aspirin Instructions:no  Comments:   Activity level: Patient is able to climb a flight of stairs without difficulty; [x]  No CP  [x]  No SOB,  Patient can /  perform ADLs without assistance.   Anesthesia review: LBBB, HTN, Murmur, DM, OSA  Patient denies shortness of breath, fever, cough and chest pain at PAT appointment.  Patient verbalized understanding and agreement to the Pre-Surgical Instructions that were given to them at this PAT appointment. Patient was also educated of the need to review these PAT instructions again prior to his/her surgery.I reviewed the appropriate phone numbers to call if they have any and questions or concerns.

## 2023-05-11 ENCOUNTER — Other Ambulatory Visit: Payer: Self-pay

## 2023-05-11 ENCOUNTER — Encounter (HOSPITAL_COMMUNITY)
Admission: RE | Admit: 2023-05-11 | Discharge: 2023-05-11 | Disposition: A | Discharge status: Home / Self Care | Source: Ambulatory Visit | Attending: Urology | Admitting: Urology

## 2023-05-11 VITALS — BP 137/70 | HR 78 | Temp 97.8°F | Resp 18 | Ht 65.5 in | Wt 158.0 lb

## 2023-05-11 DIAGNOSIS — I119 Hypertensive heart disease without heart failure: Secondary | ICD-10-CM | POA: Insufficient documentation

## 2023-05-11 DIAGNOSIS — R011 Cardiac murmur, unspecified: Secondary | ICD-10-CM | POA: Diagnosis not present

## 2023-05-11 DIAGNOSIS — I447 Left bundle-branch block, unspecified: Secondary | ICD-10-CM | POA: Diagnosis not present

## 2023-05-11 DIAGNOSIS — G4733 Obstructive sleep apnea (adult) (pediatric): Secondary | ICD-10-CM | POA: Diagnosis not present

## 2023-05-11 DIAGNOSIS — Z9889 Other specified postprocedural states: Secondary | ICD-10-CM | POA: Diagnosis not present

## 2023-05-11 DIAGNOSIS — E1142 Type 2 diabetes mellitus with diabetic polyneuropathy: Secondary | ICD-10-CM | POA: Insufficient documentation

## 2023-05-11 DIAGNOSIS — C679 Malignant neoplasm of bladder, unspecified: Secondary | ICD-10-CM | POA: Insufficient documentation

## 2023-05-11 DIAGNOSIS — Z7984 Long term (current) use of oral hypoglycemic drugs: Secondary | ICD-10-CM | POA: Diagnosis not present

## 2023-05-11 DIAGNOSIS — Z01812 Encounter for preprocedural laboratory examination: Secondary | ICD-10-CM | POA: Insufficient documentation

## 2023-05-11 DIAGNOSIS — B029 Zoster without complications: Secondary | ICD-10-CM | POA: Diagnosis not present

## 2023-05-11 DIAGNOSIS — E119 Type 2 diabetes mellitus without complications: Secondary | ICD-10-CM

## 2023-05-11 DIAGNOSIS — Z981 Arthrodesis status: Secondary | ICD-10-CM | POA: Insufficient documentation

## 2023-05-11 LAB — HEMOGLOBIN A1C
Hgb A1c MFr Bld: 6.7 % — ABNORMAL HIGH (ref 4.8–5.6)
Mean Plasma Glucose: 145.59 mg/dL

## 2023-05-11 LAB — CBC
HCT: 41 % (ref 36.0–46.0)
Hemoglobin: 13 g/dL (ref 12.0–15.0)
MCH: 30.2 pg (ref 26.0–34.0)
MCHC: 31.7 g/dL (ref 30.0–36.0)
MCV: 95.1 fL (ref 80.0–100.0)
Platelets: 193 10*3/uL (ref 150–400)
RBC: 4.31 MIL/uL (ref 3.87–5.11)
RDW: 13.5 % (ref 11.5–15.5)
WBC: 5.2 10*3/uL (ref 4.0–10.5)
nRBC: 0 % (ref 0.0–0.2)

## 2023-05-11 LAB — BASIC METABOLIC PANEL
Anion gap: 11 (ref 5–15)
BUN: 12 mg/dL (ref 8–23)
CO2: 24 mmol/L (ref 22–32)
Calcium: 9.2 mg/dL (ref 8.9–10.3)
Chloride: 106 mmol/L (ref 98–111)
Creatinine, Ser: 0.45 mg/dL (ref 0.44–1.00)
GFR, Estimated: >60 mL/min (ref >60)
Glucose, Bld: 90 mg/dL (ref 70–99)
Potassium: 3.8 mmol/L (ref 3.5–5.1)
Sodium: 141 mmol/L (ref 135–145)

## 2023-05-11 LAB — GLUCOSE, CAPILLARY: Glucose-Capillary: 96 mg/dL (ref 70–99)

## 2023-05-12 ENCOUNTER — Encounter (HOSPITAL_COMMUNITY): Payer: Self-pay
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
## 2023-05-12 NOTE — Anesthesia Preprocedure Evaluation (Signed)
 Anesthesia Evaluation  Patient identified by MRN, date of birth, ID band Patient awake    Reviewed: Allergy & Precautions, H&P , NPO status , Patient's Chart, lab work & pertinent test results  Airway Mallampati: II  TM Distance: >3 FB Neck ROM: Full    Dental no notable dental hx. (+) Teeth Intact, Dental Advisory Given   Pulmonary asthma , sleep apnea    Pulmonary exam normal breath sounds clear to auscultation       Cardiovascular hypertension, Pt. on medications + dysrhythmias  Rhythm:Regular Rate:Normal     Neuro/Psych   Anxiety Depression    negative neurological ROS     GI/Hepatic negative GI ROS, Neg liver ROS,,,  Endo/Other  diabetes, Type 2, Oral Hypoglycemic Agents    Renal/GU negative Renal ROS  negative genitourinary   Musculoskeletal  (+) Arthritis , Osteoarthritis,    Abdominal   Peds  Hematology negative hematology ROS (+)   Anesthesia Other Findings   Reproductive/Obstetrics negative OB ROS                             Anesthesia Physical Anesthesia Plan  ASA: 3  Anesthesia Plan: General   Post-op Pain Management: Ofirmev IV (intra-op)*   Induction: Intravenous  PONV Risk Score and Plan: 4 or greater and Ondansetron, Dexamethasone and Treatment may vary due to age or medical condition  Airway Management Planned: Oral ETT  Additional Equipment:   Intra-op Plan:   Post-operative Plan: Extubation in OR  Informed Consent: I have reviewed the patients History and Physical, chart, labs and discussed the procedure including the risks, benefits and alternatives for the proposed anesthesia with the patient or authorized representative who has indicated his/her understanding and acceptance.     Dental advisory given  Plan Discussed with: CRNA  Anesthesia Plan Comments: (See PAT note from 3/12 by Sherlie Ban PA-C )        Anesthesia Quick Evaluation

## 2023-05-13 ENCOUNTER — Ambulatory Visit (HOSPITAL_COMMUNITY)
Admission: RE | Admit: 2023-05-13 | Discharge: 2023-05-13 | Disposition: A | Discharge status: Home / Self Care | Attending: Urology | Admitting: Urology

## 2023-05-13 ENCOUNTER — Other Ambulatory Visit: Payer: Self-pay

## 2023-05-13 ENCOUNTER — Ambulatory Visit (HOSPITAL_COMMUNITY): Payer: Self-pay | Admitting: Medical

## 2023-05-13 ENCOUNTER — Ambulatory Visit (HOSPITAL_BASED_OUTPATIENT_CLINIC_OR_DEPARTMENT_OTHER): Admitting: Anesthesiology

## 2023-05-13 ENCOUNTER — Encounter (HOSPITAL_COMMUNITY): Payer: Self-pay | Admitting: Urology

## 2023-05-13 ENCOUNTER — Encounter (HOSPITAL_COMMUNITY)
Admission: RE | Disposition: A | Discharge status: Home / Self Care | Payer: Self-pay | Source: Home / Self Care | Attending: Urology

## 2023-05-13 DIAGNOSIS — J45909 Unspecified asthma, uncomplicated: Secondary | ICD-10-CM

## 2023-05-13 DIAGNOSIS — I10 Essential (primary) hypertension: Secondary | ICD-10-CM

## 2023-05-13 DIAGNOSIS — Z808 Family history of malignant neoplasm of other organs or systems: Secondary | ICD-10-CM | POA: Insufficient documentation

## 2023-05-13 DIAGNOSIS — N3021 Other chronic cystitis with hematuria: Secondary | ICD-10-CM | POA: Diagnosis not present

## 2023-05-13 DIAGNOSIS — E119 Type 2 diabetes mellitus without complications: Secondary | ICD-10-CM | POA: Diagnosis not present

## 2023-05-13 DIAGNOSIS — Z7984 Long term (current) use of oral hypoglycemic drugs: Secondary | ICD-10-CM | POA: Insufficient documentation

## 2023-05-13 DIAGNOSIS — M1990 Unspecified osteoarthritis, unspecified site: Secondary | ICD-10-CM | POA: Diagnosis not present

## 2023-05-13 DIAGNOSIS — F418 Other specified anxiety disorders: Secondary | ICD-10-CM | POA: Diagnosis not present

## 2023-05-13 DIAGNOSIS — Z87440 Personal history of urinary (tract) infections: Secondary | ICD-10-CM | POA: Diagnosis not present

## 2023-05-13 DIAGNOSIS — N8110 Cystocele, unspecified: Secondary | ICD-10-CM | POA: Insufficient documentation

## 2023-05-13 DIAGNOSIS — G4730 Sleep apnea, unspecified: Secondary | ICD-10-CM | POA: Diagnosis not present

## 2023-05-13 DIAGNOSIS — C679 Malignant neoplasm of bladder, unspecified: Secondary | ICD-10-CM | POA: Diagnosis present

## 2023-05-13 LAB — GLUCOSE, CAPILLARY
Glucose-Capillary: 123 mg/dL — ABNORMAL HIGH (ref 70–99)
Glucose-Capillary: 114 mg/dL — ABNORMAL HIGH (ref 70–99)

## 2023-05-13 SURGERY — TURBT, WITH CHEMOTHERAPEUTIC AGENT INSTILLATION INTO BLADDER
Anesthesia: General | Site: Bladder

## 2023-05-13 MED ORDER — FENTANYL CITRATE (PF) 100 MCG/2ML IJ SOLN
INTRAMUSCULAR | Status: AC
  Filled 2023-05-13: qty 2

## 2023-05-13 MED ORDER — CHLORHEXIDINE GLUCONATE 0.12 % MT SOLN
15.0000 mL | Freq: Once | OROMUCOSAL | Status: AC
  Administered 2023-05-13: 15 mL via OROMUCOSAL

## 2023-05-13 MED ORDER — FENTANYL CITRATE PF 50 MCG/ML IJ SOSY: 25.0000 ug | PREFILLED_SYRINGE | INTRAMUSCULAR | Status: DC | PRN

## 2023-05-13 MED ORDER — ORAL CARE MOUTH RINSE: 15.0000 mL | Freq: Once | OROMUCOSAL | Status: AC

## 2023-05-13 MED ORDER — PHENYLEPHRINE 80 MCG/ML (10ML) SYRINGE FOR IV PUSH (FOR BLOOD PRESSURE SUPPORT)
PREFILLED_SYRINGE | INTRAVENOUS | Status: AC
  Filled 2023-05-13: qty 10

## 2023-05-13 MED ORDER — DEXMEDETOMIDINE HCL IN NACL 80 MCG/20ML IV SOLN
INTRAVENOUS | Status: DC | PRN
  Administered 2023-05-13 (×2): 4 ug via INTRAVENOUS

## 2023-05-13 MED ORDER — SUGAMMADEX SODIUM 200 MG/2ML IV SOLN
INTRAVENOUS | Status: AC
  Filled 2023-05-13: qty 2

## 2023-05-13 MED ORDER — FENTANYL CITRATE (PF) 100 MCG/2ML IJ SOLN
INTRAMUSCULAR | Status: DC | PRN
Start: 2023-05-13 — End: 2023-05-13
  Administered 2023-05-13: 50 ug via INTRAVENOUS

## 2023-05-13 MED ORDER — LACTATED RINGERS IV SOLN: INTRAVENOUS | Status: DC

## 2023-05-13 MED ORDER — LIDOCAINE HCL (CARDIAC) PF 100 MG/5ML IV SOSY
PREFILLED_SYRINGE | INTRAVENOUS | Status: DC | PRN
  Administered 2023-05-13: 30 mg via INTRAVENOUS

## 2023-05-13 MED ORDER — ONDANSETRON HCL 4 MG/2ML IJ SOLN
INTRAMUSCULAR | Status: DC | PRN
  Administered 2023-05-13: 4 mg via INTRAVENOUS

## 2023-05-13 MED ORDER — TRAMADOL HCL 50 MG PO TABS: 50.0000 mg | ORAL_TABLET | Freq: Four times a day (QID) | ORAL | 0 refills | Status: DC | PRN

## 2023-05-13 MED ORDER — PROPOFOL 10 MG/ML IV BOLUS
INTRAVENOUS | Status: DC | PRN
  Administered 2023-05-13: 100 mg via INTRAVENOUS

## 2023-05-13 MED ORDER — CEFAZOLIN SODIUM-DEXTROSE 2-4 GM/100ML-% IV SOLN
2.0000 g | INTRAVENOUS | Status: AC
  Administered 2023-05-13: 2 g via INTRAVENOUS
  Filled 2023-05-13: qty 100

## 2023-05-13 MED ORDER — PHENYLEPHRINE HCL (PRESSORS) 10 MG/ML IV SOLN
INTRAVENOUS | Status: DC | PRN
  Administered 2023-05-13: 80 ug via INTRAVENOUS

## 2023-05-13 MED ORDER — GEMCITABINE CHEMO FOR BLADDER INSTILLATION 2000 MG
2000.0000 mg | Freq: Once | INTRAVENOUS | Status: AC
  Administered 2023-05-13: 2000 mg via INTRAVESICAL
  Filled 2023-05-13: qty 2000

## 2023-05-13 MED ORDER — EPHEDRINE SULFATE (PRESSORS) 50 MG/ML IJ SOLN
INTRAMUSCULAR | Status: DC | PRN
Start: 2023-05-13 — End: 2023-05-13
  Administered 2023-05-13: 5 mg via INTRAVENOUS

## 2023-05-13 MED ORDER — 0.9 % SODIUM CHLORIDE (POUR BTL) OPTIME
TOPICAL | Status: DC | PRN
  Administered 2023-05-13: 1000 mL

## 2023-05-13 MED ORDER — SUGAMMADEX SODIUM 200 MG/2ML IV SOLN
INTRAVENOUS | Status: DC | PRN
  Administered 2023-05-13: 200 mg via INTRAVENOUS

## 2023-05-13 MED ORDER — INSULIN ASPART 100 UNIT/ML IJ SOLN: 0.0000 [IU] | INTRAMUSCULAR | Status: DC | PRN

## 2023-05-13 MED ORDER — PROPOFOL 500 MG/50ML IV EMUL
INTRAVENOUS | Status: AC
  Filled 2023-05-13: qty 50

## 2023-05-13 MED ORDER — ROCURONIUM BROMIDE 100 MG/10ML IV SOLN
INTRAVENOUS | Status: DC | PRN
  Administered 2023-05-13: 4 mg via INTRAVENOUS

## 2023-05-13 MED ORDER — SODIUM CHLORIDE 0.9 % IR SOLN
Status: DC | PRN
  Administered 2023-05-13: 3000 mL

## 2023-05-13 MED ORDER — LIDOCAINE HCL (PF) 2 % IJ SOLN
INTRAMUSCULAR | Status: AC
  Filled 2023-05-13: qty 5

## 2023-05-13 MED ORDER — ONDANSETRON HCL 4 MG/2ML IJ SOLN
INTRAMUSCULAR | Status: AC
  Filled 2023-05-13: qty 2

## 2023-05-13 MED ORDER — EPHEDRINE 5 MG/ML INJ
INTRAVENOUS | Status: AC
  Filled 2023-05-13: qty 5

## 2023-05-13 MED ORDER — PROPOFOL 10 MG/ML IV BOLUS
INTRAVENOUS | Status: AC
  Filled 2023-05-13: qty 20

## 2023-05-13 SURGICAL SUPPLY — 18 items
BAG URINE DRAIN 2000ML AR STRL (UROLOGICAL SUPPLIES) IMPLANT
BAG URO CATCHER STRL LF (MISCELLANEOUS) ×1 IMPLANT
CATH SILICONE 5CC 18FR (INSTRUMENTS) IMPLANT
CLOTH BEACON ORANGE TIMEOUT ST (SAFETY) ×1 IMPLANT
DRAPE FOOT SWITCH (DRAPES) ×1 IMPLANT
ELECT REM PT RETURN 15FT ADLT (MISCELLANEOUS) IMPLANT
GLOVE BIO SURGEON STRL SZ 6.5 (GLOVE) ×1 IMPLANT
GOWN STRL REUS W/ TWL LRG LVL3 (GOWN DISPOSABLE) ×1 IMPLANT
KIT TURNOVER KIT A (KITS) IMPLANT
LOOP CUT BIPOLAR 24F LRG (ELECTROSURGICAL) IMPLANT
MANIFOLD NEPTUNE II (INSTRUMENTS) ×1 IMPLANT
PACK CYSTO (CUSTOM PROCEDURE TRAY) ×1 IMPLANT
PAD PREP 24X48 CUFFED NSTRL (MISCELLANEOUS) ×1 IMPLANT
PLUG CATH AND CAP STRL 200 (CATHETERS) IMPLANT
SYR TOOMEY IRRIG 70ML (MISCELLANEOUS) IMPLANT
SYRINGE TOOMEY IRRIG 70ML (MISCELLANEOUS) IMPLANT
TUBING CONNECTING 10 (TUBING) ×1 IMPLANT
TUBING UROLOGY SET (TUBING) ×1 IMPLANT

## 2023-05-13 NOTE — Anesthesia Postprocedure Evaluation (Signed)
 Anesthesia Post Note  Patient: [name]  Procedure(s) Performed: TRANSURETHRAL RESECTION OF BLADDER TUMOR (TURBT) with INTRAVESICAL GEMCITABINE (Bladder)     Patient location during evaluation: PACU Anesthesia Type: General Level of consciousness: awake and alert Pain management: pain level controlled Vital Signs Assessment: post-procedure vital signs reviewed and stable Respiratory status: spontaneous breathing, nonlabored ventilation and respiratory function stable Cardiovascular status: blood pressure returned to baseline and stable Postop Assessment: no apparent nausea or vomiting Anesthetic complications: no  No notable events documented.  Last Vitals:  Vitals:   05/13/23 1045 05/13/23 1100  BP: (!) 147/63 (!) 141/73  Pulse: 66 60  Resp: 14 12  Temp: 36.5 C   SpO2: 91% 92%    Last Pain:  Vitals:   05/13/23 1100  TempSrc:   PainSc: 0-No pain                 Alonni Heimsoth,W. EDMOND

## 2023-05-13 NOTE — Op Note (Addendum)
 Operative Note  Preoperative diagnosis:  1.  Bladder cancer  Postoperative diagnosis: 1.  Bladder cancer  Procedure(s): 1.  Transurethral resection of bladder tumor (0.5 cm small) 2.  Intravesical gemcitabine  Surgeon: Kasandra Knudsen, MD  Assistants:  None  Anesthesia:  General  Complications:  None  EBL: None  Specimens: 1. Bladder tumor  Drains/Catheters: 1.  18 French Foley catheter  Intraoperative findings:   Pelvic organ prolapse Normal urethra Bilateral orthotopic ureteral orifices Papillary bladder mass left lateral wall and additional mass inferior posterior wall Cystitis cystica surrounding trigone  Indication:  Marcia Norris is a 78 y.o. female with a history of bladder cancer, frequent UTIs and pelvic organ prolapse.  She underwent cystoscopy last month that showed a small bladder tumor concerning for bladder cancer recurrence.  Description of procedure:  After risks and benefits of the procedure discussed with the patient, informed consent was obtained.  The patient taken to the operating placed in supine position.  Anesthesia was induced and antibiotics were administered.  She was then repositioned in dorsolithotomy position.  The patient was prepped and draped in the usual sterile fashion and timeout was performed.  Using the visual obturator the resectoscope was placed in the urethral meatus and advanced into the bladder under direct visualization.  The visual obturator was removed and the bipolar electrocautery loop was assembled.  The previously identified bladder tumor was again seen on the left lateral wall.  This was then resected with the bipolar loop.  An additional growth was seen on the inferior bladder wall and was resected as well.  Fulguration then took place of both resection areas as well as the posterior inferior bladder wall that had abnormal bladder mucosa and erythema.  Hemostasis was adequate with the irrigant turned off.  The bladder  specimens were removed via the resectoscope.  Patient's bladder was decompressed and 18 French Foley catheter was placed in the bladder and inflated with 10 cc sterile water.  The patient emerged from anesthesia and sent to the PACU in stable condition.  In the PACU 2 g of intravesical gemcitabine were placed via the Foley catheter.  It was capped and held in place for 1 hour.  Plan: Drain gemcitabine and patient have void trial prior to discharge.

## 2023-05-13 NOTE — Anesthesia Procedure Notes (Signed)
 Procedure Name: Intubation Date/Time: 05/13/2023 9:43 AM  Performed by: Garth Bigness, CRNAPre-anesthesia Checklist: Patient identified, Emergency Drugs available, Suction available and Patient being monitored Patient Re-evaluated:Patient Re-evaluated prior to induction Oxygen Delivery Method: Circle system utilized Preoxygenation: Pre-oxygenation with 100% oxygen Induction Type: IV induction Ventilation: Mask ventilation without difficulty Laryngoscope Size: Mac and 3 Grade View: Grade I Tube type: Oral Number of attempts: 1 Airway Equipment and Method: Stylet Placement Confirmation: ETT inserted through vocal cords under direct vision, positive ETCO2 and breath sounds checked- equal and bilateral Secured at: 22 cm Tube secured with: Tape Dental Injury: Teeth and Oropharynx as per pre-operative assessment

## 2023-05-13 NOTE — Discharge Instructions (Signed)
 Transurethral Resection of Bladder Tumor (TURBT)   Definition:  Transurethral Resection of the Bladder Tumor is a surgical procedure used to diagnose and remove tumors within the bladder. TURBT is the most common treatment for early stage bladder cancer.  General instructions:     Your recent bladder surgery requires very little post hospital care but some definite precautions.  Despite the fact that no skin incisions were used, the area around the bladder incisions are raw and covered with scabs to promote healing and prevent bleeding. Certain precautions are needed to insure that the scabs are not disturbed over the next 2-4 weeks while the healing proceeds.  Because the raw surface inside your bladder and the irritating effects of urine you may expect frequency of urination and/or urgency (a stronger desire to urinate) and perhaps even getting up at night more often. This will usually resolve or improve slowly over the healing period. You may see some blood in your urine over the first 6 weeks. Do not be alarmed, even if the urine was clear for a while. Get off your feet and drink lots of fluids until clearing occurs. If you start to pass clots or don't improve call us.  Catheter: (If you are discharged with a catheter.)  1. Keep your catheter secured to your leg at all times with tape or the supplied strap. 2. You may experience leakage of urine around your catheter- as long as the  catheter continues to drain, this is normal.  If your catheter stops draining  go to the ER. 3. You may also have blood in your urine, even after it has been clear for  several days; you may even pass some small blood clots or other material.  This  is normal as well.  If this happens, sit down and drink plenty of water to help  make urine to flush out your bladder.  If the blood in your urine becomes worse  after doing this, contact our office or return to the ER. 4. You may use the leg bag (small bag)  during the day, but use the large bag at  night.  Diet:  You may return to your normal diet immediately. Because of the raw surface of your bladder, alcohol, spicy foods, foods high in acid and drinks with caffeine may cause irritation or frequency and should be used in moderation. To keep your urine flowing freely and avoid constipation, drink plenty of fluids during the day (8-10 glasses). Tip: Avoid cranberry juice because it is very acidic.  Activity:  Your physical activity doesn't need to be restricted. However, if you are very active, you may see some blood in the urine. We suggest that you reduce your activity under the circumstances until the bleeding has stopped.  Bowels:  It is important to keep your bowels regular during the postoperative period. Straining with bowel movements can cause bleeding. A bowel movement every other day is reasonable. Use a mild laxative if needed, such as milk of magnesia 2-3 tablespoons, or 2 Dulcolax tablets. Call if you continue to have problems. If you had been taking narcotics for pain, before, during or after your surgery, you may be constipated. Take a laxative if necessary.    Medication:  You should resume your pre-surgery medications unless told not to. In addition you may be given an antibiotic to prevent or treat infection. Antibiotics are not always necessary. All medication should be taken as prescribed until the bottles are finished unless you are having  an unusual reaction to one of the drugs.

## 2023-05-13 NOTE — Transfer of Care (Signed)
 Immediate Anesthesia Transfer of Care Note  Patient: Marcia Norris  Procedure(s) Performed: TRANSURETHRAL RESECTION OF BLADDER TUMOR (TURBT) with INTRAVESICAL GEMCITABINE (Bladder)  Patient Location: PACU  Anesthesia Type:General  Level of Consciousness: oriented, drowsy, and patient cooperative  Airway & Oxygen Therapy: Patient Spontanous Breathing and Patient connected to face mask oxygen  Post-op Assessment: Report given to RN and Post -op Vital signs reviewed and stable  Post vital signs: Reviewed and stable  Last Vitals:  Vitals Value Taken Time  BP 170/83 05/13/23 1013  Temp    Pulse 73 05/13/23 1014  Resp 18 05/13/23 1014  SpO2 100 % 05/13/23 1014  Vitals shown include unfiled device data.  Last Pain:  Vitals:   05/13/23 0847  TempSrc:   PainSc: 0-No pain         Complications: No notable events documented.

## 2023-05-13 NOTE — Interval H&P Note (Signed)
 History and Physical Interval Note:  05/13/2023 9:02 AM  Marcia Norris  has presented today for surgery, with the diagnosis of BLADDER CANCER.  The various methods of treatment have been discussed with the patient and family. After consideration of risks, benefits and other options for treatment, the patient has consented to  Procedure(s) with comments: TRANSURETHRAL RESECTION OF BLADDER TUMOR (TURBT) with INTRAVESICAL GEMCITABINE (N/A) - 45 MINUTES NEEDED as a surgical intervention.  The patient's history has been reviewed, patient examined, no change in status, stable for surgery.  I have reviewed the patient's chart and labs.  Questions were answered to the patient's satisfaction.     Hisham Provence D Lacey Dotson

## 2023-05-14 LAB — SURGICAL PATHOLOGY

## 2023-05-30 LAB — EXTERNAL GENERIC LAB PROCEDURE: COLOGUARD: NEGATIVE

## 2023-05-30 LAB — COLOGUARD: COLOGUARD: NEGATIVE

## 2023-06-30 ENCOUNTER — Ambulatory Visit: Attending: Internal Medicine | Admitting: Internal Medicine

## 2023-06-30 ENCOUNTER — Encounter: Payer: Self-pay | Admitting: Internal Medicine

## 2023-06-30 VITALS — BP 136/70 | HR 75 | Ht 66.0 in | Wt 165.8 lb

## 2023-06-30 DIAGNOSIS — I447 Left bundle-branch block, unspecified: Secondary | ICD-10-CM
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
## 2023-06-30 NOTE — Patient Instructions (Signed)
 Medication Instructions:   *If you need a refill on your cardiac medications before your next appointment, please call your pharmacy*  Lab Work:  If you have labs (blood work) drawn today and your tests are completely normal, you will receive your results only by: MyChart Message (if you have MyChart) OR A paper copy in the mail If you have any lab test that is abnormal or we need to change your treatment, we will call you to review the results.  Testing/Procedures:   Follow-Up: At [facility], you and your health needs are our priority.  As part of our continuing mission to provide you with exceptional heart care, our providers are all part of one team.  This team includes your primary Cardiologist (physician) and Advanced Practice Providers or APPs (Physician Assistants and Nurse Practitioners) who all work together to provide you with the care you need, when you need it.  Your next appointment:  one year  We recommend signing up for the patient portal called "MyChart".  Sign up information is provided on this After Visit Summary.  MyChart is used to connect with patients for Virtual Visits (Telemedicine).  Patients are able to view lab/test results, encounter notes, upcoming appointments, etc.  Non-urgent messages can be sent to your provider as well.   To learn more about what you can do with MyChart, go to [email or web address].   Other Instructions

## 2023-07-01 ENCOUNTER — Encounter: Payer: Self-pay | Admitting: Internal Medicine

## 2023-08-02 ENCOUNTER — Other Ambulatory Visit: Payer: Self-pay

## 2023-08-02 MED ORDER — VALSARTAN 80 MG PO TABS: 80.0000 mg | ORAL_TABLET | Freq: Every day | ORAL | 3 refills | Status: AC

## 2023-08-06 ENCOUNTER — Ambulatory Visit: Payer: Medicare Other | Admitting: Obstetrics

## 2023-09-17 ENCOUNTER — Ambulatory Visit: Admitting: Internal Medicine

## 2024-02-16 ENCOUNTER — Ambulatory Visit: Payer: No Typology Code available for payment source | Admitting: Podiatry

## 2024-02-16 ENCOUNTER — Ambulatory Visit (INDEPENDENT_AMBULATORY_CARE_PROVIDER_SITE_OTHER)

## 2024-02-16 ENCOUNTER — Encounter: Payer: Self-pay | Admitting: Podiatry

## 2024-02-16 DIAGNOSIS — M722 Plantar fascial fibromatosis: Secondary | ICD-10-CM | POA: Diagnosis not present

## 2024-02-16 MED ORDER — MELOXICAM 15 MG PO TABS: 15.0000 mg | ORAL_TABLET | Freq: Every day | ORAL | 0 refills | Status: DC
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
## 2024-02-16 NOTE — Patient Instructions (Signed)

## 2024-03-13 ENCOUNTER — Encounter: Payer: Self-pay | Admitting: *Deleted

## 2024-03-15 ENCOUNTER — Other Ambulatory Visit: Payer: Self-pay | Admitting: Podiatry

## 2024-03-18 ENCOUNTER — Encounter: Payer: Self-pay | Admitting: Internal Medicine

## 2024-03-22 ENCOUNTER — Telehealth: Payer: Self-pay | Admitting: Podiatry

## 2024-03-22 NOTE — Telephone Encounter (Signed)
 Attempted to call back Marcia Norris but there is no voicemail set up to leave them a message. She called this morning to reschedule an appt from 03/29/24 w/ Dr. Sikora. An error was made setting up that appt & it needs to be rescheduled again. The doctor has limited availability for the next several weeks but a spot can be opened up on 04/04/24 at 1015 am. The appt for 2/4 has been cancelled for incorrect visit type.

## 2024-03-29 ENCOUNTER — Ambulatory Visit: Admitting: Podiatry

## 2024-04-05 ENCOUNTER — Ambulatory Visit (INDEPENDENT_AMBULATORY_CARE_PROVIDER_SITE_OTHER): Admitting: Podiatry

## 2024-04-05 ENCOUNTER — Encounter: Payer: Self-pay | Admitting: Podiatry

## 2024-04-05 DIAGNOSIS — M722 Plantar fascial fibromatosis: Secondary | ICD-10-CM | POA: Diagnosis not present

## 2024-04-05 MED ORDER — MELOXICAM 15 MG PO TABS: 15.0000 mg | ORAL_TABLET | Freq: Every day | ORAL | 0 refills | Status: AC | PRN
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
## 2024-04-07 ENCOUNTER — Other Ambulatory Visit: Payer: Self-pay | Admitting: Podiatry

## 2024-06-29 ENCOUNTER — Ambulatory Visit: Admitting: Internal Medicine

## 2025-04-04 ENCOUNTER — Ambulatory Visit: Admitting: Podiatry
